# Patient Record
Sex: Female | Born: 1950
Health system: Southern US, Community
[De-identification: ages and names within clinical notes are randomized; demographics above are authoritative.]

## PROBLEM LIST (undated history)

## (undated) DIAGNOSIS — I10 Essential (primary) hypertension: Secondary | ICD-10-CM

## (undated) DIAGNOSIS — J45909 Unspecified asthma, uncomplicated: Secondary | ICD-10-CM

## (undated) DIAGNOSIS — K219 Gastro-esophageal reflux disease without esophagitis: Secondary | ICD-10-CM

## (undated) DIAGNOSIS — K649 Unspecified hemorrhoids: Secondary | ICD-10-CM

## (undated) DIAGNOSIS — E119 Type 2 diabetes mellitus without complications: Secondary | ICD-10-CM

## (undated) DIAGNOSIS — K5730 Diverticulosis of large intestine without perforation or abscess without bleeding: Secondary | ICD-10-CM

## (undated) DIAGNOSIS — N189 Chronic kidney disease, unspecified: Secondary | ICD-10-CM

## (undated) DIAGNOSIS — M1990 Unspecified osteoarthritis, unspecified site: Secondary | ICD-10-CM

## (undated) DIAGNOSIS — B009 Herpesviral infection, unspecified: Secondary | ICD-10-CM

## (undated) DIAGNOSIS — R7303 Prediabetes: Secondary | ICD-10-CM

## (undated) DIAGNOSIS — N816 Rectocele: Secondary | ICD-10-CM

## (undated) DIAGNOSIS — F419 Anxiety disorder, unspecified: Secondary | ICD-10-CM

## (undated) DIAGNOSIS — Z9889 Other specified postprocedural states: Secondary | ICD-10-CM

## (undated) DIAGNOSIS — K5909 Other constipation: Secondary | ICD-10-CM

## (undated) DIAGNOSIS — F329 Major depressive disorder, single episode, unspecified: Secondary | ICD-10-CM

## (undated) DIAGNOSIS — G629 Polyneuropathy, unspecified: Secondary | ICD-10-CM

## (undated) DIAGNOSIS — R0600 Dyspnea, unspecified: Secondary | ICD-10-CM

## (undated) DIAGNOSIS — D649 Anemia, unspecified: Secondary | ICD-10-CM

## (undated) HISTORY — PX: BREAST SURGERY: SHX581

## (undated) HISTORY — DX: Gastro-esophageal reflux disease without esophagitis: K21.9

## (undated) HISTORY — DX: Herpesviral infection, unspecified: B00.9

## (undated) HISTORY — DX: Essential (primary) hypertension: I10

## (undated) HISTORY — PX: OTHER SURGICAL HISTORY: SHX169

## (undated) HISTORY — PX: CATARACT EXTRACTION: SUR2

## (undated) HISTORY — PX: VAGINAL HYSTERECTOMY: SUR661

---

## 1968-11-13 HISTORY — PX: HEMORRHOID SURGERY: SHX153

## 1968-11-13 HISTORY — PX: FRACTURE SURGERY: SHX138

## 2001-11-18 ENCOUNTER — Ambulatory Visit (HOSPITAL_COMMUNITY): Admission: RE | Admit: 2001-11-18 | Discharge: 2001-11-18 | Payer: Self-pay | Admitting: Internal Medicine

## 2007-01-17 ENCOUNTER — Ambulatory Visit (HOSPITAL_COMMUNITY): Admission: RE | Admit: 2007-01-17 | Discharge: 2007-01-17 | Payer: Self-pay | Admitting: General Surgery

## 2011-06-22 DIAGNOSIS — D369 Benign neoplasm, unspecified site: Secondary | ICD-10-CM | POA: Insufficient documentation

## 2011-07-06 ENCOUNTER — Encounter (INDEPENDENT_AMBULATORY_CARE_PROVIDER_SITE_OTHER): Payer: Self-pay | Admitting: Surgery

## 2011-07-06 ENCOUNTER — Ambulatory Visit (INDEPENDENT_AMBULATORY_CARE_PROVIDER_SITE_OTHER): Payer: BC Managed Care – PPO | Admitting: Surgery

## 2011-07-06 DIAGNOSIS — D369 Benign neoplasm, unspecified site: Secondary | ICD-10-CM

## 2011-07-06 NOTE — Patient Instructions (Signed)
We will arrange for surgery to remove the abnormality in your right breast

## 2011-07-06 NOTE — Progress Notes (Signed)
Chief Complaint  Patient presents with  . Breast Problem    HPI Candice [name] is a 60 y.o. female.  She presents with a diagnosis of a papilloma in the right breast. She had a recent mammogram done which showed a subtle abnormality, and a core biopsy was done. This showed a papilloma.  In 2008 she had a mass removed from the right breast circumareolar area. This was associated with nipple discharge and was thought to be a papilloma although pathology suggested features of a fibroadenoma.  There is no nipple discharge or any other breast symptoms currently noted. HPI  Past Medical History  Diagnosis Date  . Herpes   . Hypertension     Past Surgical History  Procedure Date  . Abdominal hysterectomy   . Breast surgery   . Hemorrhoid surgery 1970    History reviewed. No pertinent family history.  Social History History  Substance Use Topics  . Smoking status: Former Games developer  . Smokeless tobacco: Never Used  . Alcohol Use: No    Allergies  Allergen Reactions  . Codeine Nausea Only  . Sulfur Rash    Current Outpatient Prescriptions  Medication Sig Dispense Refill  . acyclovir (ZOVIRAX) 400 MG tablet Take 400 mg by mouth every 4 (four) hours while awake.        . celecoxib (CELEBREX) 200 MG capsule Take 200 mg by mouth 2 (two) times daily.        . clonazePAM (KLONOPIN) 0.5 MG tablet Take 0.5 mg by mouth as needed.        Marland Kitchen olmesartan-hydrochlorothiazide (BENICAR HCT) 20-12.5 MG per tablet Take 1 tablet by mouth daily.        . pantoprazole (PROTONIX) 40 MG tablet Take 40 mg by mouth as needed.          Review of Systems ROS.Her 12 point review of systems was completely negative except for some issues with her weight and mild hypertension  Blood pressure 124/80, pulse 60, temperature 97.8 F (36.6 C), temperature source Temporal, height 5\' 7"  (1.702 m), weight 202 lb 3.2 oz (91.717 kg).  Physical Exam Physical Exam GENERAL: The patient is alert, oriented, and  generally healthy-appearing, NAD. Mood and affect are normal.  HEENT: The head is normocephalic, the eyes nonicteric, the pupils were round regular and equal. EOMs are normal. Pharynx normal. Dentition good.  NECK: The neck is supple and there are no masses or thyromegaly.  LUNGS: Normal respirations and clear to auscultation.  HEART: Regular rhythm, with no murmurs rubs or gallops. Pulses are intact carotid dorsalis pedis and posterior tibial. No significant varicosities are noted.  BREASTS: The breasts are symmetric in appearance. There is a mild bruise in the upper outer quadrant of the right breast. There is no palpable mass or other abnormality. There is no nipple discharge.  ABDOMEN: Soft, flat, and nontender. No masses or organomegaly is noted. No hernias are noted. Bowel sounds are normal.  EXTREMITIES: Good range of motion, no edema.  Data Reviewed I have reviewed her mammogram reports and films and the pathologist report showing intraductal papilloma  Assessment    papilloma right breast upper outer quadrant    Plan    I think she'll need a needle guided excisional biopsy. I discussed that with the patient and reviewed the risks and complications. I think all questions have been answered. I have told her that this most likely is completely benign. It is similar to what we thought she had previously but I  think a different locations so not a recurrence of the previous papilloma.       Mattison Stuckey J 07/06/2011, 3:35 PM

## 2011-07-10 ENCOUNTER — Encounter (HOSPITAL_BASED_OUTPATIENT_CLINIC_OR_DEPARTMENT_OTHER)
Admission: RE | Admit: 2011-07-10 | Discharge: 2011-07-10 | Disposition: A | Discharge status: Home / Self Care | Payer: BC Managed Care – PPO | Source: Ambulatory Visit | Attending: Surgery | Admitting: Surgery

## 2011-07-10 LAB — BASIC METABOLIC PANEL
BUN: 19 mg/dL (ref 6–23)
CO2: 29 mEq/L (ref 19–32)
Calcium: 10 mg/dL (ref 8.4–10.5)
Chloride: 104 mEq/L (ref 96–112)
Creatinine, Ser: 0.66 mg/dL (ref 0.50–1.10)
GFR calc Af Amer: >60 mL/min (ref >60)
GFR calc non Af Amer: >60 mL/min (ref >60)
Glucose, Bld: 83 mg/dL (ref 70–99)
Potassium: 4.1 mEq/L (ref 3.5–5.1)
Sodium: 138 mEq/L (ref 135–145)

## 2011-07-13 ENCOUNTER — Ambulatory Visit (HOSPITAL_BASED_OUTPATIENT_CLINIC_OR_DEPARTMENT_OTHER)
Admission: RE | Admit: 2011-07-13 | Discharge: 2011-07-13 | Disposition: A | Discharge status: Home / Self Care | Payer: BC Managed Care – PPO | Source: Ambulatory Visit | Attending: Surgery | Admitting: Surgery

## 2011-07-13 ENCOUNTER — Other Ambulatory Visit (INDEPENDENT_AMBULATORY_CARE_PROVIDER_SITE_OTHER): Payer: Self-pay | Admitting: Surgery

## 2011-07-13 DIAGNOSIS — Z01812 Encounter for preprocedural laboratory examination: Secondary | ICD-10-CM | POA: Insufficient documentation

## 2011-07-13 DIAGNOSIS — I10 Essential (primary) hypertension: Secondary | ICD-10-CM | POA: Insufficient documentation

## 2011-07-13 DIAGNOSIS — E669 Obesity, unspecified: Secondary | ICD-10-CM | POA: Insufficient documentation

## 2011-07-13 DIAGNOSIS — D249 Benign neoplasm of unspecified breast: Secondary | ICD-10-CM | POA: Insufficient documentation

## 2011-07-13 DIAGNOSIS — Z01810 Encounter for preprocedural cardiovascular examination: Secondary | ICD-10-CM | POA: Insufficient documentation

## 2011-07-13 LAB — POCT HEMOGLOBIN-HEMACUE: Hemoglobin: 12.3 g/dL (ref 12.0–15.0)

## 2011-07-16 NOTE — Op Note (Signed)
  NAME:  Candice [name], Candice [name] NO.:  [id number]  MEDICAL RECORD NO.:  [id number]  LOCATION:                                 FACILITY:  PHYSICIAN:  Currie Paris, M.D.   DATE OF BIRTH:  DATE OF PROCEDURE:  07/13/2011 DATE OF DISCHARGE:                              OPERATIVE REPORT   PREOPERATIVE DIAGNOSIS:  Right breast mass, probable papilloma.  POSTOPERATIVE DIAGNOSIS:  Right breast mass, probable papilloma.  PROCEDURE:  Needle-guided excision, right breast mass.  SURGEON:  Currie Paris, MD  ANESTHESIA:  General.  CLINICAL HISTORY:  This is a 60 year old lady who had an abnormality in the right breast biopsied and this appeared to be papilloma, but excisional biopsy was recommended.  DESCRIPTION OF PROCEDURE:  The patient was seen in the holding area and she had no further questions.  We identified and marked the right breast as the operative site.  I reviewed the wire localization films.  The patient was taken to the operating room and after satisfactory general anesthesia had been obtained, the breast was prepped and draped and the time-out was performed.  The guidewire entered just lateral and inferior to the areolar margin and went superiorly.  It was close enough to the areolar margin that I thought a circumareolar incision would give adequate exposure and best cosmetic result.  I then made the curvilinear incision.  I elevated skin flap inferior laterally, so I could manipulate the guidewire into the wound.  I then took a somewhat cylindrical piece of tissue around the guidewire going from inferior to superior along the guidewire tract.  When it had gone a few centimeters, I was able to grasp the tissue for better traction and then completed the excision going beyond the tip of the guidewire. There was a little bit of ecchymosis along the lateral edge, so I made sure to take all of that because I thought this was the prior  biopsy tract.  I made sure everything was dry.  I irrigated.  I put in 0.25% plain Marcaine for postop pain relief.  I closed in layers with 3-0 Vicryl, 4- 0 Monocryl subcuticular, Steri-Strips, and Dermabond.  The patient tolerated the procedure well.  There were no operative complications. All counts were correct.     Currie Paris, M.D.     CJS/MEDQ  D:  07/13/2011  T:  07/13/2011  Job:  [id number]  cc:   Fara Chute, MD  Electronically Signed by Cyndia Bent M.D. on 07/16/2011 04:54:09 AM

## 2011-07-18 ENCOUNTER — Telehealth (INDEPENDENT_AMBULATORY_CARE_PROVIDER_SITE_OTHER): Payer: Self-pay | Admitting: General Surgery

## 2011-07-18 NOTE — Telephone Encounter (Signed)
Patient made aware that her path is benign. She will keep her follow up appt on 07/25/11 and call with any problems.

## 2011-07-18 NOTE — Telephone Encounter (Signed)
Left message for patient to call back and ask for me. Calling to make patient aware her path is benign.

## 2011-07-18 NOTE — Telephone Encounter (Signed)
Message copied by Liliana Cline on Tue Jul 18, 2011 10:19 AM ------      Message from: Currie Paris      Created: Sat Jul 15, 2011  9:13 AM       Tell her path is benign and as expected

## 2011-07-25 ENCOUNTER — Ambulatory Visit (INDEPENDENT_AMBULATORY_CARE_PROVIDER_SITE_OTHER): Payer: BC Managed Care – PPO | Admitting: Surgery

## 2011-07-25 ENCOUNTER — Encounter (INDEPENDENT_AMBULATORY_CARE_PROVIDER_SITE_OTHER): Payer: Self-pay | Admitting: Surgery

## 2011-07-25 VITALS — BP 140/90 | HR 70

## 2011-07-25 DIAGNOSIS — Z9889 Other specified postprocedural states: Secondary | ICD-10-CM

## 2011-07-25 NOTE — Patient Instructions (Signed)
We will see you back as needed. You should continue to have annual mammograms

## 2011-07-25 NOTE — Progress Notes (Signed)
NAME: Candice [name]                                            DOB: Feb 11, 1951 DATE: 07/25/2011                                                  MRN: [id number]  CC: Post op excision breast mass  HPI: This patient comes in for post op follow-up.Sheunderwent lumpectomy on 07/13/11. She feels that she is doing well.  PE: General: The patient appears to be healthy, NAD Breast: The incision is healing and there is no evidence of hematoma or infections  DATA REVIEWED: Pathology report shows papilloma with negative margins of excision  IMPRESSION: The patient is doing well S/P removal of right breast papilloma.    PLAN: RTC PRN

## 2011-10-31 ENCOUNTER — Encounter (INDEPENDENT_AMBULATORY_CARE_PROVIDER_SITE_OTHER): Payer: Self-pay | Admitting: Surgery

## 2011-11-28 ENCOUNTER — Encounter: Payer: Self-pay | Admitting: Internal Medicine

## 2012-10-09 ENCOUNTER — Encounter (INDEPENDENT_AMBULATORY_CARE_PROVIDER_SITE_OTHER): Payer: Self-pay | Admitting: *Deleted

## 2012-10-14 ENCOUNTER — Other Ambulatory Visit (INDEPENDENT_AMBULATORY_CARE_PROVIDER_SITE_OTHER): Payer: Self-pay | Admitting: *Deleted

## 2012-10-14 ENCOUNTER — Encounter (INDEPENDENT_AMBULATORY_CARE_PROVIDER_SITE_OTHER): Payer: Self-pay | Admitting: Internal Medicine

## 2012-10-14 ENCOUNTER — Ambulatory Visit (INDEPENDENT_AMBULATORY_CARE_PROVIDER_SITE_OTHER): Payer: BC Managed Care – PPO | Admitting: Internal Medicine

## 2012-10-14 ENCOUNTER — Telehealth (INDEPENDENT_AMBULATORY_CARE_PROVIDER_SITE_OTHER): Payer: Self-pay | Admitting: *Deleted

## 2012-10-14 VITALS — BP 142/60 | HR 84 | Temp 98.4°F | Ht 66.0 in | Wt 209.7 lb

## 2012-10-14 DIAGNOSIS — Z1211 Encounter for screening for malignant neoplasm of colon: Secondary | ICD-10-CM

## 2012-10-14 DIAGNOSIS — D509 Iron deficiency anemia, unspecified: Secondary | ICD-10-CM

## 2012-10-14 MED ORDER — PEG-KCL-NACL-NASULF-NA ASC-C 100 G PO SOLR: 1.0000 | Freq: Once | ORAL | Status: DC

## 2012-10-14 NOTE — Progress Notes (Signed)
Subjective:     Patient ID: Candice [name], female   DOB: [birth year], 61 y.o.   MRN: [id number]  HPI Referred to our office by Dr. Neita Carp for anemia/requesting EGD/Colonoscopy.  Appetite is good. No unintentional weight. No dysphagia.No epigastric pain. No acid reflux. No lower abdominal pain.  She has a BM daily. Usually has about 3. Stools are normal size. No melena or bright red rectal bleeding.  She tells me that sometimes she has to put her finger in her vagina to have a BM. She started the B12 shots on her own.  11/31/2013 H and H 9.6 and 29.8, MCV 82,  11/4/20213Iron binding 404, UIBC 372, Iron 32, Iron sat 8, Vitamin B12 greater than 1999, Folate greater than 19.9, Ferritin 9.   12/26/2007 H and H 13.1 and 39.3, MCV 94. Colonoscopy: average risk screening colonoscopy 07/27/2012:  Mild changes of melanosis coli, no evidence of colonic polyps. 5mm submucosal lipoma at hepatic flexure which was left along.  Review of Systems see hpi Current Outpatient Prescriptions  Medication Sig Dispense Refill  . cetirizine (ZYRTEC) 10 MG tablet Take 10 mg by mouth daily.      . cholecalciferol (VITAMIN D) 1000 UNITS tablet Take 1,000 Units by mouth daily.      . clonazePAM (KLONOPIN) 0.5 MG tablet Take 0.5 mg by mouth as needed.        Marland Kitchen co-enzyme Q-10 30 MG capsule Take 30 mg by mouth 3 (three) times daily.      . ferrous sulfate 325 (65 FE) MG tablet Take 325 mg by mouth daily with breakfast.      . fish oil-omega-3 fatty acids 1000 MG capsule Take 2 g by mouth daily.      . Multiple Vitamin (MULTIVITAMIN) tablet Take 1 tablet by mouth daily.      Marland Kitchen olmesartan-hydrochlorothiazide (BENICAR HCT) 20-12.5 MG per tablet Take 1 tablet by mouth daily.        Marland Kitchen orlistat (XENICAL) 120 MG capsule Take 120 mg by mouth 3 (three) times daily with meals.      . pantoprazole (PROTONIX) 40 MG tablet Take 40 mg by mouth as needed.         Past Medical History  Diagnosis Date  . Herpes   . Hypertension   .  GERD (gastroesophageal reflux disease)    Past Surgical History  Procedure Date  . Abdominal hysterectomy   . Hemorrhoid surgery 1970  . Breast surgery 01/17/2007    Right Dr Kendrick Ranch  . Breast biopsy 07/13/11    Right - Dr Jamey Ripa   Allergies  Allergen Reactions  . Codeine Nausea Only  . Sulfur Rash        Objective:   Physical Exam  Filed Vitals:   10/14/12 1511  BP: 142/60  Pulse: 84  Temp: 98.4 F (36.9 C)  Height: 5\' 6"  (1.676 m)  Weight: 209 lb 11.2 oz (95.119 kg)   Alert and oriented. Skin warm and dry. Oral mucosa is moist.   . Sclera anicteric, conjunctivae is pink. Thyroid not enlarged. No cervical lymphadenopathy. Lungs clear. Heart regular rate and rhythm.  Abdomen is soft. Bowel sounds are positive. No hepatomegaly. No abdominal masses felt. No tenderness.  No edema to lower extremities. Stool small amount: guaiac negative.     Assessment:   Iron deficiency Anemia: Colonic neoplasm needs to be ruled out. Colonoscopy. If normal EGD looking for PUD, carcinoma   Plan:    Colonoscopy/EGD .The risks and benefits such  as perforation, bleeding, and infection were reviewed with the patient and is agreeable.

## 2012-10-14 NOTE — Telephone Encounter (Signed)
Patient needs movi prep 

## 2012-10-14 NOTE — Patient Instructions (Addendum)
Colonoscopy/EGD with Dr. Rehman. The risks and benefits such as perforation, bleeding, and infection were reviewed with the patient and is agreeable.  

## 2012-10-21 ENCOUNTER — Encounter (HOSPITAL_COMMUNITY): Payer: Self-pay | Admitting: Pharmacy Technician

## 2012-10-21 ENCOUNTER — Telehealth (INDEPENDENT_AMBULATORY_CARE_PROVIDER_SITE_OTHER): Payer: Self-pay | Admitting: *Deleted

## 2012-10-21 NOTE — Telephone Encounter (Signed)
You saw patient on 10/14/12 and order TCS poss EGD, she is now questioning why she needs this if she just had it done in 2011, please call her at [phone]

## 2012-10-21 NOTE — Telephone Encounter (Signed)
I discussed this with patient. I advised her she needs a colonoscopy and EGD

## 2012-10-24 ENCOUNTER — Encounter (INDEPENDENT_AMBULATORY_CARE_PROVIDER_SITE_OTHER): Payer: Self-pay

## 2012-10-29 MED ORDER — SODIUM CHLORIDE 0.45 % IV SOLN
INTRAVENOUS | Status: DC
  Administered 2012-10-30: 13:00:00 via INTRAVENOUS

## 2012-10-30 ENCOUNTER — Encounter (HOSPITAL_COMMUNITY): Payer: Self-pay

## 2012-10-30 ENCOUNTER — Encounter (HOSPITAL_COMMUNITY)
Admission: RE | Disposition: A | Discharge status: Home / Self Care | Payer: Self-pay | Source: Ambulatory Visit | Attending: Internal Medicine

## 2012-10-30 ENCOUNTER — Ambulatory Visit (HOSPITAL_COMMUNITY)
Admission: RE | Admit: 2012-10-30 | Discharge: 2012-10-30 | Disposition: A | Discharge status: Home / Self Care | Payer: BC Managed Care – PPO | Source: Ambulatory Visit | Attending: Internal Medicine | Admitting: Internal Medicine

## 2012-10-30 DIAGNOSIS — K219 Gastro-esophageal reflux disease without esophagitis: Secondary | ICD-10-CM | POA: Insufficient documentation

## 2012-10-30 DIAGNOSIS — K644 Residual hemorrhoidal skin tags: Secondary | ICD-10-CM

## 2012-10-30 DIAGNOSIS — D509 Iron deficiency anemia, unspecified: Secondary | ICD-10-CM

## 2012-10-30 DIAGNOSIS — I10 Essential (primary) hypertension: Secondary | ICD-10-CM | POA: Insufficient documentation

## 2012-10-30 DIAGNOSIS — K449 Diaphragmatic hernia without obstruction or gangrene: Secondary | ICD-10-CM | POA: Insufficient documentation

## 2012-10-30 DIAGNOSIS — D1779 Benign lipomatous neoplasm of other sites: Secondary | ICD-10-CM | POA: Insufficient documentation

## 2012-10-30 DIAGNOSIS — K6389 Other specified diseases of intestine: Secondary | ICD-10-CM

## 2012-10-30 HISTORY — PX: COLONOSCOPY: SHX5424

## 2012-10-30 HISTORY — PX: ESOPHAGOGASTRODUODENOSCOPY: SHX5428

## 2012-10-30 SURGERY — COLONOSCOPY
Anesthesia: Moderate Sedation

## 2012-10-30 MED ORDER — STERILE WATER FOR IRRIGATION IR SOLN
Status: DC | PRN
  Administered 2012-10-30: 14:00:00

## 2012-10-30 MED ORDER — MEPERIDINE HCL 50 MG/ML IJ SOLN
INTRAMUSCULAR | Status: DC | PRN
  Administered 2012-10-30 (×4): 25 mg via INTRAVENOUS

## 2012-10-30 MED ORDER — CELECOXIB 100 MG PO CAPS: 100.0000 mg | ORAL_CAPSULE | Freq: Every day | ORAL | Status: DC | PRN

## 2012-10-30 MED ORDER — BUTAMBEN-TETRACAINE-BENZOCAINE 2-2-14 % EX AERO
INHALATION_SPRAY | CUTANEOUS | Status: DC | PRN
  Administered 2012-10-30: 2 via TOPICAL

## 2012-10-30 MED ORDER — MEPERIDINE HCL 50 MG/ML IJ SOLN
INTRAMUSCULAR | Status: AC
  Filled 2012-10-30: qty 1

## 2012-10-30 MED ORDER — MIDAZOLAM HCL 5 MG/5ML IJ SOLN
INTRAMUSCULAR | Status: DC | PRN
  Administered 2012-10-30 (×5): 2 mg via INTRAVENOUS

## 2012-10-30 MED ORDER — MIDAZOLAM HCL 5 MG/5ML IJ SOLN
INTRAMUSCULAR | Status: AC
  Filled 2012-10-30: qty 10

## 2012-10-30 NOTE — H&P (Signed)
Candice [name] is an 61 y.o. female.   Chief Complaint: Patient is here for colonoscopy to be followed by EGD if colonoscopy is negative. HPI: Patient 51-year-old African female who presents with iron deficiency anemia. She did have colonoscopy by me over 2 years ago and was normal. She is undergoing repeat examination to make sure nothing was missed on her last exam. She is on Xenical and generally has 3 soft stools a day. She denies abdominal pain melena or rectal bleeding. Patient had been on Celebrex until it was discontinued by Dr. Neita Carp. However she has not experienced any side effects with that medication.   Past Medical History  Diagnosis Date  . Herpes   . Hypertension   . GERD (gastroesophageal reflux disease)     Past Surgical History  Procedure Date  . Abdominal hysterectomy   . Hemorrhoid surgery 1970  . Breast surgery 01/17/2007    Right Dr Kendrick Ranch  . Breast biopsy 07/13/11    Right - Dr Jamey Ripa    History reviewed. No pertinent family history. Social History:  reports that she has quit smoking. She has never used smokeless tobacco. She reports that she does not drink alcohol or use illicit drugs.  Allergies:  Allergies  Allergen Reactions  . Codeine Nausea Only  . Sulfur Rash    Medications Prior to Admission  Medication Sig Dispense Refill  . cetirizine (ZYRTEC) 10 MG tablet Take 10 mg by mouth daily.      . cholecalciferol (VITAMIN D) 1000 UNITS tablet Take 1,000 Units by mouth daily.      . clonazePAM (KLONOPIN) 0.5 MG tablet Take 0.5 mg by mouth at bedtime as needed. Anxiety.      Marland Kitchen co-enzyme Q-10 30 MG capsule Take 30 mg by mouth 3 (three) times daily.      . ferrous sulfate 325 (65 FE) MG tablet Take 325 mg by mouth daily with breakfast.      . fish oil-omega-3 fatty acids 1000 MG capsule Take 2 g by mouth daily.      . Multiple Vitamin (MULTIVITAMIN) tablet Take 1 tablet by mouth daily.      Marland Kitchen olmesartan-hydrochlorothiazide (BENICAR HCT) 20-12.5 MG  per tablet Take 1 tablet by mouth daily.        Marland Kitchen orlistat (XENICAL) 120 MG capsule Take 120 mg by mouth 3 (three) times daily with meals.      . pantoprazole (PROTONIX) 40 MG tablet Take 40 mg by mouth daily. Heartburn      . peg 3350 powder (MOVIPREP) 100 G SOLR Take 1 kit (100 g total) by mouth once.  1 kit  0  . PROAIR HFA 108 (90 BASE) MCG/ACT inhaler Take 2 puffs by mouth 4 (four) times daily.         No results found for this or any previous visit (from the past 48 hour(s)). No results found.  ROS  Blood pressure 139/83, pulse 97, temperature 98.3 F (36.8 C), temperature source Oral, resp. rate 15, SpO2 100.00%. Physical Exam  Constitutional: She appears well-developed and well-nourished.  HENT:  Mouth/Throat: Oropharynx is clear and moist.  Eyes: Conjunctivae normal are normal. No scleral icterus.  Neck: No thyromegaly present.  Cardiovascular: Normal rate, regular rhythm and normal heart sounds.   No murmur heard. Respiratory: Effort normal and breath sounds normal.  GI: Soft. She exhibits no distension and no mass. There is no tenderness.  Musculoskeletal: She exhibits no edema.  Lymphadenopathy:    She has  no cervical adenopathy.  Neurological: She is alert.  Skin: Skin is warm and dry.     Assessment/Plan Iron deficiency anemia. Colonoscopy followed by EGD if colonoscopy is negative.  Jennika Ringgold U 10/30/2012, 1:49 PM

## 2012-10-30 NOTE — Progress Notes (Signed)
Discharge instructions given by Lonn Georgia RN.

## 2012-10-30 NOTE — Op Note (Signed)
EGD PROCEDURE REPORT  PATIENT:  Candice [name]  MR#:  [id number] Birthdate:  01-28-1951, 61 y.o., female Endoscopist:  Dr. Malissa Hippo, MD Referred By:  Dr. Fara Chute, MD Procedure Date: 10/30/2012  Procedure:    Colonoscopy followed by esophagogastroduodenoscopy.  Indications:  Patient is 61 year old African female with iron deficiency anemia. She has chronic GERD and her symptoms are well controlled with PPI. She had been on NSAID until was discontinued by Dr. Neita Carp. Her stool when she was recently seen in the office was guaiac negative. She did have screening colonoscopy in September 2011 was normal. She is undergoing colonoscopy followed by EGD if no lesion is found on colonoscopy.            Informed Consent:  The risks, benefits, alternatives & imponderables which include, but are not limited to, bleeding, infection, perforation, drug reaction and potential missed lesion have been reviewed.  The potential for biopsy, lesion removal, esophageal dilation, etc. have also been discussed.  Questions have been answered.  All parties agreeable.  Please see history & physical in medical record for more information.  Medications:  Demerol 100 mg IV Versed 10 mg IV Cetacaine spray topically for oropharyngeal anesthesia     COLONOSCOPY Description of procedure:  After a digital rectal exam was performed, that colonoscope was advanced from the anus through the rectum and colon to the area of the cecum, ileocecal valve and appendiceal orifice. The cecum was deeply intubated. These structures were well-seen and photographed for the record. From the level of the cecum and ileocecal valve, the scope was slowly and cautiously withdrawn. The mucosal surfaces were carefully surveyed utilizing scope tip to flexion to facilitate fold flattening as needed. The scope was pulled down into the rectum where a thorough exam including retroflexion was performed.  Findings:   Prep satisfactory. Small  submucosal lipoma at hepatic flexure. Normal mucosa throughout. No ulcers or other mucosal abnormalities noted. Normal rectal mucosa. Small hemorrhoids below the dentate line.  Therapeutic/Diagnostic Maneuvers Performed:  None  Complications:  None  Cecal Withdrawal Time:  8 minutes EGD  Description of procedure:  The endoscope was introduced through the mouth and advanced to the second portion of the duodenum without difficulty or limitations. The mucosal surfaces were surveyed very carefully during advancement of the scope and upon withdrawal.  Findings:  Esophagus:  Normal mucosa throughout. GEJ:  30 cm Hiatus:  36 cm Stomach:  , Was empty and distended very well with insufflation. Folds in the proximal stomach were normal. Examination mucosa revealed few hyperplastic polyps the gastric body which are left alone. No erosions or ulcers noted. Pyloric channel was patent. Angularis fundus and cardia were examined by retroflexing the scope over normal. Duodenum:  Normal bulbar and post bulbar mucosa.  Therapeutic/Diagnostic Maneuvers Performed:  None  Impression:  Normal colonoscopy except small submucosal lipoma at hepatic flexure and external hemorrhoids. Moderate size sliding hiatal hernia without evidence of erosive esophagitis. Few hyperplastic-appearing polyps in gastric body. These were left alone. No evidence of peptic ulcer disease. It is possible that she has impaired iron absorption while on PPI on may have been losing blood from small bowel(NSAID enteropathy).  Recommendations:  Patient will resume her usual medications including fat is sulfate. She will go back on Celebrex 100 mg by mouth daily when necessary. She will have H&H and serum ferritin in 8 weeks. If her H&H remains low or she has evidence of active bleeding will consider small bowel given capsule study.  Candice [name],Candice [name]  10/30/2012 2:51 PM  CC: Dr. Estanislado Pandy, MD & Dr. Bonnetta Barry ref. provider  found

## 2012-11-01 ENCOUNTER — Encounter (HOSPITAL_COMMUNITY): Payer: Self-pay | Admitting: Internal Medicine

## 2012-12-23 ENCOUNTER — Telehealth (INDEPENDENT_AMBULATORY_CARE_PROVIDER_SITE_OTHER): Payer: Self-pay | Admitting: *Deleted

## 2012-12-23 NOTE — Telephone Encounter (Addendum)
Candice [name] had a procedure done in December by Dr. Karilyn Cota and on the discharge paper it had for her to have a H&H serum ferritin. She would like to know what this is? Candice [name] also said this was written on rx paper. Told Candice [name] it was for her to have lab work done. Would like to speak with Tammy and the return phone number is [phone].

## 2012-12-24 ENCOUNTER — Telehealth (INDEPENDENT_AMBULATORY_CARE_PROVIDER_SITE_OTHER): Payer: Self-pay | Admitting: *Deleted

## 2012-12-24 DIAGNOSIS — D509 Iron deficiency anemia, unspecified: Secondary | ICD-10-CM

## 2012-12-24 NOTE — Telephone Encounter (Signed)
Labs that were ordered at time of procedure

## 2012-12-24 NOTE — Telephone Encounter (Signed)
Lab order faxed to [facility]. Patient called and a message was left on the patient's voicemail at home.

## 2012-12-27 LAB — HEMOGLOBIN AND HEMATOCRIT, BLOOD
HCT: 39.2 % (ref 36.0–46.0)
Hemoglobin: 12.9 g/dL (ref 12.0–15.0)

## 2012-12-28 LAB — FERRITIN: Ferritin: 23 ng/mL (ref 10–291)

## 2014-03-17 DIAGNOSIS — F411 Generalized anxiety disorder: Secondary | ICD-10-CM | POA: Diagnosis not present

## 2014-03-17 DIAGNOSIS — R7309 Other abnormal glucose: Secondary | ICD-10-CM | POA: Diagnosis not present

## 2014-03-17 DIAGNOSIS — E7800 Pure hypercholesterolemia, unspecified: Secondary | ICD-10-CM | POA: Diagnosis not present

## 2014-03-17 DIAGNOSIS — I10 Essential (primary) hypertension: Secondary | ICD-10-CM | POA: Diagnosis not present

## 2014-03-17 DIAGNOSIS — K2100 Gastro-esophageal reflux disease with esophagitis, without bleeding: Secondary | ICD-10-CM | POA: Diagnosis not present

## 2014-03-17 DIAGNOSIS — E049 Nontoxic goiter, unspecified: Secondary | ICD-10-CM | POA: Diagnosis not present

## 2014-03-17 DIAGNOSIS — E669 Obesity, unspecified: Secondary | ICD-10-CM | POA: Diagnosis not present

## 2014-03-19 DIAGNOSIS — E042 Nontoxic multinodular goiter: Secondary | ICD-10-CM | POA: Diagnosis not present

## 2014-03-23 DIAGNOSIS — E041 Nontoxic single thyroid nodule: Secondary | ICD-10-CM | POA: Diagnosis not present

## 2014-04-03 DIAGNOSIS — R300 Dysuria: Secondary | ICD-10-CM | POA: Diagnosis not present

## 2014-04-03 DIAGNOSIS — J040 Acute laryngitis: Secondary | ICD-10-CM | POA: Diagnosis not present

## 2014-07-30 DIAGNOSIS — Z23 Encounter for immunization: Secondary | ICD-10-CM | POA: Diagnosis not present

## 2015-02-12 DIAGNOSIS — E780 Pure hypercholesterolemia: Secondary | ICD-10-CM | POA: Diagnosis not present

## 2015-02-12 DIAGNOSIS — D539 Nutritional anemia, unspecified: Secondary | ICD-10-CM | POA: Diagnosis not present

## 2015-02-12 DIAGNOSIS — F331 Major depressive disorder, recurrent, moderate: Secondary | ICD-10-CM | POA: Diagnosis not present

## 2015-02-12 DIAGNOSIS — R5383 Other fatigue: Secondary | ICD-10-CM | POA: Diagnosis not present

## 2015-03-12 DIAGNOSIS — Z1231 Encounter for screening mammogram for malignant neoplasm of breast: Secondary | ICD-10-CM | POA: Diagnosis not present

## 2015-04-02 DIAGNOSIS — K210 Gastro-esophageal reflux disease with esophagitis: Secondary | ICD-10-CM | POA: Diagnosis not present

## 2015-04-02 DIAGNOSIS — I10 Essential (primary) hypertension: Secondary | ICD-10-CM | POA: Diagnosis not present

## 2015-04-02 DIAGNOSIS — R739 Hyperglycemia, unspecified: Secondary | ICD-10-CM | POA: Diagnosis not present

## 2015-04-02 DIAGNOSIS — D508 Other iron deficiency anemias: Secondary | ICD-10-CM | POA: Diagnosis not present

## 2015-04-02 DIAGNOSIS — E049 Nontoxic goiter, unspecified: Secondary | ICD-10-CM | POA: Diagnosis not present

## 2015-04-02 DIAGNOSIS — E780 Pure hypercholesterolemia: Secondary | ICD-10-CM | POA: Diagnosis not present

## 2015-04-06 DIAGNOSIS — I10 Essential (primary) hypertension: Secondary | ICD-10-CM | POA: Diagnosis not present

## 2015-04-06 DIAGNOSIS — E6609 Other obesity due to excess calories: Secondary | ICD-10-CM | POA: Diagnosis not present

## 2015-04-06 DIAGNOSIS — F411 Generalized anxiety disorder: Secondary | ICD-10-CM | POA: Diagnosis not present

## 2015-04-06 DIAGNOSIS — Z0001 Encounter for general adult medical examination with abnormal findings: Secondary | ICD-10-CM | POA: Diagnosis not present

## 2015-04-06 DIAGNOSIS — Z1389 Encounter for screening for other disorder: Secondary | ICD-10-CM | POA: Diagnosis not present

## 2015-04-06 DIAGNOSIS — D508 Other iron deficiency anemias: Secondary | ICD-10-CM | POA: Diagnosis not present

## 2015-04-06 DIAGNOSIS — E782 Mixed hyperlipidemia: Secondary | ICD-10-CM | POA: Diagnosis not present

## 2015-04-07 DIAGNOSIS — I10 Essential (primary) hypertension: Secondary | ICD-10-CM | POA: Diagnosis not present

## 2015-05-08 DIAGNOSIS — I10 Essential (primary) hypertension: Secondary | ICD-10-CM | POA: Diagnosis not present

## 2015-06-07 DIAGNOSIS — I10 Essential (primary) hypertension: Secondary | ICD-10-CM | POA: Diagnosis not present

## 2015-07-08 DIAGNOSIS — I10 Essential (primary) hypertension: Secondary | ICD-10-CM | POA: Diagnosis not present

## 2015-08-08 DIAGNOSIS — I10 Essential (primary) hypertension: Secondary | ICD-10-CM | POA: Diagnosis not present

## 2015-08-09 DIAGNOSIS — Z23 Encounter for immunization: Secondary | ICD-10-CM | POA: Diagnosis not present

## 2015-09-17 DIAGNOSIS — I10 Essential (primary) hypertension: Secondary | ICD-10-CM | POA: Diagnosis not present

## 2015-09-17 DIAGNOSIS — K210 Gastro-esophageal reflux disease with esophagitis: Secondary | ICD-10-CM | POA: Diagnosis not present

## 2015-09-17 DIAGNOSIS — E782 Mixed hyperlipidemia: Secondary | ICD-10-CM | POA: Diagnosis not present

## 2015-09-30 DIAGNOSIS — J45909 Unspecified asthma, uncomplicated: Secondary | ICD-10-CM | POA: Diagnosis not present

## 2015-09-30 DIAGNOSIS — R739 Hyperglycemia, unspecified: Secondary | ICD-10-CM | POA: Diagnosis not present

## 2015-09-30 DIAGNOSIS — E782 Mixed hyperlipidemia: Secondary | ICD-10-CM | POA: Diagnosis not present

## 2015-09-30 DIAGNOSIS — I10 Essential (primary) hypertension: Secondary | ICD-10-CM | POA: Diagnosis not present

## 2015-10-05 DIAGNOSIS — I10 Essential (primary) hypertension: Secondary | ICD-10-CM | POA: Diagnosis not present

## 2015-10-14 DIAGNOSIS — H2513 Age-related nuclear cataract, bilateral: Secondary | ICD-10-CM | POA: Diagnosis not present

## 2015-10-14 DIAGNOSIS — H524 Presbyopia: Secondary | ICD-10-CM | POA: Diagnosis not present

## 2015-10-14 DIAGNOSIS — H5203 Hypermetropia, bilateral: Secondary | ICD-10-CM | POA: Diagnosis not present

## 2015-10-14 DIAGNOSIS — H52223 Regular astigmatism, bilateral: Secondary | ICD-10-CM | POA: Diagnosis not present

## 2016-04-07 DIAGNOSIS — K210 Gastro-esophageal reflux disease with esophagitis: Secondary | ICD-10-CM | POA: Diagnosis not present

## 2016-04-07 DIAGNOSIS — E049 Nontoxic goiter, unspecified: Secondary | ICD-10-CM | POA: Diagnosis not present

## 2016-04-07 DIAGNOSIS — R739 Hyperglycemia, unspecified: Secondary | ICD-10-CM | POA: Diagnosis not present

## 2016-04-07 DIAGNOSIS — D539 Nutritional anemia, unspecified: Secondary | ICD-10-CM | POA: Diagnosis not present

## 2016-04-07 DIAGNOSIS — I10 Essential (primary) hypertension: Secondary | ICD-10-CM | POA: Diagnosis not present

## 2016-04-07 DIAGNOSIS — E7800 Pure hypercholesterolemia, unspecified: Secondary | ICD-10-CM | POA: Diagnosis not present

## 2016-04-07 DIAGNOSIS — F411 Generalized anxiety disorder: Secondary | ICD-10-CM | POA: Diagnosis not present

## 2016-04-07 DIAGNOSIS — D508 Other iron deficiency anemias: Secondary | ICD-10-CM | POA: Diagnosis not present

## 2016-04-07 DIAGNOSIS — E782 Mixed hyperlipidemia: Secondary | ICD-10-CM | POA: Diagnosis not present

## 2016-04-13 DIAGNOSIS — E782 Mixed hyperlipidemia: Secondary | ICD-10-CM | POA: Diagnosis not present

## 2016-04-13 DIAGNOSIS — R739 Hyperglycemia, unspecified: Secondary | ICD-10-CM | POA: Diagnosis not present

## 2016-04-13 DIAGNOSIS — J45909 Unspecified asthma, uncomplicated: Secondary | ICD-10-CM | POA: Diagnosis not present

## 2016-04-13 DIAGNOSIS — N8110 Cystocele, unspecified: Secondary | ICD-10-CM | POA: Diagnosis not present

## 2016-04-13 DIAGNOSIS — Z23 Encounter for immunization: Secondary | ICD-10-CM | POA: Diagnosis not present

## 2016-04-13 DIAGNOSIS — I10 Essential (primary) hypertension: Secondary | ICD-10-CM | POA: Diagnosis not present

## 2016-04-13 DIAGNOSIS — Z0000 Encounter for general adult medical examination without abnormal findings: Secondary | ICD-10-CM | POA: Diagnosis not present

## 2016-07-11 ENCOUNTER — Other Ambulatory Visit: Payer: Self-pay

## 2016-07-20 DIAGNOSIS — Z23 Encounter for immunization: Secondary | ICD-10-CM | POA: Diagnosis not present

## 2016-09-14 DIAGNOSIS — Z01411 Encounter for gynecological examination (general) (routine) with abnormal findings: Secondary | ICD-10-CM | POA: Diagnosis not present

## 2016-09-14 DIAGNOSIS — N819 Female genital prolapse, unspecified: Secondary | ICD-10-CM | POA: Diagnosis not present

## 2016-09-14 DIAGNOSIS — N816 Rectocele: Secondary | ICD-10-CM | POA: Diagnosis not present

## 2016-09-14 DIAGNOSIS — N8110 Cystocele, unspecified: Secondary | ICD-10-CM | POA: Diagnosis not present

## 2016-09-19 DIAGNOSIS — M25562 Pain in left knee: Secondary | ICD-10-CM | POA: Diagnosis not present

## 2016-09-19 DIAGNOSIS — M1711 Unilateral primary osteoarthritis, right knee: Secondary | ICD-10-CM | POA: Diagnosis not present

## 2016-09-19 DIAGNOSIS — M170 Bilateral primary osteoarthritis of knee: Secondary | ICD-10-CM | POA: Diagnosis not present

## 2016-09-19 DIAGNOSIS — M25561 Pain in right knee: Secondary | ICD-10-CM | POA: Diagnosis not present

## 2016-09-27 DIAGNOSIS — M25561 Pain in right knee: Secondary | ICD-10-CM | POA: Diagnosis not present

## 2016-09-27 DIAGNOSIS — M1711 Unilateral primary osteoarthritis, right knee: Secondary | ICD-10-CM | POA: Diagnosis not present

## 2016-09-28 DIAGNOSIS — K5909 Other constipation: Secondary | ICD-10-CM | POA: Diagnosis not present

## 2016-10-04 DIAGNOSIS — M25561 Pain in right knee: Secondary | ICD-10-CM | POA: Diagnosis not present

## 2016-10-04 DIAGNOSIS — M1711 Unilateral primary osteoarthritis, right knee: Secondary | ICD-10-CM | POA: Diagnosis not present

## 2016-10-12 DIAGNOSIS — M1711 Unilateral primary osteoarthritis, right knee: Secondary | ICD-10-CM | POA: Diagnosis not present

## 2016-10-12 DIAGNOSIS — M25561 Pain in right knee: Secondary | ICD-10-CM | POA: Diagnosis not present

## 2016-10-12 DIAGNOSIS — N8110 Cystocele, unspecified: Secondary | ICD-10-CM | POA: Diagnosis not present

## 2016-10-13 DIAGNOSIS — Z6833 Body mass index (BMI) 33.0-33.9, adult: Secondary | ICD-10-CM | POA: Diagnosis not present

## 2016-10-13 DIAGNOSIS — M25531 Pain in right wrist: Secondary | ICD-10-CM | POA: Diagnosis not present

## 2016-10-13 DIAGNOSIS — J069 Acute upper respiratory infection, unspecified: Secondary | ICD-10-CM | POA: Diagnosis not present

## 2016-10-13 DIAGNOSIS — M67441 Ganglion, right hand: Secondary | ICD-10-CM | POA: Diagnosis not present

## 2016-10-17 DIAGNOSIS — R739 Hyperglycemia, unspecified: Secondary | ICD-10-CM | POA: Diagnosis not present

## 2016-10-17 DIAGNOSIS — E782 Mixed hyperlipidemia: Secondary | ICD-10-CM | POA: Diagnosis not present

## 2016-10-17 DIAGNOSIS — I10 Essential (primary) hypertension: Secondary | ICD-10-CM | POA: Diagnosis not present

## 2016-10-17 DIAGNOSIS — K210 Gastro-esophageal reflux disease with esophagitis: Secondary | ICD-10-CM | POA: Diagnosis not present

## 2016-10-17 DIAGNOSIS — R5383 Other fatigue: Secondary | ICD-10-CM | POA: Diagnosis not present

## 2016-10-19 DIAGNOSIS — M1711 Unilateral primary osteoarthritis, right knee: Secondary | ICD-10-CM | POA: Diagnosis not present

## 2016-10-19 DIAGNOSIS — M25561 Pain in right knee: Secondary | ICD-10-CM | POA: Diagnosis not present

## 2016-10-20 DIAGNOSIS — I10 Essential (primary) hypertension: Secondary | ICD-10-CM | POA: Diagnosis not present

## 2016-10-20 DIAGNOSIS — J209 Acute bronchitis, unspecified: Secondary | ICD-10-CM | POA: Diagnosis not present

## 2016-10-20 DIAGNOSIS — E782 Mixed hyperlipidemia: Secondary | ICD-10-CM | POA: Diagnosis not present

## 2016-10-20 DIAGNOSIS — N8110 Cystocele, unspecified: Secondary | ICD-10-CM | POA: Diagnosis not present

## 2016-10-20 DIAGNOSIS — K210 Gastro-esophageal reflux disease with esophagitis: Secondary | ICD-10-CM | POA: Diagnosis not present

## 2016-10-20 DIAGNOSIS — R739 Hyperglycemia, unspecified: Secondary | ICD-10-CM | POA: Diagnosis not present

## 2016-10-20 DIAGNOSIS — J45909 Unspecified asthma, uncomplicated: Secondary | ICD-10-CM | POA: Diagnosis not present

## 2016-10-20 DIAGNOSIS — F411 Generalized anxiety disorder: Secondary | ICD-10-CM | POA: Diagnosis not present

## 2016-10-27 DIAGNOSIS — K760 Fatty (change of) liver, not elsewhere classified: Secondary | ICD-10-CM | POA: Diagnosis not present

## 2016-10-27 DIAGNOSIS — R748 Abnormal levels of other serum enzymes: Secondary | ICD-10-CM | POA: Diagnosis not present

## 2016-11-10 DIAGNOSIS — Z1231 Encounter for screening mammogram for malignant neoplasm of breast: Secondary | ICD-10-CM | POA: Diagnosis not present

## 2017-03-21 DIAGNOSIS — M25561 Pain in right knee: Secondary | ICD-10-CM | POA: Diagnosis not present

## 2017-03-21 DIAGNOSIS — M1711 Unilateral primary osteoarthritis, right knee: Secondary | ICD-10-CM | POA: Diagnosis not present

## 2017-03-21 DIAGNOSIS — R262 Difficulty in walking, not elsewhere classified: Secondary | ICD-10-CM | POA: Diagnosis not present

## 2017-03-28 DIAGNOSIS — M25561 Pain in right knee: Secondary | ICD-10-CM | POA: Diagnosis not present

## 2017-03-28 DIAGNOSIS — M1711 Unilateral primary osteoarthritis, right knee: Secondary | ICD-10-CM | POA: Diagnosis not present

## 2017-04-04 DIAGNOSIS — M25561 Pain in right knee: Secondary | ICD-10-CM | POA: Diagnosis not present

## 2017-04-04 DIAGNOSIS — M1711 Unilateral primary osteoarthritis, right knee: Secondary | ICD-10-CM | POA: Diagnosis not present

## 2017-05-19 DIAGNOSIS — Z6832 Body mass index (BMI) 32.0-32.9, adult: Secondary | ICD-10-CM | POA: Diagnosis not present

## 2017-05-19 DIAGNOSIS — J0101 Acute recurrent maxillary sinusitis: Secondary | ICD-10-CM | POA: Diagnosis not present

## 2017-06-04 DIAGNOSIS — I10 Essential (primary) hypertension: Secondary | ICD-10-CM | POA: Diagnosis not present

## 2017-06-04 DIAGNOSIS — E782 Mixed hyperlipidemia: Secondary | ICD-10-CM | POA: Diagnosis not present

## 2017-06-04 DIAGNOSIS — R739 Hyperglycemia, unspecified: Secondary | ICD-10-CM | POA: Diagnosis not present

## 2017-06-04 DIAGNOSIS — K210 Gastro-esophageal reflux disease with esophagitis: Secondary | ICD-10-CM | POA: Diagnosis not present

## 2017-06-06 DIAGNOSIS — N8110 Cystocele, unspecified: Secondary | ICD-10-CM | POA: Diagnosis not present

## 2017-06-06 DIAGNOSIS — Z0000 Encounter for general adult medical examination without abnormal findings: Secondary | ICD-10-CM | POA: Diagnosis not present

## 2017-06-06 DIAGNOSIS — I10 Essential (primary) hypertension: Secondary | ICD-10-CM | POA: Diagnosis not present

## 2017-06-06 DIAGNOSIS — Z6833 Body mass index (BMI) 33.0-33.9, adult: Secondary | ICD-10-CM | POA: Diagnosis not present

## 2017-06-06 DIAGNOSIS — J209 Acute bronchitis, unspecified: Secondary | ICD-10-CM | POA: Diagnosis not present

## 2017-06-06 DIAGNOSIS — K210 Gastro-esophageal reflux disease with esophagitis: Secondary | ICD-10-CM | POA: Diagnosis not present

## 2017-06-06 DIAGNOSIS — J45909 Unspecified asthma, uncomplicated: Secondary | ICD-10-CM | POA: Diagnosis not present

## 2017-06-06 DIAGNOSIS — R739 Hyperglycemia, unspecified: Secondary | ICD-10-CM | POA: Diagnosis not present

## 2017-06-13 LAB — IFOBT (OCCULT BLOOD): IFOBT: POSITIVE

## 2017-06-21 ENCOUNTER — Encounter (INDEPENDENT_AMBULATORY_CARE_PROVIDER_SITE_OTHER): Payer: Self-pay | Admitting: Internal Medicine

## 2017-06-21 ENCOUNTER — Encounter (INDEPENDENT_AMBULATORY_CARE_PROVIDER_SITE_OTHER): Payer: Self-pay

## 2017-06-25 DIAGNOSIS — M722 Plantar fascial fibromatosis: Secondary | ICD-10-CM | POA: Diagnosis not present

## 2017-06-25 DIAGNOSIS — M79672 Pain in left foot: Secondary | ICD-10-CM | POA: Diagnosis not present

## 2017-06-25 DIAGNOSIS — M79671 Pain in right foot: Secondary | ICD-10-CM | POA: Diagnosis not present

## 2017-06-25 DIAGNOSIS — M25579 Pain in unspecified ankle and joints of unspecified foot: Secondary | ICD-10-CM | POA: Diagnosis not present

## 2017-06-28 DIAGNOSIS — N816 Rectocele: Secondary | ICD-10-CM | POA: Diagnosis not present

## 2017-07-05 ENCOUNTER — Encounter (INDEPENDENT_AMBULATORY_CARE_PROVIDER_SITE_OTHER): Payer: Self-pay | Admitting: *Deleted

## 2017-07-05 ENCOUNTER — Other Ambulatory Visit (INDEPENDENT_AMBULATORY_CARE_PROVIDER_SITE_OTHER): Payer: Self-pay | Admitting: Internal Medicine

## 2017-07-05 ENCOUNTER — Encounter (INDEPENDENT_AMBULATORY_CARE_PROVIDER_SITE_OTHER): Payer: Self-pay | Admitting: Internal Medicine

## 2017-07-05 ENCOUNTER — Telehealth (INDEPENDENT_AMBULATORY_CARE_PROVIDER_SITE_OTHER): Payer: Self-pay | Admitting: *Deleted

## 2017-07-05 ENCOUNTER — Ambulatory Visit (INDEPENDENT_AMBULATORY_CARE_PROVIDER_SITE_OTHER): Payer: Medicare Other | Admitting: Internal Medicine

## 2017-07-05 VITALS — BP 130/80 | HR 60 | Temp 97.8°F | Ht 66.5 in | Wt 202.8 lb

## 2017-07-05 DIAGNOSIS — R195 Other fecal abnormalities: Secondary | ICD-10-CM

## 2017-07-05 MED ORDER — PEG 3350-KCL-NA BICARB-NACL 420 G PO SOLR: 4000.0000 mL | Freq: Once | ORAL | 0 refills | Status: AC

## 2017-07-05 NOTE — Telephone Encounter (Signed)
Patient needs trilyte 

## 2017-07-05 NOTE — Patient Instructions (Addendum)
The risks of bleeding, perforation and infection were reviewed with patient.  

## 2017-07-05 NOTE — Progress Notes (Addendum)
Subjective:    Patient ID: Candice [name], female    DOB: December 25, 1950, 66 y.o.   MRN: [id number]   HPI Referred by Dr Quintin Alto for positive stool card. She denies seeing any blood.  Stools are brown in color. No melena. She tells me she has a rectal prolapse and is seeing a Psychologist, sport and exercise  At [facility].  Her appetite is good. No weight loss. Usually has a BM daily. She occasionally sees blood if she becomes constipated.  06/13/2017 Positive stool card.   10/30/2012 Colonoscopy: IDA, Chronic GERD.     Impression:  Normal colonoscopy except small submucosal lipoma at hepatic flexure and external hemorrhoids. Moderate size sliding hiatal hernia without evidence of erosive esophagitis. Few hyperplastic-appearing polyps in gastric body. These were left alone. No evidence of peptic ulcer disease. It is possible that she has impaired iron absorption while on PPI on may have been losing blood from small bowel(NSAID enteropathy).   06/04/2017 H and H 11.5 and 35.6.  Review of Systems Past Medical History:  Diagnosis Date  . GERD (gastroesophageal reflux disease)   . Herpes   . Hypertension     Past Surgical History:  Procedure Laterality Date  . ABDOMINAL HYSTERECTOMY    . BREAST BIOPSY  07/13/11   Right - Dr Margot Chimes  . BREAST SURGERY  01/17/2007   Right Dr Lennie Hummer  . COLONOSCOPY  10/30/2012   Procedure: COLONOSCOPY;  Surgeon: Rogene Houston, MD;  Location: [facility];  Service: Endoscopy;  Laterality: N/A;  200  . ESOPHAGOGASTRODUODENOSCOPY  10/30/2012   Procedure: ESOPHAGOGASTRODUODENOSCOPY (EGD);  Surgeon: Rogene Houston, MD;  Location: [facility];  Service: Endoscopy;  Laterality: N/A;  . HEMORRHOID SURGERY  1970    Allergies  Allergen Reactions  . Codeine Nausea Only  . Sulfur Rash    Current Outpatient Prescriptions on File Prior to Visit  Medication Sig Dispense Refill  . celecoxib (CELEBREX) 100 MG capsule Take 1 capsule (100 mg total) by mouth  daily as needed for pain. 30 capsule 3  . cholecalciferol (VITAMIN D) 1000 UNITS tablet Take 1,000 Units by mouth daily.    . clonazePAM (KLONOPIN) 0.5 MG tablet Take 0.5 mg by mouth at bedtime as needed. Anxiety.    Marland Kitchen co-enzyme Q-10 30 MG capsule Take 30 mg by mouth daily.     . ferrous sulfate 325 (65 FE) MG tablet Take 325 mg by mouth daily with breakfast.    . fish oil-omega-3 fatty acids 1000 MG capsule Take 2 g by mouth daily.    . Multiple Vitamin (MULTIVITAMIN) tablet Take 1 tablet by mouth daily.    Marland Kitchen olmesartan-hydrochlorothiazide (BENICAR HCT) 20-12.5 MG per tablet Take 1 tablet by mouth daily.      . pantoprazole (PROTONIX) 40 MG tablet Take 40 mg by mouth daily. Heartburn    . PROAIR HFA 108 (90 BASE) MCG/ACT inhaler Take 2 puffs by mouth 4 (four) times daily.      No current facility-administered medications on file prior to visit.         Objective:   Physical Exam Blood pressure 130/80, pulse 60, temperature 97.8 F (36.6 C), height 5' 6.5" (1.689 m), weight 202 lb 12.8 oz (92 kg). Alert and oriented. Skin warm and dry. Oral mucosa is moist.   . Sclera anicteric, conjunctivae is pink. Thyroid not enlarged. No cervical lymphadenopathy. Lungs clear. Heart regular rate and rhythm.  Abdomen is soft. Bowel sounds are positive. No hepatomegaly.  No abdominal masses felt. No tenderness.  No edema to lower extremities.   Stool brown and guaiac positive. No masses felt.         Assessment & Plan:  Guaiac positive stool.   Colonic neoplasm,polyp, AVM needs to be ruled.

## 2017-07-18 DIAGNOSIS — M1711 Unilateral primary osteoarthritis, right knee: Secondary | ICD-10-CM | POA: Diagnosis not present

## 2017-07-18 DIAGNOSIS — M25561 Pain in right knee: Secondary | ICD-10-CM | POA: Diagnosis not present

## 2017-07-23 DIAGNOSIS — M25579 Pain in unspecified ankle and joints of unspecified foot: Secondary | ICD-10-CM | POA: Diagnosis not present

## 2017-07-23 DIAGNOSIS — M79672 Pain in left foot: Secondary | ICD-10-CM | POA: Diagnosis not present

## 2017-07-23 DIAGNOSIS — M79671 Pain in right foot: Secondary | ICD-10-CM | POA: Diagnosis not present

## 2017-07-24 ENCOUNTER — Ambulatory Visit (HOSPITAL_COMMUNITY)
Admission: RE | Admit: 2017-07-24 | Discharge: 2017-07-24 | Disposition: A | Discharge status: Home / Self Care | Payer: Medicare Other | Source: Ambulatory Visit | Attending: Internal Medicine | Admitting: Internal Medicine

## 2017-07-24 ENCOUNTER — Encounter (HOSPITAL_COMMUNITY)
Admission: RE | Disposition: A | Discharge status: Home / Self Care | Payer: Self-pay | Source: Ambulatory Visit | Attending: Internal Medicine

## 2017-07-24 ENCOUNTER — Encounter (HOSPITAL_COMMUNITY): Payer: Self-pay | Admitting: *Deleted

## 2017-07-24 DIAGNOSIS — M722 Plantar fascial fibromatosis: Secondary | ICD-10-CM | POA: Insufficient documentation

## 2017-07-24 DIAGNOSIS — I10 Essential (primary) hypertension: Secondary | ICD-10-CM | POA: Diagnosis not present

## 2017-07-24 DIAGNOSIS — K644 Residual hemorrhoidal skin tags: Secondary | ICD-10-CM | POA: Insufficient documentation

## 2017-07-24 DIAGNOSIS — Z885 Allergy status to narcotic agent status: Secondary | ICD-10-CM | POA: Insufficient documentation

## 2017-07-24 DIAGNOSIS — Z882 Allergy status to sulfonamides status: Secondary | ICD-10-CM | POA: Insufficient documentation

## 2017-07-24 DIAGNOSIS — Z791 Long term (current) use of non-steroidal anti-inflammatories (NSAID): Secondary | ICD-10-CM | POA: Diagnosis not present

## 2017-07-24 DIAGNOSIS — Z87891 Personal history of nicotine dependence: Secondary | ICD-10-CM | POA: Insufficient documentation

## 2017-07-24 DIAGNOSIS — K5730 Diverticulosis of large intestine without perforation or abscess without bleeding: Secondary | ICD-10-CM | POA: Insufficient documentation

## 2017-07-24 DIAGNOSIS — R195 Other fecal abnormalities: Secondary | ICD-10-CM | POA: Diagnosis not present

## 2017-07-24 DIAGNOSIS — Z90710 Acquired absence of both cervix and uterus: Secondary | ICD-10-CM | POA: Insufficient documentation

## 2017-07-24 DIAGNOSIS — K219 Gastro-esophageal reflux disease without esophagitis: Secondary | ICD-10-CM | POA: Diagnosis not present

## 2017-07-24 HISTORY — PX: COLONOSCOPY: SHX5424

## 2017-07-24 HISTORY — DX: Other specified postprocedural states: Z98.890

## 2017-07-24 SURGERY — COLONOSCOPY
Anesthesia: Moderate Sedation

## 2017-07-24 MED ORDER — MEPERIDINE HCL 50 MG/ML IJ SOLN
INTRAMUSCULAR | Status: AC
  Filled 2017-07-24: qty 1

## 2017-07-24 MED ORDER — MEPERIDINE HCL 50 MG/ML IJ SOLN
INTRAMUSCULAR | Status: DC | PRN
  Administered 2017-07-24 (×2): 25 mg via INTRAVENOUS

## 2017-07-24 MED ORDER — MIDAZOLAM HCL 5 MG/5ML IJ SOLN
INTRAMUSCULAR | Status: AC
  Filled 2017-07-24: qty 10

## 2017-07-24 MED ORDER — STERILE WATER FOR IRRIGATION IR SOLN
Status: DC | PRN
  Administered 2017-07-24: 13:00:00

## 2017-07-24 MED ORDER — MIDAZOLAM HCL 5 MG/5ML IJ SOLN
INTRAMUSCULAR | Status: DC | PRN
  Administered 2017-07-24: 2 mg via INTRAVENOUS
  Administered 2017-07-24: 1 mg via INTRAVENOUS
  Administered 2017-07-24 (×2): 2 mg via INTRAVENOUS

## 2017-07-24 MED ORDER — SODIUM CHLORIDE 0.9 % IV SOLN
INTRAVENOUS | Status: DC
  Administered 2017-07-24: 12:00:00 via INTRAVENOUS

## 2017-07-24 NOTE — H&P (Signed)
Candice [name] is an 66 y.o. female.   Chief Complaint: Patient is  here for colonoscopy. HPI: patient is 66 year old African-American who was found to have heme-positive stool and is here for diagnostic colonoscopy. She also reports intermittent hematochezia when she is constipated and has to strain. She denies  Pilar Plate rectal bleeding or melena. She has history of iron deficiency a with EGD and colonoscopy. It was felt she had impaired iron absorption due to chronic PPI therapy. She says heartburn controlled with therapy.she has good appetite and denies abdominal pain. Patient has history of plantar fascitis and has been on NSAIDs. She states when the stool testing was not taking any. Family history is negative for CRC.  Past Medical History:  Diagnosis Date  . GERD (gastroesophageal reflux disease)   . Herpes   . History of lumpectomy of right breast   . Hypertension     Past Surgical History:  Procedure Laterality Date  . ABDOMINAL HYSTERECTOMY    . BREAST BIOPSY  07/13/11   Right - Dr Margot Chimes  . BREAST SURGERY  01/17/2007   Right Dr Lennie Hummer  . COLONOSCOPY  10/30/2012   Procedure: COLONOSCOPY;  Surgeon: Rogene Houston, MD;  [facility]: [facility];  Service: Endoscopy;  Laterality: N/A;  200  . ESOPHAGOGASTRODUODENOSCOPY  10/30/2012   Procedure: ESOPHAGOGASTRODUODENOSCOPY (EGD);  Surgeon: Rogene Houston, MD;  Location: [facility];  Service: Endoscopy;  Laterality: N/A;  . [place]    History reviewed. No pertinent family history. Social History:  reports that she has quit smoking. She has never used smokeless tobacco. She reports that she does not drink alcohol or use drugs.  Allergies:  Allergies  Allergen Reactions  . Codeine Nausea Only  . Sulfur Rash    Medications Prior to Admission  Medication Sig Dispense Refill  . acyclovir (ZOVIRAX) 400 MG tablet Take 400 mg by mouth daily.     . Cholecalciferol (VITAMIN D) 2000 units CAPS Take 2,000 Units by  mouth daily.     . citalopram (CELEXA) 20 MG tablet Take 20 mg by mouth daily.    . diclofenac (VOLTAREN) 75 MG EC tablet Take 75 mg by mouth 2 (two) times daily.    . fish oil-omega-3 fatty acids 1000 MG capsule Take 1 g by mouth daily.     . Fluticasone-Salmeterol (ADVAIR) 250-50 MCG/DOSE AEPB Inhale 1 puff into the lungs daily as needed (for shortness of breath).     . linaclotide (LINZESS) 145 MCG CAPS capsule Take 145 mcg by mouth daily as needed (for constipation).     Marland Kitchen loratadine (CLARITIN) 10 MG tablet Take 10 mg by mouth daily.    Marland Kitchen olmesartan-hydrochlorothiazide (BENICAR HCT) 20-12.5 MG per tablet Take 1 tablet by mouth daily.      Marland Kitchen orlistat (ALLI) 60 MG capsule Take 60 mg by mouth 2 (two) times daily.    Marland Kitchen OVER THE COUNTER MEDICATION Take 1 capsule by mouth daily. OTC Abs Cut Belly Fat    . OVER THE COUNTER MEDICATION Take 1 capsule by mouth 2 (two) times daily. Lipozene    . pantoprazole (PROTONIX) 40 MG tablet Take 40 mg by mouth daily. Heartburn    . PROAIR HFA 108 (90 BASE) MCG/ACT inhaler Take 2 puffs by mouth 4 (four) times daily as needed for wheezing or shortness of breath.     . celecoxib (CELEBREX) 100 MG capsule Take 1 capsule (100 mg total) by mouth daily as needed for pain. (  Patient not taking: Reported on 07/20/2017) 30 capsule 3    No results found for this or any previous visit (from the past 48 hour(s)). No results found.  ROS  Blood pressure 123/75, pulse 70, temperature 98.2 F (36.8 C), temperature source Oral, resp. rate 16, SpO2 99 %. Physical Exam  Constitutional: She appears well-developed and well-nourished.  HENT:  Mouth/Throat: Oropharynx is clear and moist.  Eyes: Conjunctivae are normal. No scleral icterus.  Neck: No thyromegaly present.  Cardiovascular: Normal rate, regular rhythm and normal heart sounds.   No murmur heard. Respiratory: Effort normal and breath sounds normal.  GI:  Abdomen is full but soft and non-tender without organomegaly  or masses.  Lymphadenopathy:    She has no cervical adenopathy.     Assessment/Plan Heme positive stool. Diagnostic colonoscopy.  Hildred Laser, MD 07/24/2017, 12:32 PM

## 2017-07-24 NOTE — Discharge Instructions (Signed)
Resume usual medications as before. High fiber diet. No driving for 24 hours. CBC in 3 months. Office will call. Next colonoscopy in 10 years..   Colonoscopy, Adult, Care After This sheet gives you information about how to care for yourself after your procedure. Your doctor may also give you more specific instructions. If you have problems or questions, call your doctor. Follow these instructions at home: General instructions   For the first 24 hours after the procedure: ? Do not drive or use machinery. ? Do not sign important documents. ? Do not drink alcohol. ? Do your daily activities more slowly than normal. ? Eat foods that are soft and easy to digest. ? Rest often.  Take over-the-counter or prescription medicines only as told by your doctor.  It is up to you to get the results of your procedure. Ask your doctor, or the department performing the procedure, when your results will be ready. To help cramping and bloating:  Try walking around.  Put heat on your belly (abdomen) as told by your doctor. Use a heat source that your doctor recommends, such as a moist heat pack or a heating pad. ? Put a towel between your skin and the heat source. ? Leave the heat on for 20-30 minutes. ? Remove the heat if your skin turns bright red. This is especially important if you cannot feel pain, heat, or cold. You can get burned. Eating and drinking  Drink enough fluid to keep your pee (urine) clear or pale yellow.  Return to your normal diet as told by your doctor. Avoid heavy or fried foods that are hard to digest.  Avoid drinking alcohol for as long as told by your doctor. Contact a doctor if:  You have blood in your poop (stool) 2-3 days after the procedure. Get help right away if:  You have more than a small amount of blood in your poop.  You see large clumps of tissue (blood clots) in your poop.  Your belly is swollen.  You feel sick to your stomach (nauseous).  You throw  up (vomit).  You have a fever.  You have belly pain that gets worse, and medicine does not help your pain. This information is not intended to replace advice given to you by your health care provider. Make sure you discuss any questions you have with your health care provider. Document Released: 12/02/2010 Document Revised: 07/24/2016 Document Reviewed: 07/24/2016 Elsevier Interactive Patient Education  2017 [employer]  Diverticulosis Diverticulosis is a condition that develops when small pouches (diverticula) form in the wall of the large intestine (colon). The colon is where water is absorbed and stool is formed. The pouches form when the inside layer of the colon pushes through weak spots in the outer layers of the colon. You may have a few pouches or many of them. What are the causes? The cause of this condition is not known. What increases the risk? The following factors may make you more likely to develop this condition:  Being older than age 2. Your risk for this condition increases with age. Diverticulosis is rare among people younger than age 23. By age 43, many people have it.  Eating a low-fiber diet.  Having frequent constipation.  Being overweight.  Not getting enough exercise.  Smoking.  Taking over-the-counter pain medicines, like aspirin and ibuprofen.  Having a family history of diverticulosis.  What are the signs or symptoms? In most people, there are no symptoms of this condition. If you  do have symptoms, they may include:  Bloating.  Cramps in the abdomen.  Constipation or diarrhea.  Pain in the lower left side of the abdomen.  How is this diagnosed? This condition is most often diagnosed during an exam for other colon problems. Because diverticulosis usually has no symptoms, it often cannot be diagnosed independently. This condition may be diagnosed by:  Using a flexible scope to examine the colon (colonoscopy).  Taking an X-ray of the colon  after dye has been put into the colon (barium enema).  Doing a CT scan.  How is this treated? You may not need treatment for this condition if you have never developed an infection related to diverticulosis. If you have had an infection before, treatment may include:  Eating a high-fiber diet. This may include eating more fruits, vegetables, and grains.  Taking a fiber supplement.  Taking a live bacteria supplement (probiotic).  Taking medicine to relax your colon.  Taking antibiotic medicines.  Follow these instructions at home:  Drink 6-8 glasses of water or more each day to prevent constipation.  Try not to strain when you have a bowel movement.  If you have had an infection before: ? Eat more fiber as directed by your health care provider or your diet and nutrition specialist (dietitian). ? Take a fiber supplement or probiotic, if your health care provider approves.  Take over-the-counter and prescription medicines only as told by your health care provider.  If you were prescribed an antibiotic, take it as told by your health care provider. Do not stop taking the antibiotic even if you start to feel better.  Keep all follow-up visits as told by your health care provider. This is important. Contact a health care provider if:  You have pain in your abdomen.  You have bloating.  You have cramps.  You have not had a bowel movement in 3 days. Get help right away if:  Your pain gets worse.  Your bloating becomes very bad.  You have a fever or chills, and your symptoms suddenly get worse.  You vomit.  You have bowel movements that are bloody or black.  You have bleeding from your rectum. Summary  Diverticulosis is a condition that develops when small pouches (diverticula) form in the wall of the large intestine (colon).  You may have a few pouches or many of them.  This condition is most often diagnosed during an exam for other colon problems.  If you have  had an infection related to diverticulosis, treatment may include increasing the fiber in your diet, taking supplements, or taking medicines. This information is not intended to replace advice given to you by your health care provider. Make sure you discuss any questions you have with your health care provider. Document Released: 07/27/2004 Document Revised: 09/18/2016 Document Reviewed: 09/18/2016 Elsevier Interactive Patient Education  2017 [employer].  Hemorrhoids Hemorrhoids are swollen veins in and around the rectum or anus. There are two types of hemorrhoids:  Internal hemorrhoids. These occur in the veins that are just inside the rectum. They may poke through to the outside and become irritated and painful.  External hemorrhoids. These occur in the veins that are outside of the anus and can be felt as a painful swelling or hard lump near the anus.  Most hemorrhoids do not cause serious problems, and they can be managed with home treatments such as diet and lifestyle changes. If home treatments do not help your symptoms, procedures can be done to shrink  or remove the hemorrhoids. What are the causes? This condition is caused by increased pressure in the anal area. This pressure may result from various things, including:  Constipation.  Straining to have a bowel movement.  Diarrhea.  Pregnancy.  Obesity.  Sitting for long periods of time.  Heavy lifting or other activity that causes you to strain.  Anal sex.  What are the signs or symptoms? Symptoms of this condition include:  Pain.  Anal itching or irritation.  Rectal bleeding.  Leakage of stool (feces).  Anal swelling.  One or more lumps around the anus.  How is this diagnosed? This condition can often be diagnosed through a visual exam. Other exams or tests may also be done, such as:  Examination of the rectal area with a gloved hand (digital rectal exam).  Examination of the anal canal using a small  tube (anoscope).  A blood test, if you have lost a significant amount of blood.  A test to look inside the colon (sigmoidoscopy or colonoscopy).  How is this treated? This condition can usually be treated at home. However, various procedures may be done if dietary changes, lifestyle changes, and other home treatments do not help your symptoms. These procedures can help make the hemorrhoids smaller or remove them completely. Some of these procedures involve surgery, and others do not. Common procedures include:  Rubber band ligation. Rubber bands are placed at the base of the hemorrhoids to cut off the blood supply to them.  Sclerotherapy. Medicine is injected into the hemorrhoids to shrink them.  Infrared coagulation. A type of light energy is used to get rid of the hemorrhoids.  Hemorrhoidectomy surgery. The hemorrhoids are surgically removed, and the veins that supply them are tied off.  Stapled hemorrhoidopexy surgery. A circular stapling device is used to remove the hemorrhoids and use staples to cut off the blood supply to them.  Follow these instructions at home: Eating and drinking  Eat foods that have a lot of fiber in them, such as whole grains, beans, nuts, fruits, and vegetables. Ask your health care provider about taking products that have added fiber (fiber supplements).  Drink enough fluid to keep your urine clear or pale yellow. Managing pain and swelling  Take warm sitz baths for 20 minutes, 3-4 times a day to ease pain and discomfort.  If directed, apply ice to the affected area. Using ice packs between sitz baths may be helpful. ? Put ice in a plastic bag. ? Place a towel between your skin and the bag. ? Leave the ice on for 20 minutes, 2-3 times a day. General instructions  Take over-the-counter and prescription medicines only as told by your health care provider.  Use medicated creams or suppositories as told.  Exercise regularly.  Go to the bathroom when  you have the urge to have a bowel movement. Do not wait.  Avoid straining to have bowel movements.  Keep the anal area dry and clean. Use wet toilet paper or moist towelettes after a bowel movement.  Do not sit on the toilet for long periods of time. This increases blood pooling and pain. Contact a health care provider if:  You have increasing pain and swelling that are not controlled by treatment or medicine.  You have uncontrolled bleeding.  You have difficulty having a bowel movement, or you are unable to have a bowel movement.  You have pain or inflammation outside the area of the hemorrhoids. This information is not intended to replace advice  given to you by your health care provider. Make sure you discuss any questions you have with your health care provider. Document Released: 10/27/2000 Document Revised: 03/29/2016 Document Reviewed: 07/14/2015 Elsevier Interactive Patient Education  2017 [employer].

## 2017-07-24 NOTE — Op Note (Signed)
[facility] Patient Name: Candice [name] Procedure Date: 07/24/2017 8:34 AM MRN: [id number] Date of Birth: 1951/08/18 Attending MD: Hildred Laser , MD CSN: [id number] Age: 66 Admit Type: Outpatient Procedure:                Colonoscopy Indications:              Heme positive stool Providers:                Hildred Laser, MD, Otis Peak B. Sharon Seller, RN, Aram Candela Referring MD:             Manon Hilding, MD Medicines:                Meperidine 50 mg IV, Midazolam 7 mg IV Complications:            No immediate complications. Estimated Blood Loss:     Estimated blood loss: none. Procedure:                Pre-Anesthesia Assessment:                           - Prior to the procedure, a History and Physical                            was performed, and patient medications and                            allergies were reviewed. The patient's tolerance of                            previous anesthesia was also reviewed. The risks                            and benefits of the procedure and the sedation                            options and risks were discussed with the patient.                            All questions were answered, and informed consent                            was obtained. Prior Anticoagulants: The patient                            last took previous NSAID medication on the day of                            the procedure. ASA Grade Assessment: II - A patient                            with mild systemic disease. After reviewing the  risks and benefits, the patient was deemed in                            satisfactory condition to undergo the procedure.                           After obtaining informed consent, the colonoscope                            was passed under direct vision. Throughout the                            procedure, the patient's blood pressure, pulse, and                            oxygen  saturations were monitored continuously. The                            EC-[id number] ([id number]) scope was introduced through                            the anus and advanced to the the cecum, identified                            by appendiceal orifice and ileocecal valve. The                            colonoscopy was performed without difficulty. The                            patient tolerated the procedure well. The quality                            of the bowel preparation was good. The ileocecal                            valve, appendiceal orifice, and rectum were                            photographed. Scope In: 12:47:59 PM Scope Out: 1:04:41 PM Scope Withdrawal Time: 0 hours 8 minutes 57 seconds  Total Procedure Duration: 0 hours 16 minutes 42 seconds  Findings:      The perianal and digital rectal examinations were normal.      Scattered medium-mouthed diverticula were found in the sigmoid colon.      The exam was otherwise normal throughout the examined colon.      External hemorrhoids were found during retroflexion. The hemorrhoids       were small. Impression:               - Diverticulosis in the sigmoid colon.                           - External hemorrhoids.                           -  No specimens collected. Moderate Sedation:      Moderate (conscious) sedation was administered by the endoscopy nurse       and supervised by the endoscopist. The following parameters were       monitored: oxygen saturation, heart rate, blood pressure, CO2       capnography and response to care. Total physician intraservice time was       26 minutes. Recommendation:           - Patient has a contact number available for                            emergencies. The signs and symptoms of potential                            delayed complications were discussed with the                            patient. Return to normal activities tomorrow.                            Written discharge  instructions were provided to the                            patient.                           - High fiber diet today.                           - Continue present medications.                           - Repeat colonoscopy in 10 years for screening                            purposes. Procedure Code(s):        --- Professional ---                           [id number], Colonoscopy, flexible; diagnostic, including                            collection of specimen(s) by brushing or washing,                            when performed (separate procedure)                           99152, Moderate sedation services provided by the                            same physician or other qualified health care                            professional performing the diagnostic or  therapeutic service that the sedation supports,                            requiring the presence of an independent trained                            observer to assist in the monitoring of the                            patient's level of consciousness and physiological                            status; initial 15 minutes of intraservice time,                            patient age 48 years or older                           [phone], Moderate sedation services; each additional                            15 minutes intraservice time Diagnosis Code(s):        --- Professional ---                           K64.4, Residual hemorrhoidal skin tags                           R19.5, Other fecal abnormalities                           K57.30, Diverticulosis of large intestine without                            perforation or abscess without bleeding CPT copyright 2016 American Medical Association. All rights reserved. The codes documented in this report are preliminary and upon coder review may  be revised to meet current compliance requirements. Hildred Laser, MD Hildred Laser, MD 07/24/2017 1:15:11 PM This  report has been signed electronically. Number of Addenda: 0

## 2017-07-26 ENCOUNTER — Encounter (HOSPITAL_COMMUNITY): Payer: Self-pay | Admitting: Internal Medicine

## 2017-07-26 DIAGNOSIS — Z23 Encounter for immunization: Secondary | ICD-10-CM | POA: Diagnosis not present

## 2017-08-07 DIAGNOSIS — N816 Rectocele: Secondary | ICD-10-CM | POA: Diagnosis not present

## 2017-08-07 DIAGNOSIS — Z0189 Encounter for other specified special examinations: Secondary | ICD-10-CM | POA: Diagnosis not present

## 2017-08-09 ENCOUNTER — Encounter (HOSPITAL_BASED_OUTPATIENT_CLINIC_OR_DEPARTMENT_OTHER): Payer: Self-pay | Admitting: *Deleted

## 2017-08-10 ENCOUNTER — Encounter (HOSPITAL_BASED_OUTPATIENT_CLINIC_OR_DEPARTMENT_OTHER): Payer: Self-pay | Admitting: *Deleted

## 2017-08-10 NOTE — Progress Notes (Signed)
Needs cbc , bmet and ekg

## 2017-08-13 NOTE — H&P (Signed)
Candice [name] is an 66 y.o. female, seen in August as a new patient.  She is s/p TAH,  has pelvic prolapse, has tried pessaries but couldn't get one to stay in. She has a significant bulge and occasional pressure. Likes to exercise, difficult sometimes with bulge. She does have to push stool out with fingers in vagina sometimes. Rare urine leak.   Pertinent Gynecological History:  Last mammogram: normal Date: 11/2014 OB History: G1, P1001   Menstrual History: No LMP recorded. Patient has had a hysterectomy.    Past Medical History:  Diagnosis Date  . Anxiety   . Asthma   . Chronic constipation   . Diverticulosis of colon   . Dyspnea    sob with exertion  . GERD (gastroesophageal reflux disease)   . Hemorrhoids   . Herpes   . History of lumpectomy of right breast   . Hypertension   . Major depression   . Rectocele    W/ POSSIBLE CYSTOCELE    Past Surgical History:  Procedure Laterality Date  . BREAST SURGERY Right 01/17/2007   dr tim davis   excisional breast mass (papilloma)  . COLONOSCOPY  10/30/2012   Procedure: COLONOSCOPY;  Surgeon: Rogene Houston, MD;  Location: [facility];  Service: Endoscopy;  Laterality: N/A;  200  . COLONOSCOPY N/A 07/24/2017   Procedure: COLONOSCOPY;  Surgeon: Rogene Houston, MD;  Location: [facility];  Service: Endoscopy;  Laterality: N/A;  1230  . ESOPHAGOGASTRODUODENOSCOPY  10/30/2012   Procedure: ESOPHAGOGASTRODUODENOSCOPY (EGD);  Surgeon: Rogene Houston, MD;  [facility]: [facility];  Service: Endoscopy;  Laterality: N/A;  . [place]  . NEEDLE-GUIDED EXCISION RIGHT BREAST MASS  07-13-2011  dr Margot Chimes  . VAGINAL HYSTERECTOMY     complete    No family history on file.  Social History:  reports that she has quit smoking. She has never used smokeless tobacco. She reports that she drinks alcohol. She reports that she does not use drugs.  Allergies:  Allergies  Allergen Reactions  . Codeine Nausea Only  . Sulfur  Rash    No prescriptions prior to admission.    Review of Systems  Respiratory: Negative.   Cardiovascular: Negative.     Height 5' 6.5" (1.689 m), weight 202 lb (91.6 kg). Physical Exam  Constitutional: She appears well-developed and well-nourished.  Cardiovascular: Normal rate, regular rhythm and normal heart sounds.   No murmur heard. Respiratory: Effort normal and breath sounds normal. No respiratory distress. She has no wheezes.  GI: Soft. She exhibits no distension and no mass. There is no tenderness.  Genitourinary:  Genitourinary Comments: Minimal cystocele Gr III rectocele    No results found for this or any previous visit (from the past 24 hour(s)).  No results found.  Assessment/Plan: Symptomatic rectocele and small cystocele. She has tried pessaries and couldn't find one to stay in, so she wants to proceed with surgical therapy.  Surgical procedure and risks discussed, as well as chances of relieving her symptoms.  Will admit for posterior repair, possible anterior repair.  Blane Ohara Haylynn Pha 08/13/2017, 9:18 PM

## 2017-08-14 ENCOUNTER — Ambulatory Visit (HOSPITAL_BASED_OUTPATIENT_CLINIC_OR_DEPARTMENT_OTHER)
Admission: RE | Admit: 2017-08-14 | Discharge: 2017-08-15 | Disposition: A | Discharge status: Home / Self Care | Payer: Medicare Other | Source: Ambulatory Visit | Attending: Obstetrics and Gynecology | Admitting: Obstetrics and Gynecology

## 2017-08-14 ENCOUNTER — Ambulatory Visit (HOSPITAL_BASED_OUTPATIENT_CLINIC_OR_DEPARTMENT_OTHER): Payer: Medicare Other | Admitting: Anesthesiology

## 2017-08-14 ENCOUNTER — Encounter (HOSPITAL_BASED_OUTPATIENT_CLINIC_OR_DEPARTMENT_OTHER): Payer: Self-pay | Admitting: Certified Registered"

## 2017-08-14 ENCOUNTER — Encounter (HOSPITAL_BASED_OUTPATIENT_CLINIC_OR_DEPARTMENT_OTHER)
Admission: RE | Disposition: A | Discharge status: Home / Self Care | Payer: Self-pay | Source: Ambulatory Visit | Attending: Obstetrics and Gynecology

## 2017-08-14 DIAGNOSIS — I10 Essential (primary) hypertension: Secondary | ICD-10-CM | POA: Insufficient documentation

## 2017-08-14 DIAGNOSIS — Z87891 Personal history of nicotine dependence: Secondary | ICD-10-CM | POA: Diagnosis not present

## 2017-08-14 DIAGNOSIS — F419 Anxiety disorder, unspecified: Secondary | ICD-10-CM | POA: Insufficient documentation

## 2017-08-14 DIAGNOSIS — J45909 Unspecified asthma, uncomplicated: Secondary | ICD-10-CM | POA: Insufficient documentation

## 2017-08-14 DIAGNOSIS — F329 Major depressive disorder, single episode, unspecified: Secondary | ICD-10-CM | POA: Diagnosis not present

## 2017-08-14 DIAGNOSIS — N815 Vaginal enterocele: Secondary | ICD-10-CM | POA: Diagnosis present

## 2017-08-14 DIAGNOSIS — K5909 Other constipation: Secondary | ICD-10-CM | POA: Insufficient documentation

## 2017-08-14 DIAGNOSIS — N816 Rectocele: Secondary | ICD-10-CM | POA: Diagnosis not present

## 2017-08-14 DIAGNOSIS — N8110 Cystocele, unspecified: Secondary | ICD-10-CM | POA: Diagnosis not present

## 2017-08-14 DIAGNOSIS — K219 Gastro-esophageal reflux disease without esophagitis: Secondary | ICD-10-CM | POA: Diagnosis not present

## 2017-08-14 DIAGNOSIS — D509 Iron deficiency anemia, unspecified: Secondary | ICD-10-CM | POA: Diagnosis not present

## 2017-08-14 HISTORY — DX: Dyspnea, unspecified: R06.00

## 2017-08-14 HISTORY — DX: Unspecified hemorrhoids: K64.9

## 2017-08-14 HISTORY — DX: Unspecified asthma, uncomplicated: J45.909

## 2017-08-14 HISTORY — DX: Other constipation: K59.09

## 2017-08-14 HISTORY — DX: Diverticulosis of large intestine without perforation or abscess without bleeding: K57.30

## 2017-08-14 HISTORY — DX: Anxiety disorder, unspecified: F41.9

## 2017-08-14 HISTORY — DX: Rectocele: N81.6

## 2017-08-14 HISTORY — DX: Major depressive disorder, single episode, unspecified: F32.9

## 2017-08-14 HISTORY — PX: RECTOCELE REPAIR: SHX761

## 2017-08-14 LAB — BASIC METABOLIC PANEL
Anion gap: 8 (ref 5–15)
BUN: 19 mg/dL (ref 6–20)
CO2: 27 mmol/L (ref 22–32)
Calcium: 9.5 mg/dL (ref 8.9–10.3)
Chloride: 104 mmol/L (ref 101–111)
Creatinine, Ser: 1 mg/dL (ref 0.44–1.00)
GFR calc Af Amer: >60 mL/min (ref >60)
GFR calc non Af Amer: 57 mL/min — ABNORMAL LOW (ref >60)
Glucose, Bld: 97 mg/dL (ref 65–99)
Potassium: 3.9 mmol/L (ref 3.5–5.1)
Sodium: 139 mmol/L (ref 135–145)

## 2017-08-14 LAB — CBC
HCT: 31.7 % — ABNORMAL LOW (ref 36.0–46.0)
Hemoglobin: 10.3 g/dL — ABNORMAL LOW (ref 12.0–15.0)
MCH: 29.7 pg (ref 26.0–34.0)
MCHC: 32.5 g/dL (ref 30.0–36.0)
MCV: 91.4 fL (ref 78.0–100.0)
Platelets: 280 10*3/uL (ref 150–400)
RBC: 3.47 MIL/uL — ABNORMAL LOW (ref 3.87–5.11)
RDW: 14.7 % (ref 11.5–15.5)
WBC: 5.3 10*3/uL (ref 4.0–10.5)

## 2017-08-14 SURGERY — COLPORRHAPHY, POSTERIOR, FOR RECTOCELE REPAIR
Anesthesia: General | Site: Vagina

## 2017-08-14 MED ORDER — ROCURONIUM BROMIDE 50 MG/5ML IV SOSY
PREFILLED_SYRINGE | INTRAVENOUS | Status: AC
  Filled 2017-08-14: qty 5

## 2017-08-14 MED ORDER — SIMETHICONE 80 MG PO CHEW
80.0000 mg | CHEWABLE_TABLET | Freq: Four times a day (QID) | ORAL | Status: DC | PRN
  Filled 2017-08-14: qty 1

## 2017-08-14 MED ORDER — KETOROLAC TROMETHAMINE 30 MG/ML IJ SOLN
INTRAMUSCULAR | Status: AC
  Filled 2017-08-14: qty 1

## 2017-08-14 MED ORDER — OXYCODONE-ACETAMINOPHEN 5-325 MG PO TABS
ORAL_TABLET | ORAL | Status: AC
  Filled 2017-08-14: qty 1

## 2017-08-14 MED ORDER — CLONAZEPAM 0.5 MG PO TABS
0.2500 mg | ORAL_TABLET | Freq: Every day | ORAL | Status: DC
  Administered 2017-08-15: 0.25 mg via ORAL
  Filled 2017-08-14 (×2): qty 1

## 2017-08-14 MED ORDER — HYDROCHLOROTHIAZIDE 12.5 MG PO CAPS
12.5000 mg | ORAL_CAPSULE | Freq: Every day | ORAL | Status: DC
  Filled 2017-08-14: qty 1

## 2017-08-14 MED ORDER — SUGAMMADEX SODIUM 200 MG/2ML IV SOLN
INTRAVENOUS | Status: DC | PRN
  Administered 2017-08-14: 200 mg via INTRAVENOUS

## 2017-08-14 MED ORDER — LACTATED RINGERS IV SOLN
INTRAVENOUS | Status: DC
  Administered 2017-08-14: 08:00:00 via INTRAVENOUS
  Filled 2017-08-14: qty 1000

## 2017-08-14 MED ORDER — OXYCODONE-ACETAMINOPHEN 5-325 MG PO TABS
1.0000 | ORAL_TABLET | ORAL | Status: DC | PRN
  Administered 2017-08-14 – 2017-08-15 (×3): 1 via ORAL
  Filled 2017-08-14: qty 2

## 2017-08-14 MED ORDER — KETOROLAC TROMETHAMINE 30 MG/ML IJ SOLN
30.0000 mg | Freq: Once | INTRAMUSCULAR | Status: DC
  Filled 2017-08-14: qty 1

## 2017-08-14 MED ORDER — KETOROLAC TROMETHAMINE 30 MG/ML IJ SOLN
30.0000 mg | Freq: Four times a day (QID) | INTRAMUSCULAR | Status: DC
  Administered 2017-08-14 – 2017-08-15 (×3): 30 mg via INTRAVENOUS
  Filled 2017-08-14: qty 1

## 2017-08-14 MED ORDER — DEXTROSE-NACL 5-0.45 % IV SOLN
INTRAVENOUS | Status: DC
  Filled 2017-08-14: qty 1000

## 2017-08-14 MED ORDER — EPHEDRINE SULFATE-NACL 50-0.9 MG/10ML-% IV SOSY
PREFILLED_SYRINGE | INTRAVENOUS | Status: DC | PRN
  Administered 2017-08-14 (×3): 10 mg via INTRAVENOUS

## 2017-08-14 MED ORDER — ALBUTEROL SULFATE HFA 108 (90 BASE) MCG/ACT IN AERS
2.0000 | INHALATION_SPRAY | Freq: Four times a day (QID) | RESPIRATORY_TRACT | Status: DC | PRN
  Filled 2017-08-14: qty 6.7

## 2017-08-14 MED ORDER — ONDANSETRON HCL 4 MG/2ML IJ SOLN
INTRAMUSCULAR | Status: DC | PRN
Start: 2017-08-14 — End: 2017-08-14
  Administered 2017-08-14: 4 mg via INTRAVENOUS

## 2017-08-14 MED ORDER — LIDOCAINE 2% (20 MG/ML) 5 ML SYRINGE
INTRAMUSCULAR | Status: AC
  Filled 2017-08-14: qty 5

## 2017-08-14 MED ORDER — ONDANSETRON HCL 4 MG/2ML IJ SOLN
INTRAMUSCULAR | Status: AC
  Filled 2017-08-14: qty 2

## 2017-08-14 MED ORDER — ACETAMINOPHEN 500 MG PO TABS
1000.0000 mg | ORAL_TABLET | Freq: Once | ORAL | Status: AC
  Administered 2017-08-14: 1000 mg via ORAL
  Filled 2017-08-14: qty 2

## 2017-08-14 MED ORDER — ONDANSETRON HCL 4 MG PO TABS
4.0000 mg | ORAL_TABLET | Freq: Four times a day (QID) | ORAL | Status: DC | PRN
  Filled 2017-08-14: qty 1

## 2017-08-14 MED ORDER — PANTOPRAZOLE SODIUM 40 MG PO TBEC
40.0000 mg | DELAYED_RELEASE_TABLET | Freq: Every day | ORAL | Status: DC
  Filled 2017-08-14: qty 1

## 2017-08-14 MED ORDER — SUGAMMADEX SODIUM 200 MG/2ML IV SOLN
INTRAVENOUS | Status: AC
  Filled 2017-08-14: qty 2

## 2017-08-14 MED ORDER — ACETAMINOPHEN 500 MG PO TABS
ORAL_TABLET | ORAL | Status: AC
  Filled 2017-08-14: qty 2

## 2017-08-14 MED ORDER — ROCURONIUM BROMIDE 10 MG/ML (PF) SYRINGE
PREFILLED_SYRINGE | INTRAVENOUS | Status: DC | PRN
  Administered 2017-08-14: 40 mg via INTRAVENOUS
  Administered 2017-08-14: 10 mg via INTRAVENOUS

## 2017-08-14 MED ORDER — MIDAZOLAM HCL 2 MG/2ML IJ SOLN
INTRAMUSCULAR | Status: AC
  Filled 2017-08-14: qty 2

## 2017-08-14 MED ORDER — MOMETASONE FURO-FORMOTEROL FUM 200-5 MCG/ACT IN AERO
2.0000 | INHALATION_SPRAY | Freq: Two times a day (BID) | RESPIRATORY_TRACT | Status: DC
  Filled 2017-08-14: qty 8.8

## 2017-08-14 MED ORDER — DEXAMETHASONE SODIUM PHOSPHATE 10 MG/ML IJ SOLN
INTRAMUSCULAR | Status: DC | PRN
  Administered 2017-08-14: 10 mg via INTRAVENOUS

## 2017-08-14 MED ORDER — MENTHOL 3 MG MT LOZG
LOZENGE | OROMUCOSAL | Status: AC
  Filled 2017-08-14: qty 9

## 2017-08-14 MED ORDER — ONDANSETRON HCL 4 MG/2ML IJ SOLN
4.0000 mg | Freq: Once | INTRAMUSCULAR | Status: DC | PRN
  Filled 2017-08-14: qty 2

## 2017-08-14 MED ORDER — SODIUM CHLORIDE 0.9 % IR SOLN
Status: DC | PRN
  Administered 2017-08-14: 500 mL

## 2017-08-14 MED ORDER — CITALOPRAM HYDROBROMIDE 20 MG PO TABS
20.0000 mg | ORAL_TABLET | ORAL | Status: DC
  Filled 2017-08-14: qty 1

## 2017-08-14 MED ORDER — LIDOCAINE 2% (20 MG/ML) 5 ML SYRINGE
INTRAMUSCULAR | Status: DC | PRN
  Administered 2017-08-14: 100 mg via INTRAVENOUS

## 2017-08-14 MED ORDER — MIDAZOLAM HCL 2 MG/2ML IJ SOLN
INTRAMUSCULAR | Status: DC | PRN
  Administered 2017-08-14: 2 mg via INTRAVENOUS

## 2017-08-14 MED ORDER — IBUPROFEN 600 MG PO TABS
600.0000 mg | ORAL_TABLET | Freq: Four times a day (QID) | ORAL | Status: DC | PRN
  Filled 2017-08-14: qty 1

## 2017-08-14 MED ORDER — MENTHOL 3 MG MT LOZG
1.0000 | LOZENGE | OROMUCOSAL | Status: DC | PRN
  Administered 2017-08-14 – 2017-08-15 (×2): 3 mg via ORAL
  Filled 2017-08-14: qty 9

## 2017-08-14 MED ORDER — DOCUSATE SODIUM 100 MG PO CAPS
100.0000 mg | ORAL_CAPSULE | Freq: Every day | ORAL | Status: DC
  Filled 2017-08-14: qty 1

## 2017-08-14 MED ORDER — LACTATED RINGERS IV SOLN
INTRAVENOUS | Status: DC
  Filled 2017-08-14: qty 1000

## 2017-08-14 MED ORDER — KETOROLAC TROMETHAMINE 30 MG/ML IJ SOLN
INTRAMUSCULAR | Status: DC | PRN
  Administered 2017-08-14: 30 mg via INTRAVENOUS

## 2017-08-14 MED ORDER — DEXAMETHASONE SODIUM PHOSPHATE 10 MG/ML IJ SOLN
INTRAMUSCULAR | Status: AC
  Filled 2017-08-14: qty 1

## 2017-08-14 MED ORDER — FENTANYL CITRATE (PF) 100 MCG/2ML IJ SOLN
25.0000 ug | INTRAMUSCULAR | Status: DC | PRN
  Filled 2017-08-14: qty 1

## 2017-08-14 MED ORDER — ONDANSETRON HCL 4 MG/2ML IJ SOLN
4.0000 mg | Freq: Four times a day (QID) | INTRAMUSCULAR | Status: DC | PRN
  Filled 2017-08-14: qty 2

## 2017-08-14 MED ORDER — CEFAZOLIN SODIUM-DEXTROSE 2-4 GM/100ML-% IV SOLN
INTRAVENOUS | Status: AC
  Filled 2017-08-14: qty 100

## 2017-08-14 MED ORDER — CEFAZOLIN SODIUM-DEXTROSE 2-4 GM/100ML-% IV SOLN
2.0000 g | INTRAVENOUS | Status: AC
  Administered 2017-08-14: 2 g via INTRAVENOUS
  Filled 2017-08-14: qty 100

## 2017-08-14 MED ORDER — FENTANYL CITRATE (PF) 100 MCG/2ML IJ SOLN
INTRAMUSCULAR | Status: AC
Start: 2017-08-14 — End: 2017-08-14
  Filled 2017-08-14: qty 2

## 2017-08-14 MED ORDER — IRBESARTAN 150 MG PO TABS
150.0000 mg | ORAL_TABLET | Freq: Every day | ORAL | Status: DC
  Filled 2017-08-14: qty 1

## 2017-08-14 MED ORDER — HYDROMORPHONE HCL 1 MG/ML IJ SOLN
1.0000 mg | INTRAMUSCULAR | Status: DC | PRN
  Filled 2017-08-14: qty 1

## 2017-08-14 MED ORDER — PROPOFOL 10 MG/ML IV BOLUS
INTRAVENOUS | Status: DC | PRN
  Administered 2017-08-14: 150 mg via INTRAVENOUS

## 2017-08-14 MED ORDER — LORATADINE 10 MG PO TABS
10.0000 mg | ORAL_TABLET | Freq: Every day | ORAL | Status: DC
  Filled 2017-08-14: qty 1

## 2017-08-14 MED ORDER — OLMESARTAN MEDOXOMIL-HCTZ 20-12.5 MG PO TABS: 1.0000 | ORAL_TABLET | Freq: Every day | ORAL | Status: DC

## 2017-08-14 MED ORDER — FENTANYL CITRATE (PF) 100 MCG/2ML IJ SOLN
INTRAMUSCULAR | Status: DC | PRN
  Administered 2017-08-14: 25 ug via INTRAVENOUS
  Administered 2017-08-14: 50 ug via INTRAVENOUS

## 2017-08-14 MED ORDER — KETOROLAC TROMETHAMINE 30 MG/ML IJ SOLN
30.0000 mg | Freq: Four times a day (QID) | INTRAMUSCULAR | Status: DC
  Filled 2017-08-14: qty 1

## 2017-08-14 MED ORDER — ALUM & MAG HYDROXIDE-SIMETH 200-200-20 MG/5ML PO SUSP
30.0000 mL | ORAL | Status: DC | PRN
  Filled 2017-08-14: qty 30

## 2017-08-14 SURGICAL SUPPLY — 32 items
BLADE SURG 15 STRL LF DISP TIS (BLADE) ×2 IMPLANT
BLADE SURG 15 STRL SS (BLADE) ×2
CATH ROBINSON RED A/P 16FR (CATHETERS) ×4 IMPLANT
CLOTH BEACON ORANGE TIMEOUT ST (SAFETY) ×4 IMPLANT
GAUZE PACKING 2X5 YD STRL (GAUZE/BANDAGES/DRESSINGS) ×4 IMPLANT
GLOVE BIO SURGEON STRL SZ8 (GLOVE) ×8 IMPLANT
GLOVE BIOGEL PI IND STRL 6.5 (GLOVE) ×6 IMPLANT
GLOVE BIOGEL PI IND STRL 7.0 (GLOVE) ×2 IMPLANT
GLOVE BIOGEL PI IND STRL 7.5 (GLOVE) ×2 IMPLANT
GLOVE BIOGEL PI INDICATOR 6.5 (GLOVE) ×6
GLOVE BIOGEL PI INDICATOR 7.0 (GLOVE) ×2
GLOVE BIOGEL PI INDICATOR 7.5 (GLOVE) ×2
GLOVE ORTHO TXT STRL SZ7.5 (GLOVE) ×4 IMPLANT
GOWN STRL REUS W/TWL LRG LVL3 (GOWN DISPOSABLE) ×12 IMPLANT
GOWN STRL REUS W/TWL XL LVL3 (GOWN DISPOSABLE) ×4 IMPLANT
NEEDLE HYPO 22GX1.5 SAFETY (NEEDLE) IMPLANT
NEEDLE MAYO CATGUT SZ4 (NEEDLE) IMPLANT
NS IRRIG 1000ML POUR BTL (IV SOLUTION) IMPLANT
NS IRRIG 500ML POUR BTL (IV SOLUTION) ×4 IMPLANT
PACK VAGINAL WOMENS (CUSTOM PROCEDURE TRAY) ×4 IMPLANT
PACKING VAGINAL (PACKING) ×4 IMPLANT
SUT PROLENE 1 CTX 30  8455H (SUTURE)
SUT PROLENE 1 CTX 30 8455H (SUTURE) IMPLANT
SUT SILK 0 SH 30 (SUTURE) ×4 IMPLANT
SUT VIC AB 2-0 CT2 27 (SUTURE) ×12 IMPLANT
SUT VIC AB 3-0 CT1 27 (SUTURE) ×2
SUT VIC AB 3-0 CT1 TAPERPNT 27 (SUTURE) ×2 IMPLANT
SUT VIC AB 3-0 SH 27 (SUTURE) ×2
SUT VIC AB 3-0 SH 27X BRD (SUTURE) ×2 IMPLANT
SUT VICRYL 0 UR6 27IN ABS (SUTURE) ×4 IMPLANT
TOWEL OR 17X24 6PK STRL BLUE (TOWEL DISPOSABLE) ×8 IMPLANT
TRAY FOLEY CATH SILVER 14FR (SET/KITS/TRAYS/PACK) ×4 IMPLANT

## 2017-08-14 NOTE — Interval H&P Note (Signed)
History and Physical Interval Note:  08/14/2017 9:14 AM  Candice [name] Rockne Coons  has presented today for surgery, with the diagnosis of proctocele without uterine prolapse  The various methods of treatment have been discussed with the patient and family. After consideration of risks, benefits and other options for treatment, the patient has consented to  Procedure(s) with comments: POSTERIOR REPAIR (RECTOCELE) (N/A) - Outpatient extended recovery ANTERIOR REPAIR (CYSTOCELE) (N/A) as a surgical intervention .  The patient's history has been reviewed, patient examined, no change in status, stable for surgery.  I have reviewed the patient's chart and labs.  Questions were answered to the patient's satisfaction.     Blane Ohara [name]

## 2017-08-14 NOTE — Anesthesia Postprocedure Evaluation (Signed)
Anesthesia Post Note  Patient: CLOEY SFERRAZZA  Procedure(s) Performed: POSTERIOR REPAIR (RECTOCELE) WITH ENTEROCELE REPAIR (N/A Vagina )     Patient location during evaluation: PACU Anesthesia Type: General Level of consciousness: awake and alert Pain management: pain level controlled Vital Signs Assessment: post-procedure vital signs reviewed and stable Respiratory status: spontaneous breathing, nonlabored ventilation, respiratory function stable and patient connected to nasal cannula oxygen Cardiovascular status: blood pressure returned to baseline and stable Postop Assessment: no apparent nausea or vomiting Anesthetic complications: no    Last Vitals:  Vitals:   08/14/17 1245 08/14/17 1300  BP: 120/68 122/61  Pulse: 89 82  Resp: 14 16  Temp:  36.6 C  SpO2: 95% 95%    Last Pain:  Vitals:   08/14/17 1245  TempSrc:   PainSc: 3                  Tiajuana Amass

## 2017-08-14 NOTE — Anesthesia Preprocedure Evaluation (Signed)
Anesthesia Evaluation  Patient identified by MRN, date of birth, ID band Patient awake    Reviewed: Allergy & Precautions, NPO status , Patient's Chart, lab work & pertinent test results  Airway Mallampati: II  TM Distance: >3 FB Neck ROM: Full    Dental  (+) Teeth Intact, Dental Advisory Given   Pulmonary asthma , former smoker,    Pulmonary exam normal breath sounds clear to auscultation       Cardiovascular hypertension, Pt. on medications (-) angina(-) CAD and (-) Past MI Normal cardiovascular exam Rhythm:Regular Rate:Normal     Neuro/Psych PSYCHIATRIC DISORDERS Anxiety Depression negative neurological ROS     GI/Hepatic Neg liver ROS, GERD  Medicated,  Endo/Other  Obesity   Renal/GU negative Renal ROS     Musculoskeletal negative musculoskeletal ROS (+)   Abdominal   Peds  Hematology negative hematology ROS (+)   Anesthesia Other Findings Day of surgery medications reviewed with the patient.  Reproductive/Obstetrics                             Anesthesia Physical Anesthesia Plan  ASA: II  Anesthesia Plan: General   Post-op Pain Management:    Induction: Intravenous  PONV Risk Score and Plan: 4 or greater and Ondansetron, Dexamethasone, Midazolam and Treatment may vary due to age or medical condition  Airway Management Planned: Oral ETT  Additional Equipment:   Intra-op Plan:   Post-operative Plan: Extubation in OR  Informed Consent: I have reviewed the patients History and Physical, chart, labs and discussed the procedure including the risks, benefits and alternatives for the proposed anesthesia with the patient or authorized representative who has indicated his/her understanding and acceptance.   Dental advisory given  Plan Discussed with: CRNA  Anesthesia Plan Comments: (Risks/benefits of general anesthesia discussed with patient including risk of damage to teeth,  lips, gum, and tongue, nausea/vomiting, allergic reactions to medications, and the possibility of heart attack, stroke and death.  All patient questions answered.  Patient wishes to proceed.)        Anesthesia Quick Evaluation

## 2017-08-14 NOTE — Anesthesia Procedure Notes (Signed)
Procedure Name: Intubation Date/Time: 08/14/2017 8:34 AM Performed by: Denna Haggard D Pre-anesthesia Checklist: Patient identified, Emergency Drugs available, Suction available and Patient being monitored Patient Re-evaluated:Patient Re-evaluated prior to induction Oxygen Delivery Method: Circle system utilized Preoxygenation: Pre-oxygenation with 100% oxygen Induction Type: IV induction Ventilation: Mask ventilation without difficulty Laryngoscope Size: Mac and 3 Grade View: Grade I Tube type: Oral Tube size: 7.0 mm Number of attempts: 1 Airway Equipment and Method: Stylet and Oral airway Placement Confirmation: ETT inserted through vocal cords under direct vision,  positive ETCO2 and breath sounds checked- equal and bilateral Secured at: 23 cm Tube secured with: Tape Dental Injury: Teeth and Oropharynx as per pre-operative assessment

## 2017-08-14 NOTE — Transfer of Care (Signed)
Immediate Anesthesia Transfer of Care Note  Patient: Candice [name]  Procedure(s) Performed: Procedure(s) (LRB): POSTERIOR REPAIR (RECTOCELE) WITH ENTEROCELE REPAIR (N/A)  Patient Location: PACU  Anesthesia Type: General  Level of Consciousness: awake, oriented, sedated and patient cooperative  Airway & Oxygen Therapy: Patient Spontanous Breathing and Patient connected to face mask oxygen  Post-op Assessment: Report given to PACU RN and Post -op Vital signs reviewed and stable  Post vital signs: Reviewed and stable  Complications: No apparent anesthesia complications Last Vitals:  Vitals:   08/14/17 0726 08/14/17 1120  BP: 139/68 (!) (P) 145/60  Pulse: 73 (P) 84  Resp: 18 (P) 15  Temp: (!) 36.4 C (P) 36.5 C  SpO2: 99% (P) 95%    Last Pain:  Vitals:   08/14/17 0837  TempSrc:   PainSc: 6

## 2017-08-14 NOTE — Op Note (Signed)
Preoperative diagnosis: Symptomatic rectocele  Postoperative diagnosis: Rectocele and enterocele Procedure: Posterior repair and ligation of enterocele sac Surgeon: Cheri Fowler M.D. Asst.: Paula Compton, MD Anesthesia: GETA Findings: She had a minimal cystocele and a grade 3 rectocele with a small enterocele estimated blood loss: 50 cc Complications: None Specimens: None  Procedure in detail:  The patient was taken to the operating room and placed in the dorsosupine position. General anesthesia was induced and her legs were placed in mobile stirrups. Perineum and vagina were then prepped and draped in usual sterile fashion and bladder drained with a Robinson catheter. A weighted speculum was placed in the vagina, only a small cystocele was present and it did not require repair. The hymenal ring was grasped with 2 Allis clamps at a distance that when brought together would still allow two fingers to  easily pass into the vagina. A horizontal piece of tissue was removed. The posterior vaginal mucosa was then dissected in the midline from the hymenal ring to the vaginal apex. Rectocele was then mobilized bilaterally sharply and bluntly.  With a rectal exam there appeared to be an enterocele sac superior to the rectocele.  With some difficulty and careful dissection, I was eventually able to identify and enter the peritoneal cavity through the enterocele sac. A pursestring suture of 0 Vicryl was placed around this enterocele to reduce it. A small amount of redundant tissue and enterocele sac was removed with the Bovie. The rectocele was then reduced with interrupted sutures of 2-0 Vicryl after a rectal exam confirmed this was all rectocele. This achieved good reduction. A repeat rectal exam confirmed good reduction of the rectocele. Excess vaginal mucosa was then removed sharply. Vagina was closed from the vaginal apex to the hymenal ring with running locking 2-0 Vicryl. This achieved adequate closure  and adequate hemostasis. The vagina was then packed with 2 inch gauze coated with Estrace cream. A Foley catheter was also placed. The patient tolerated the procedure well. She was awakened and taken to the recovery in stable condition. Counts were correct, she had PAS hose on throughout the procedure. She received Ancef 2 g for surgical prophylaxis.

## 2017-08-14 NOTE — Progress Notes (Signed)
Day of Surgery Procedure(s) (LRB): POSTERIOR REPAIR (RECTOCELE) WITH ENTEROCELE REPAIR (N/A)  Subjective: Patient reports tolerating PO  Pressure in vagina.  Worried about pain, but states is controlled  Objective: I have reviewed patient's vital signs, intake and output and medications.  General: alert and no distress Resp: clear to auscultation bilaterally Cardio: regular rate and rhythm GI: soft, non-tender; bowel sounds normal; no masses,  no organomegaly Extremities: extremities normal, atraumatic, no cyanosis or edema  Assessment: s/p Procedure(s) with comments: POSTERIOR REPAIR (RECTOCELE) WITH ENTEROCELE REPAIR (N/A) - Outpatient extended recovery: stable, progressing well and tolerating diet  Plan: Encourage ambulation, routine Postop care, Many questions about procedure - deferred to Dr Willis Modena.    LOS: 0 days    Candice [name] 08/14/2017, 6:21 PM

## 2017-08-15 DIAGNOSIS — N815 Vaginal enterocele: Secondary | ICD-10-CM | POA: Diagnosis not present

## 2017-08-15 DIAGNOSIS — N8110 Cystocele, unspecified: Secondary | ICD-10-CM | POA: Diagnosis not present

## 2017-08-15 DIAGNOSIS — F329 Major depressive disorder, single episode, unspecified: Secondary | ICD-10-CM | POA: Diagnosis not present

## 2017-08-15 DIAGNOSIS — N816 Rectocele: Secondary | ICD-10-CM | POA: Diagnosis not present

## 2017-08-15 DIAGNOSIS — K219 Gastro-esophageal reflux disease without esophagitis: Secondary | ICD-10-CM | POA: Diagnosis not present

## 2017-08-15 DIAGNOSIS — I10 Essential (primary) hypertension: Secondary | ICD-10-CM | POA: Diagnosis not present

## 2017-08-15 LAB — CBC
HCT: 29.1 % — ABNORMAL LOW (ref 36.0–46.0)
Hemoglobin: 9.4 g/dL — ABNORMAL LOW (ref 12.0–15.0)
MCH: 30.1 pg (ref 26.0–34.0)
MCHC: 32.3 g/dL (ref 30.0–36.0)
MCV: 93.3 fL (ref 78.0–100.0)
Platelets: 285 10*3/uL (ref 150–400)
RBC: 3.12 MIL/uL — ABNORMAL LOW (ref 3.87–5.11)
RDW: 14.7 % (ref 11.5–15.5)
WBC: 12.7 10*3/uL — ABNORMAL HIGH (ref 4.0–10.5)

## 2017-08-15 MED ORDER — KETOROLAC TROMETHAMINE 30 MG/ML IJ SOLN
INTRAMUSCULAR | Status: AC
  Filled 2017-08-15: qty 1

## 2017-08-15 MED ORDER — IBUPROFEN 600 MG PO TABS: 600.0000 mg | ORAL_TABLET | Freq: Four times a day (QID) | ORAL | 0 refills | Status: DC | PRN

## 2017-08-15 MED ORDER — OXYCODONE-ACETAMINOPHEN 5-325 MG PO TABS
ORAL_TABLET | ORAL | Status: AC
  Filled 2017-08-15: qty 1

## 2017-08-15 MED ORDER — OXYCODONE-ACETAMINOPHEN 5-325 MG PO TABS: 1.0000 | ORAL_TABLET | ORAL | 0 refills | Status: DC | PRN

## 2017-08-15 MED ORDER — SODIUM CHLORIDE 0.9 % IJ SOLN
INTRAMUSCULAR | Status: AC
  Filled 2017-08-15: qty 10

## 2017-08-15 NOTE — Discharge Summary (Signed)
Physician Discharge Summary  Patient ID: Candice [name] MRN: [id number] DOB/AGE: 1951/02/07 66 y.o.  Admit date: 08/14/2017 Discharge date: 08/15/2017  Admission Diagnoses:  Symptomatic rectocele  Discharge Diagnoses: Symptomatic rectocele, enterocele Active Problems:   Rectocele   Acquired vaginal enterocele   Discharged Condition: good  Hospital Course: Pt underwent posterior repair and enterocele repair without complications.  No problems overnight, Hgb stable, voiding, stable for discharge  Discharge Exam: Blood pressure 125/62, pulse 66, temperature (!) 97.5 F (36.4 C), temperature source Oral, resp. rate 18, height 5\' 6"  (1.676 m), weight 201 lb 8 oz (91.4 kg), SpO2 98 %. General appearance: alert  Disposition: 01-Home or Self Care  Discharge Instructions    Call MD for:  difficulty breathing, headache or visual disturbances    Complete by:  As directed    Call MD for:  extreme fatigue    Complete by:  As directed    Call MD for:  persistant dizziness or light-headedness    Complete by:  As directed    Call MD for:  persistant nausea and vomiting    Complete by:  As directed    Call MD for:  severe uncontrolled pain    Complete by:  As directed    Call MD for:  temperature >100.4    Complete by:  As directed    Diet - low sodium heart healthy    Complete by:  As directed    Increase activity slowly    Complete by:  As directed    Lifting restrictions    Complete by:  As directed    10 lbs   Sexual Activity Restrictions    Complete by:  As directed    Pelvic rest     Allergies as of 08/15/2017      Reactions   Codeine Nausea Only   Sulfur Rash      Medication List    TAKE these medications   acyclovir 400 MG tablet Commonly known as:  ZOVIRAX Take 400 mg by mouth daily as needed.   ALLI 60 MG capsule Generic drug:  orlistat Take 60 mg by mouth 2 (two) times daily.   citalopram 20 MG tablet Commonly known as:  CELEXA Take 20 mg by mouth every  morning.   clonazePAM 0.5 MG tablet Commonly known as:  KLONOPIN Take 0.25 mg by mouth at bedtime.   diclofenac 75 MG EC tablet Commonly known as:  VOLTAREN Take 75 mg by mouth 2 (two) times daily.   fish oil-omega-3 fatty acids 1000 MG capsule Take 1 g by mouth daily.   Fluticasone-Salmeterol 250-50 MCG/DOSE Aepb Commonly known as:  ADVAIR Inhale 1 puff into the lungs daily as needed (for shortness of breath).   HAIR SKIN AND NAILS FORMULA PO Take by mouth. 5000 mcg biotin 1 tab per day   ibuprofen 600 MG tablet Commonly known as:  ADVIL,MOTRIN Take 1 tablet (600 mg total) by mouth every 6 (six) hours as needed (mild pain).   LINZESS 145 MCG Caps capsule Generic drug:  linaclotide Take 145 mcg by mouth daily as needed (for constipation).   loratadine 10 MG tablet Commonly known as:  CLARITIN Take 10 mg by mouth daily.   olmesartan-hydrochlorothiazide 20-12.5 MG tablet Commonly known as:  BENICAR HCT Take 1 tablet by mouth daily.   OVER THE COUNTER MEDICATION Take 1 capsule by mouth 2 (two) times daily. Lipozene   OVER THE COUNTER MEDICATION Take 1 capsule by mouth daily. OTC Abs Cut Belly  Fat   oxyCODONE-acetaminophen 5-325 MG tablet Commonly known as:  PERCOCET/ROXICET Take 1-2 tablets by mouth every 4 (four) hours as needed for severe pain.   pantoprazole 40 MG tablet Commonly known as:  PROTONIX Take 40 mg by mouth daily. Heartburn   PROAIR HFA 108 (90 Base) MCG/ACT inhaler Generic drug:  albuterol Take 2 puffs by mouth 4 (four) times daily as needed for wheezing or shortness of breath.   UNABLE TO FIND Co q 10 100 mg liquid  q day   UNABLE TO FIND Focus factor for brain one per day   Vitamin D 2000 units Caps Take 2,000 Units by mouth daily. Vit d 3      Follow-up Information    Mckensey Berghuis, MD. Schedule an appointment as soon as possible for a visit in 6 week(s).   Specialty:  Obstetrics and Gynecology Contact information: [address], [place] [address] [place] [address] [phone]           Signed: Blane Ohara Less Woolsey 08/15/2017, 9:00 AM

## 2017-08-15 NOTE — Discharge Instructions (Signed)
Routine instructions for colporrhaphy

## 2017-08-15 NOTE — Progress Notes (Signed)
Klonopin .25mg  tablet wasted in narcotic box.  Witnessed by Romie Jumper, RN

## 2017-08-15 NOTE — Progress Notes (Signed)
1 Day Post-Op Procedure(s) (LRB): POSTERIOR REPAIR (RECTOCELE) WITH ENTEROCELE REPAIR (N/A)  Subjective: Patient reports incisional pain, tolerating PO and no problems voiding.    Objective: I have reviewed patient's vital signs, intake and output and labs.  General: alert GI: soft, NT Vaginal Bleeding: minimal  Assessment: s/p Procedure(s) with comments: POSTERIOR REPAIR (RECTOCELE) WITH ENTEROCELE REPAIR (N/A) - Outpatient extended recovery: stable and progressing well  Plan: Advance diet Encourage ambulation Advance to PO medication Discontinue IV fluids Discharge home  LOS: 0 days    Candice [name] 08/15/2017, 8:56 AM

## 2017-08-15 NOTE — Progress Notes (Addendum)
Pt's vaginal pkg and foley catheter removed per order.  Small amt of bldy drng noted on peripad.  Peripad changed. Pt tolerated procedure well.  Instructed pt to call when wanting to go to [place] or walk

## 2017-08-16 ENCOUNTER — Encounter (HOSPITAL_BASED_OUTPATIENT_CLINIC_OR_DEPARTMENT_OTHER): Payer: Self-pay | Admitting: Obstetrics and Gynecology

## 2017-09-06 DIAGNOSIS — M25561 Pain in right knee: Secondary | ICD-10-CM | POA: Diagnosis not present

## 2017-09-06 DIAGNOSIS — M1711 Unilateral primary osteoarthritis, right knee: Secondary | ICD-10-CM | POA: Diagnosis not present

## 2017-09-07 DIAGNOSIS — H52223 Regular astigmatism, bilateral: Secondary | ICD-10-CM | POA: Diagnosis not present

## 2017-09-07 DIAGNOSIS — H2513 Age-related nuclear cataract, bilateral: Secondary | ICD-10-CM | POA: Diagnosis not present

## 2017-09-07 DIAGNOSIS — H5203 Hypermetropia, bilateral: Secondary | ICD-10-CM | POA: Diagnosis not present

## 2017-09-07 DIAGNOSIS — H16223 Keratoconjunctivitis sicca, not specified as Sjogren's, bilateral: Secondary | ICD-10-CM | POA: Diagnosis not present

## 2017-09-13 DIAGNOSIS — M25561 Pain in right knee: Secondary | ICD-10-CM | POA: Diagnosis not present

## 2017-09-13 DIAGNOSIS — M1711 Unilateral primary osteoarthritis, right knee: Secondary | ICD-10-CM | POA: Diagnosis not present

## 2017-09-17 DIAGNOSIS — M79672 Pain in left foot: Secondary | ICD-10-CM | POA: Diagnosis not present

## 2017-09-17 DIAGNOSIS — M79671 Pain in right foot: Secondary | ICD-10-CM | POA: Diagnosis not present

## 2017-09-17 DIAGNOSIS — M25579 Pain in unspecified ankle and joints of unspecified foot: Secondary | ICD-10-CM | POA: Diagnosis not present

## 2017-09-20 DIAGNOSIS — M25561 Pain in right knee: Secondary | ICD-10-CM | POA: Diagnosis not present

## 2017-09-20 DIAGNOSIS — M1711 Unilateral primary osteoarthritis, right knee: Secondary | ICD-10-CM | POA: Diagnosis not present

## 2017-09-27 DIAGNOSIS — M1711 Unilateral primary osteoarthritis, right knee: Secondary | ICD-10-CM | POA: Diagnosis not present

## 2017-09-27 DIAGNOSIS — M25561 Pain in right knee: Secondary | ICD-10-CM | POA: Diagnosis not present

## 2017-10-23 DIAGNOSIS — D509 Iron deficiency anemia, unspecified: Secondary | ICD-10-CM | POA: Diagnosis not present

## 2017-10-23 DIAGNOSIS — F329 Major depressive disorder, single episode, unspecified: Secondary | ICD-10-CM | POA: Diagnosis not present

## 2017-10-23 DIAGNOSIS — K219 Gastro-esophageal reflux disease without esophagitis: Secondary | ICD-10-CM | POA: Diagnosis not present

## 2017-10-23 DIAGNOSIS — F419 Anxiety disorder, unspecified: Secondary | ICD-10-CM | POA: Diagnosis not present

## 2017-10-23 DIAGNOSIS — I10 Essential (primary) hypertension: Secondary | ICD-10-CM | POA: Diagnosis not present

## 2017-10-23 DIAGNOSIS — R0602 Shortness of breath: Secondary | ICD-10-CM | POA: Diagnosis not present

## 2017-10-23 DIAGNOSIS — D649 Anemia, unspecified: Secondary | ICD-10-CM | POA: Diagnosis not present

## 2017-10-23 DIAGNOSIS — G629 Polyneuropathy, unspecified: Secondary | ICD-10-CM | POA: Diagnosis not present

## 2017-10-24 DIAGNOSIS — Z6834 Body mass index (BMI) 34.0-34.9, adult: Secondary | ICD-10-CM | POA: Diagnosis not present

## 2017-10-24 DIAGNOSIS — Z882 Allergy status to sulfonamides status: Secondary | ICD-10-CM | POA: Diagnosis not present

## 2017-10-24 DIAGNOSIS — Z7951 Long term (current) use of inhaled steroids: Secondary | ICD-10-CM | POA: Diagnosis not present

## 2017-10-24 DIAGNOSIS — D509 Iron deficiency anemia, unspecified: Secondary | ICD-10-CM | POA: Diagnosis not present

## 2017-10-24 DIAGNOSIS — Z886 Allergy status to analgesic agent status: Secondary | ICD-10-CM | POA: Diagnosis not present

## 2017-10-24 DIAGNOSIS — I10 Essential (primary) hypertension: Secondary | ICD-10-CM | POA: Diagnosis present

## 2017-10-24 DIAGNOSIS — F329 Major depressive disorder, single episode, unspecified: Secondary | ICD-10-CM | POA: Diagnosis present

## 2017-10-24 DIAGNOSIS — R0602 Shortness of breath: Secondary | ICD-10-CM | POA: Diagnosis not present

## 2017-10-24 DIAGNOSIS — D649 Anemia, unspecified: Secondary | ICD-10-CM | POA: Diagnosis not present

## 2017-10-24 DIAGNOSIS — R002 Palpitations: Secondary | ICD-10-CM | POA: Diagnosis not present

## 2017-10-24 DIAGNOSIS — Z79899 Other long term (current) drug therapy: Secondary | ICD-10-CM | POA: Diagnosis not present

## 2017-10-24 DIAGNOSIS — E669 Obesity, unspecified: Secondary | ICD-10-CM | POA: Diagnosis present

## 2017-10-24 DIAGNOSIS — K219 Gastro-esophageal reflux disease without esophagitis: Secondary | ICD-10-CM | POA: Diagnosis present

## 2017-10-24 DIAGNOSIS — G629 Polyneuropathy, unspecified: Secondary | ICD-10-CM | POA: Diagnosis present

## 2017-10-24 DIAGNOSIS — F419 Anxiety disorder, unspecified: Secondary | ICD-10-CM | POA: Diagnosis present

## 2017-10-24 DIAGNOSIS — Z7982 Long term (current) use of aspirin: Secondary | ICD-10-CM | POA: Diagnosis not present

## 2017-11-07 DIAGNOSIS — D529 Folate deficiency anemia, unspecified: Secondary | ICD-10-CM | POA: Diagnosis not present

## 2017-11-07 DIAGNOSIS — D508 Other iron deficiency anemias: Secondary | ICD-10-CM | POA: Diagnosis not present

## 2017-11-07 DIAGNOSIS — D519 Vitamin B12 deficiency anemia, unspecified: Secondary | ICD-10-CM | POA: Diagnosis not present

## 2017-11-08 DIAGNOSIS — M810 Age-related osteoporosis without current pathological fracture: Secondary | ICD-10-CM | POA: Diagnosis not present

## 2017-11-08 DIAGNOSIS — Z13820 Encounter for screening for osteoporosis: Secondary | ICD-10-CM | POA: Diagnosis not present

## 2017-11-12 DIAGNOSIS — Z1231 Encounter for screening mammogram for malignant neoplasm of breast: Secondary | ICD-10-CM | POA: Diagnosis not present

## 2017-11-19 DIAGNOSIS — M79672 Pain in left foot: Secondary | ICD-10-CM | POA: Diagnosis not present

## 2017-11-19 DIAGNOSIS — M25579 Pain in unspecified ankle and joints of unspecified foot: Secondary | ICD-10-CM | POA: Diagnosis not present

## 2017-11-19 DIAGNOSIS — M722 Plantar fascial fibromatosis: Secondary | ICD-10-CM | POA: Diagnosis not present

## 2017-11-19 DIAGNOSIS — M79671 Pain in right foot: Secondary | ICD-10-CM | POA: Diagnosis not present

## 2017-11-29 ENCOUNTER — Encounter: Payer: Self-pay | Admitting: Family Medicine

## 2017-11-29 DIAGNOSIS — I10 Essential (primary) hypertension: Secondary | ICD-10-CM | POA: Diagnosis not present

## 2017-11-29 DIAGNOSIS — F411 Generalized anxiety disorder: Secondary | ICD-10-CM | POA: Diagnosis not present

## 2017-11-29 DIAGNOSIS — Z6835 Body mass index (BMI) 35.0-35.9, adult: Secondary | ICD-10-CM | POA: Diagnosis not present

## 2017-11-29 DIAGNOSIS — E559 Vitamin D deficiency, unspecified: Secondary | ICD-10-CM | POA: Diagnosis not present

## 2017-11-29 DIAGNOSIS — R739 Hyperglycemia, unspecified: Secondary | ICD-10-CM | POA: Diagnosis not present

## 2017-11-29 DIAGNOSIS — E782 Mixed hyperlipidemia: Secondary | ICD-10-CM | POA: Diagnosis not present

## 2017-11-29 DIAGNOSIS — F331 Major depressive disorder, recurrent, moderate: Secondary | ICD-10-CM | POA: Diagnosis not present

## 2017-11-29 DIAGNOSIS — J45909 Unspecified asthma, uncomplicated: Secondary | ICD-10-CM | POA: Diagnosis not present

## 2017-11-29 DIAGNOSIS — K210 Gastro-esophageal reflux disease with esophagitis: Secondary | ICD-10-CM | POA: Diagnosis not present

## 2017-11-29 DIAGNOSIS — D508 Other iron deficiency anemias: Secondary | ICD-10-CM | POA: Diagnosis not present

## 2018-01-03 DIAGNOSIS — M1711 Unilateral primary osteoarthritis, right knee: Secondary | ICD-10-CM | POA: Diagnosis not present

## 2018-01-03 DIAGNOSIS — M25561 Pain in right knee: Secondary | ICD-10-CM | POA: Diagnosis not present

## 2018-01-10 DIAGNOSIS — M1711 Unilateral primary osteoarthritis, right knee: Secondary | ICD-10-CM | POA: Diagnosis not present

## 2018-01-10 DIAGNOSIS — M25561 Pain in right knee: Secondary | ICD-10-CM | POA: Diagnosis not present

## 2018-01-15 DIAGNOSIS — M25561 Pain in right knee: Secondary | ICD-10-CM | POA: Diagnosis not present

## 2018-01-15 DIAGNOSIS — M1711 Unilateral primary osteoarthritis, right knee: Secondary | ICD-10-CM | POA: Diagnosis not present

## 2018-01-21 DIAGNOSIS — M722 Plantar fascial fibromatosis: Secondary | ICD-10-CM | POA: Diagnosis not present

## 2018-01-21 DIAGNOSIS — M79672 Pain in left foot: Secondary | ICD-10-CM | POA: Diagnosis not present

## 2018-01-21 DIAGNOSIS — M25579 Pain in unspecified ankle and joints of unspecified foot: Secondary | ICD-10-CM | POA: Diagnosis not present

## 2018-01-21 DIAGNOSIS — M79671 Pain in right foot: Secondary | ICD-10-CM | POA: Diagnosis not present

## 2018-02-01 ENCOUNTER — Encounter: Payer: Self-pay | Admitting: Adult Health

## 2018-02-01 DIAGNOSIS — D508 Other iron deficiency anemias: Secondary | ICD-10-CM | POA: Diagnosis not present

## 2018-02-01 DIAGNOSIS — D519 Vitamin B12 deficiency anemia, unspecified: Secondary | ICD-10-CM | POA: Diagnosis not present

## 2018-02-21 ENCOUNTER — Encounter (HOSPITAL_COMMUNITY): Payer: Self-pay | Admitting: Hematology

## 2018-02-21 ENCOUNTER — Inpatient Hospital Stay (HOSPITAL_COMMUNITY): Payer: Medicare Other

## 2018-02-21 ENCOUNTER — Inpatient Hospital Stay (HOSPITAL_COMMUNITY): Payer: Medicare Other | Attending: Hematology | Admitting: Hematology

## 2018-02-21 ENCOUNTER — Other Ambulatory Visit: Payer: Self-pay

## 2018-02-21 DIAGNOSIS — R5383 Other fatigue: Secondary | ICD-10-CM | POA: Diagnosis not present

## 2018-02-21 DIAGNOSIS — K644 Residual hemorrhoidal skin tags: Secondary | ICD-10-CM | POA: Diagnosis not present

## 2018-02-21 DIAGNOSIS — K5790 Diverticulosis of intestine, part unspecified, without perforation or abscess without bleeding: Secondary | ICD-10-CM | POA: Diagnosis not present

## 2018-02-21 DIAGNOSIS — R42 Dizziness and giddiness: Secondary | ICD-10-CM | POA: Diagnosis not present

## 2018-02-21 DIAGNOSIS — K219 Gastro-esophageal reflux disease without esophagitis: Secondary | ICD-10-CM | POA: Diagnosis not present

## 2018-02-21 DIAGNOSIS — Z79899 Other long term (current) drug therapy: Secondary | ICD-10-CM | POA: Diagnosis not present

## 2018-02-21 DIAGNOSIS — D649 Anemia, unspecified: Secondary | ICD-10-CM

## 2018-02-21 DIAGNOSIS — Z87891 Personal history of nicotine dependence: Secondary | ICD-10-CM | POA: Insufficient documentation

## 2018-02-21 LAB — CBC
HCT: 28.9 % — ABNORMAL LOW (ref 36.0–46.0)
Hemoglobin: 8.9 g/dL — ABNORMAL LOW (ref 12.0–15.0)
MCH: 29.7 pg (ref 26.0–34.0)
MCHC: 30.8 g/dL (ref 30.0–36.0)
MCV: 96.3 fL (ref 78.0–100.0)
Platelets: 387 10*3/uL (ref 150–400)
RBC: 3 MIL/uL — ABNORMAL LOW (ref 3.87–5.11)
RDW: 15.4 % (ref 11.5–15.5)
WBC: 7.4 10*3/uL (ref 4.0–10.5)

## 2018-02-21 LAB — DIFFERENTIAL
Basophils Absolute: 0 10*3/uL (ref 0.0–0.1)
Basophils Relative: 0 %
Eosinophils Absolute: 0.1 10*3/uL (ref 0.0–0.7)
Eosinophils Relative: 2 %
Lymphocytes Relative: 16 %
Lymphs Abs: 1.2 10*3/uL (ref 0.7–4.0)
Monocytes Absolute: 0.7 10*3/uL (ref 0.1–1.0)
Monocytes Relative: 10 %
Neutro Abs: 5.4 10*3/uL (ref 1.7–7.7)
Neutrophils Relative %: 72 %

## 2018-02-21 LAB — RETICULOCYTES
RBC.: 3 MIL/uL — ABNORMAL LOW (ref 3.87–5.11)
Retic Count, Absolute: 117 10*3/uL (ref 19.0–186.0)
Retic Ct Pct: 3.9 % — ABNORMAL HIGH (ref 0.4–3.1)

## 2018-02-21 LAB — BASIC METABOLIC PANEL
Anion gap: 11 (ref 5–15)
BUN: 24 mg/dL — ABNORMAL HIGH (ref 6–20)
CO2: 26 mmol/L (ref 22–32)
Calcium: 9.8 mg/dL (ref 8.9–10.3)
Chloride: 102 mmol/L (ref 101–111)
Creatinine, Ser: 1.26 mg/dL — ABNORMAL HIGH (ref 0.44–1.00)
GFR calc Af Amer: 49 mL/min — ABNORMAL LOW (ref >60)
GFR calc non Af Amer: 42 mL/min — ABNORMAL LOW (ref >60)
Glucose, Bld: 109 mg/dL — ABNORMAL HIGH (ref 65–99)
Potassium: 3.9 mmol/L (ref 3.5–5.1)
Sodium: 139 mmol/L (ref 135–145)

## 2018-02-21 LAB — TECHNOLOGIST SMEAR REVIEW

## 2018-02-21 LAB — LACTATE DEHYDROGENASE: LDH: 158 U/L (ref 98–192)

## 2018-02-21 NOTE — Assessment & Plan Note (Signed)
1.  Normocytic anemia: Her most recent hemoglobin on 02/01/2018 at Dr. Edythe Lynn office was down to 9.3.  Prior to that it was 9.6 in January and 10.3 in September of last year.  Her MCV was within normal limits.  Her ferritin was low at 21.  She was also found to have a mildly elevated creatinine of 1.07 in January 2019.  Her creatinine was 1 in October 2018.  She had a colonoscopy in September 2019 which showed diverticulosis and external hemorrhoids.  She has been on 1 iron tablet daily for the past 1-2 years.  She does get constipated from it.  She does have occasional hemorrhoidal bleeding when she gets constipation.  She easily gets tired when she exercises.  She received about 2 units of blood transfusion 3 months ago.  The differential diagnosis for her normocytic anemia is iron deficiency state, chronic kidney disease and poor absorption of iron.  Hence I have recommended parenteral iron with Feraheme weekly x2.  We talked about the side effects including but not limited to rare chance of serious allergic reactions.  She understands and gives his permission to proceed with the treatment.  We plan to start her on Feraheme next week.  I have sent a CBC today and will review a smear.  We will also check SPEP, haptoglobin, LDH, reticulocyte count and erythropoietin.  Her S81 and folic acid were within normal limits at Dr. Edythe Lynn office.  I will reevaluate her in 6 weeks and repeat her CBC.

## 2018-02-21 NOTE — Progress Notes (Signed)
CONSULT NOTE  Patient Care Team: Sasser, Silvestre Moment, MD as PCP - General (Cardiology)  CHIEF COMPLAINTS/PURPOSE OF CONSULTATION:  Normocytic anemia  HISTORY OF PRESENTING ILLNESS:  Candice [name] 67 y.o. female is seen in consultation today for further workup of normocytic anemia.  Her CBC on 02/01/2018 shows hemoglobin of 9.3 and hematocrit of 29.6.  White count and platelet count and differential were within normal limits.  Serum ferritin was 21 with a normal folic acid and vitamin B12.  Prior to that in January her hemoglobin was 9.7.  In September 2018, it was around 10.3.  She reports that she had to receive 2 units of blood transfusion about 3-4 months ago.  Her last colonoscopy was in September 2018 which showed diverticulosis and external hemorrhoids.  She reports occasional hemorrhoidal bleeding when she is constipated.  She denies any melena.  She denies any fevers, night sweats or weight loss.  She feels tired easily.  She exercises daily.  She occasionally feels lightheaded after exercise.  She has been taking iron tablet daily for the past 1-2 years.  She does not report any pica and eats a variety of diet.  She denies any NSAID use.    MEDICAL HISTORY:  Past Medical History:  Diagnosis Date  . Anxiety   . Asthma   . Chronic constipation   . Diverticulosis of colon   . Dyspnea    sob with exertion  . GERD (gastroesophageal reflux disease)   . Hemorrhoids   . Herpes   . History of lumpectomy of right breast   . Hypertension   . Major depression   . Rectocele    W/ POSSIBLE CYSTOCELE    SURGICAL HISTORY: Past Surgical History:  Procedure Laterality Date  . BREAST SURGERY Right 01/17/2007   dr tim davis   excisional breast mass (papilloma)  . COLONOSCOPY  10/30/2012   Procedure: COLONOSCOPY;  Surgeon: Rogene Houston, MD;  [facility];  Service: Endoscopy;  Laterality: N/A;  200  . COLONOSCOPY N/A 07/24/2017   Procedure: COLONOSCOPY;  Surgeon: Rogene Houston, MD;  [facility];  Service: Endoscopy;  Laterality: N/A;  1230  . ESOPHAGOGASTRODUODENOSCOPY  10/30/2012   Procedure: ESOPHAGOGASTRODUODENOSCOPY (EGD);  Surgeon: Rogene Houston, MD;  [facility];  Service: Endoscopy;  Laterality: N/A;  . [place]  . NEEDLE-GUIDED EXCISION RIGHT BREAST MASS  07-13-2011  dr Margot Chimes  . RECTOCELE REPAIR N/A 08/14/2017   Procedure: POSTERIOR REPAIR (RECTOCELE) WITH ENTEROCELE REPAIR;  Surgeon: Cheri Fowler, MD;  Location: [place];  Service: Gynecology;  Laterality: N/A;  Outpatient extended recovery  . VAGINAL HYSTERECTOMY     complete    SOCIAL HISTORY: Social History   Socioeconomic History  . Marital status: Divorced    Spouse name: Not on file  . Number of children: Not on file  . Years of education: Not on file  . Highest education level: Not on file  Occupational History  . Not on file  Social Needs  . Financial resource strain: Not on file  . Food insecurity:    Worry: Not on file    Inability: Not on file  . Transportation needs:    Medical: Not on file    Non-medical: Not on file  Tobacco Use  . Smoking status: Former Research scientist (life sciences)  . Smokeless tobacco: Never Used  . Tobacco comment: years ago  Substance and Sexual Activity  . Alcohol use: Yes  Comment: occ wine  . Drug use: No  . Sexual activity: Not on file  Lifestyle  . Physical activity:    Days per week: Not on file    Minutes per session: Not on file  . Stress: Not on file  Relationships  . Social connections:    Talks on phone: Not on file    Gets together: Not on file    Attends religious service: Not on file    Active member of club or organization: Not on file    Attends meetings of clubs or organizations: Not on file    Relationship status: Not on file  . Intimate partner violence:    Fear of current or ex partner: Not on file    Emotionally abused: Not on file    Physically abused: Not on file     Forced sexual activity: Not on file  Other Topics Concern  . Not on file  Social History Narrative  . Not on file    FAMILY HISTORY: History reviewed. No pertinent family history.  ALLERGIES:  is allergic to codeine and sulfur.  MEDICATIONS:  Current Outpatient Medications  Medication Sig Dispense Refill  . acyclovir (ZOVIRAX) 400 MG tablet Take 400 mg by mouth daily as needed.     . Cholecalciferol (VITAMIN D) 2000 units CAPS Take 2,000 Units by mouth daily. Vit d 3    . citalopram (CELEXA) 20 MG tablet Take 20 mg by mouth every morning.     . clonazePAM (KLONOPIN) 0.5 MG tablet Take 0.25 mg by mouth at bedtime.    . diclofenac (VOLTAREN) 75 MG EC tablet Take 75 mg by mouth 2 (two) times daily.    . fish oil-omega-3 fatty acids 1000 MG capsule Take 1 g by mouth daily.     . Fluticasone-Salmeterol (ADVAIR) 250-50 MCG/DOSE AEPB Inhale 1 puff into the lungs daily as needed (for shortness of breath).     Marland Kitchen ibuprofen (ADVIL,MOTRIN) 600 MG tablet Take 1 tablet (600 mg total) by mouth every 6 (six) hours as needed (mild pain). 30 tablet 0  . linaclotide (LINZESS) 145 MCG CAPS capsule Take 145 mcg by mouth daily as needed (for constipation).     Marland Kitchen loratadine (CLARITIN) 10 MG tablet Take 10 mg by mouth daily.    . Multiple Vitamins-Minerals (HAIR SKIN AND NAILS FORMULA PO) Take by mouth. 5000 mcg biotin 1 tab per day    . olmesartan-hydrochlorothiazide (BENICAR HCT) 20-12.5 MG per tablet Take 1 tablet by mouth daily.      Marland Kitchen orlistat (ALLI) 60 MG capsule Take 60 mg by mouth 2 (two) times daily.    Marland Kitchen OVER THE COUNTER MEDICATION Take 1 capsule by mouth daily. OTC Abs Cut Belly Fat    . OVER THE COUNTER MEDICATION Take 1 capsule by mouth 2 (two) times daily. Lipozene    . oxyCODONE-acetaminophen (PERCOCET/ROXICET) 5-325 MG tablet Take 1-2 tablets by mouth every 4 (four) hours as needed for severe pain. 15 tablet 0  . pantoprazole (PROTONIX) 40 MG tablet Take 40 mg by mouth daily. Heartburn    .  PROAIR HFA 108 (90 BASE) MCG/ACT inhaler Take 2 puffs by mouth 4 (four) times daily as needed for wheezing or shortness of breath.     Marland Kitchen UNABLE TO FIND Co q 10 100 mg liquid  q day    . UNABLE TO FIND Focus factor for brain one per day     No current facility-administered medications for this visit.  REVIEW OF SYSTEMS:   Constitutional: Denies fevers, chills or abnormal night sweats.  She has some fatigue on exertion. Eyes: Denies blurriness of vision, double vision or watery eyes Ears, nose, mouth, throat, and face: Denies mucositis or sore throat Respiratory: Denies cough, dyspnea or wheezes Cardiovascular: Denies palpitation, chest discomfort or lower extremity swelling Gastrointestinal:  Denies nausea, heartburn or change in bowel habits.  She has some hemorrhoidal bleeding after constipation. Skin: Denies abnormal skin rashes Lymphatics: Denies new lymphadenopathy or easy bruising Neurological:Denies numbness, tingling or new weaknesses.  She has occasional lightheadedness after exercise. Behavioral/Psych: Mood is stable, no new changes  All other systems were reviewed with the patient and are negative.  PHYSICAL EXAMINATION: ECOG PERFORMANCE STATUS: 1 - Symptomatic but completely ambulatory  Vitals:   02/21/18 1119  BP: (!) 132/54  Pulse: (!) 101  Resp: 16  Temp: 98.1 F (36.7 C)  SpO2: 100%   Filed Weights   02/21/18 1119  Weight: 198 lb 8 oz (90 kg)    GENERAL:alert, no distress and comfortable SKIN: skin color, texture, turgor are normal, no rashes or significant lesions EYES: normal, conjunctiva are pink and non-injected, sclera clear OROPHARYNX:no exudate, no erythema and lips, buccal mucosa, and tongue normal  NECK: supple, thyroid normal size, non-tender, without nodularity LYMPH:  no palpable lymphadenopathy in the cervical, axillary or inguinal LUNGS: clear to auscultation and percussion with normal breathing effort HEART: regular rate & rhythm and no  murmurs and no lower extremity edema ABDOMEN:abdomen soft, non-tender and normal bowel sounds Musculoskeletal:no cyanosis of digits and no clubbing  PSYCH: alert & oriented x 3 with fluent speech NEURO: no focal motor/sensory deficits  LABORATORY DATA:  I have reviewed her lab data from Dr. Edythe Lynn office as well as labs from 2018 on epic.  RADIOGRAPHIC STUDIES: I have personally reviewed her colonoscopy report.  ASSESSMENT & PLAN:  Normochromic normocytic anemia 1.  Normocytic anemia: Her most recent hemoglobin on 02/01/2018 at Dr. Edythe Lynn office was down to 9.3.  Prior to that it was 9.6 in January and 10.3 in September of last year.  Her MCV was within normal limits.  Her ferritin was low at 21.  She was also found to have a mildly elevated creatinine of 1.07 in January 2019.  Her creatinine was 1 in October 2018.  She had a colonoscopy in September 2019 which showed diverticulosis and external hemorrhoids.  She has been on 1 iron tablet daily for the past 1-2 years.  She does get constipated from it.  She does have occasional hemorrhoidal bleeding when she gets constipation.  She easily gets tired when she exercises.  She received about 2 units of blood transfusion 3 months ago.  The differential diagnosis for her normocytic anemia is iron deficiency state, chronic kidney disease and poor absorption of iron.  Hence I have recommended parenteral iron with Feraheme weekly x2.  We talked about the side effects including but not limited to rare chance of serious allergic reactions.  She understands and gives his permission to proceed with the treatment.  We plan to start her on Feraheme next week.  I have sent a CBC today and will review a smear.  We will also check SPEP, haptoglobin, LDH, reticulocyte count and erythropoietin.  Her M35 and folic acid were within normal limits at Dr. Edythe Lynn office.  I will reevaluate her in 6 weeks and repeat her CBC.     All questions were answered. The  patient knows to call the clinic  with any problems, questions or concerns. Total time spent is 45 minutes with more than 50% of the time spent face-to-face discussing her diagnosis, treatment options and coordination of care.     Derek Jack, MD 02/21/18 12:23 PM

## 2018-02-22 LAB — PROTEIN ELECTROPHORESIS, SERUM
A/G Ratio: 1.1 (ref 0.7–1.7)
Albumin ELP: 3.7 g/dL (ref 2.9–4.4)
Alpha-1-Globulin: 0.2 g/dL (ref 0.0–0.4)
Alpha-2-Globulin: 0.8 g/dL (ref 0.4–1.0)
Beta Globulin: 1.5 g/dL — ABNORMAL HIGH (ref 0.7–1.3)
Gamma Globulin: 0.9 g/dL (ref 0.4–1.8)
Globulin, Total: 3.4 g/dL (ref 2.2–3.9)
Total Protein ELP: 7.1 g/dL (ref 6.0–8.5)

## 2018-02-22 LAB — ERYTHROPOIETIN: Erythropoietin: 50.7 m[IU]/mL — ABNORMAL HIGH (ref 2.6–18.5)

## 2018-02-22 LAB — HAPTOGLOBIN: Haptoglobin: 161 mg/dL (ref 34–200)

## 2018-02-25 ENCOUNTER — Inpatient Hospital Stay (HOSPITAL_COMMUNITY): Payer: Medicare Other

## 2018-02-25 ENCOUNTER — Encounter (HOSPITAL_COMMUNITY): Payer: Self-pay

## 2018-02-25 VITALS — BP 137/60 | HR 73 | Temp 97.6°F | Resp 18

## 2018-02-25 DIAGNOSIS — R42 Dizziness and giddiness: Secondary | ICD-10-CM | POA: Diagnosis not present

## 2018-02-25 DIAGNOSIS — D649 Anemia, unspecified: Secondary | ICD-10-CM

## 2018-02-25 DIAGNOSIS — R5383 Other fatigue: Secondary | ICD-10-CM | POA: Diagnosis not present

## 2018-02-25 DIAGNOSIS — K5790 Diverticulosis of intestine, part unspecified, without perforation or abscess without bleeding: Secondary | ICD-10-CM | POA: Diagnosis not present

## 2018-02-25 DIAGNOSIS — K219 Gastro-esophageal reflux disease without esophagitis: Secondary | ICD-10-CM | POA: Diagnosis not present

## 2018-02-25 DIAGNOSIS — K644 Residual hemorrhoidal skin tags: Secondary | ICD-10-CM | POA: Diagnosis not present

## 2018-02-25 MED ORDER — SODIUM CHLORIDE 0.9% FLUSH
10.0000 mL | INTRAVENOUS | Status: DC | PRN
  Administered 2018-02-25: 10 mL
  Filled 2018-02-25: qty 10

## 2018-02-25 MED ORDER — FERUMOXYTOL INJECTION 510 MG/17 ML
510.0000 mg | Freq: Once | INTRAVENOUS | Status: AC
  Administered 2018-02-25: 510 mg via INTRAVENOUS
  Filled 2018-02-25: qty 17

## 2018-02-25 MED ORDER — SODIUM CHLORIDE 0.9 % IV SOLN
Freq: Once | INTRAVENOUS | Status: AC
  Administered 2018-02-25: 500 mL via INTRAVENOUS

## 2018-02-25 NOTE — Progress Notes (Signed)
Patient given written information for iron infusion with all questions asked and answered.  Good blood return noted before and after administration of infusion.  Patient tolerated iron infusion with no complaints voiced.  Peripheral IV site clean and dry with no bruising or swelling noted at site.  Denied pain at site.  Band aid applied.  VSs with discharge and left ambulatory with friend.  No s/s of distress noted.

## 2018-02-25 NOTE — Patient Instructions (Signed)
[facility] at [facility]  Discharge Instructions:  Ferumoxytol injection What is this medicine? FERUMOXYTOL is an iron complex. Iron is used to make healthy red blood cells, which carry oxygen and nutrients throughout the body. This medicine is used to treat iron deficiency anemia in people with chronic kidney disease. This medicine may be used for other purposes; ask your health care provider or pharmacist if you have questions. COMMON BRAND NAME(S): Feraheme What should I tell my health care provider before I take this medicine? They need to know if you have any of these conditions: -anemia not caused by low iron levels -high levels of iron in the blood -magnetic resonance imaging (MRI) test scheduled -an unusual or allergic reaction to iron, other medicines, foods, dyes, or preservatives -pregnant or trying to get pregnant -breast-feeding How should I use this medicine? This medicine is for injection into a vein. It is given by a health care professional in a hospital or clinic setting. Talk to your pediatrician regarding the use of this medicine in children. Special care may be needed. Overdosage: If you think you have taken too much of this medicine contact a poison control center or emergency room at once. NOTE: This medicine is only for you. Do not share this medicine with others. What if I miss a dose? It is important not to miss your dose. Call your doctor or health care professional if you are unable to keep an appointment. What may interact with this medicine? This medicine may interact with the following medications: -other iron products This list may not describe all possible interactions. Give your health care provider a list of all the medicines, herbs, non-prescription drugs, or dietary supplements you use. Also tell them if you smoke, drink alcohol, or use illegal drugs. Some items may interact with your medicine. What should I watch for while  using this medicine? Visit your doctor or healthcare professional regularly. Tell your doctor or healthcare professional if your symptoms do not start to get better or if they get worse. You may need blood work done while you are taking this medicine. You may need to follow a special diet. Talk to your doctor. Foods that contain iron include: whole grains/cereals, dried fruits, beans, or peas, leafy green vegetables, and organ meats (liver, kidney). What side effects may I notice from receiving this medicine? Side effects that you should report to your doctor or health care professional as soon as possible: -allergic reactions like skin rash, itching or hives, swelling of the face, lips, or tongue -breathing problems -changes in blood pressure -feeling faint or lightheaded, falls -fever or chills -flushing, sweating, or hot feelings -swelling of the ankles or feet Side effects that usually do not require medical attention (report to your doctor or health care professional if they continue or are bothersome): -diarrhea -headache -nausea, vomiting -stomach pain This list may not describe all possible side effects. Call your doctor for medical advice about side effects. You may report side effects to FDA at 1-800-FDA-1088. Where should I keep my medicine? This drug is given in a hospital or clinic and will not be stored at home. NOTE: This sheet is a summary. It may not cover all possible information. If you have questions about this medicine, talk to your doctor, pharmacist, or health care provider.  2018 Elsevier/Gold Standard (2015-12-02 12:41:49)  _______________________________________________________________  Thank you for choosing [facility] at [facility] to provide your oncology and hematology care.  To   afford each patient quality time with our providers, please arrive at least 15 minutes before your scheduled appointment.  You need to re-schedule your  appointment if you arrive 10 or more minutes late.  We strive to give you quality time with our providers, and arriving late affects you and other patients whose appointments are after yours.  Also, if you no show three or more times for appointments you may be dismissed from the clinic.  Again, thank you for choosing [facility] at [facility]. Our hope is that these requests will allow you access to exceptional care and in a timely manner. _______________________________________________________________  If you have questions after your visit, please contact our office at [phone] between the hours of 8:30 a.m. and 5:00 p.m. Voicemails left after 4:30 p.m. will not be returned until the following business day. _______________________________________________________________  For prescription refill requests, have your pharmacy contact our office. _______________________________________________________________  Recommendations made by the consultant and any test results will be sent to your referring physician. _______________________________________________________________ 

## 2018-03-04 ENCOUNTER — Inpatient Hospital Stay (HOSPITAL_COMMUNITY): Payer: Medicare Other

## 2018-03-04 ENCOUNTER — Encounter (HOSPITAL_COMMUNITY): Payer: Self-pay

## 2018-03-04 ENCOUNTER — Other Ambulatory Visit: Payer: Self-pay

## 2018-03-04 VITALS — BP 109/51 | HR 74 | Temp 98.4°F | Resp 18

## 2018-03-04 DIAGNOSIS — D649 Anemia, unspecified: Secondary | ICD-10-CM

## 2018-03-04 DIAGNOSIS — K219 Gastro-esophageal reflux disease without esophagitis: Secondary | ICD-10-CM | POA: Diagnosis not present

## 2018-03-04 DIAGNOSIS — R5383 Other fatigue: Secondary | ICD-10-CM | POA: Diagnosis not present

## 2018-03-04 DIAGNOSIS — R42 Dizziness and giddiness: Secondary | ICD-10-CM | POA: Diagnosis not present

## 2018-03-04 DIAGNOSIS — K5790 Diverticulosis of intestine, part unspecified, without perforation or abscess without bleeding: Secondary | ICD-10-CM | POA: Diagnosis not present

## 2018-03-04 DIAGNOSIS — K644 Residual hemorrhoidal skin tags: Secondary | ICD-10-CM | POA: Diagnosis not present

## 2018-03-04 MED ORDER — SODIUM CHLORIDE 0.9 % IV SOLN
510.0000 mg | Freq: Once | INTRAVENOUS | Status: AC
  Administered 2018-03-04: 510 mg via INTRAVENOUS
  Filled 2018-03-04: qty 17

## 2018-03-04 MED ORDER — SODIUM CHLORIDE 0.9 % IV SOLN
Freq: Once | INTRAVENOUS | Status: AC
  Administered 2018-03-04: 14:00:00 via INTRAVENOUS

## 2018-03-04 NOTE — Progress Notes (Signed)
Tolerated infusion w/o adverse reaction.  Alert, in no distress.  VSS.  Discharged ambulatory.  

## 2018-04-03 ENCOUNTER — Inpatient Hospital Stay (HOSPITAL_COMMUNITY): Payer: Medicare Other | Attending: Hematology

## 2018-04-03 DIAGNOSIS — D509 Iron deficiency anemia, unspecified: Secondary | ICD-10-CM | POA: Insufficient documentation

## 2018-04-03 DIAGNOSIS — D631 Anemia in chronic kidney disease: Secondary | ICD-10-CM | POA: Insufficient documentation

## 2018-04-03 DIAGNOSIS — I129 Hypertensive chronic kidney disease with stage 1 through stage 4 chronic kidney disease, or unspecified chronic kidney disease: Secondary | ICD-10-CM | POA: Insufficient documentation

## 2018-04-03 DIAGNOSIS — D649 Anemia, unspecified: Secondary | ICD-10-CM

## 2018-04-03 DIAGNOSIS — N189 Chronic kidney disease, unspecified: Secondary | ICD-10-CM | POA: Insufficient documentation

## 2018-04-03 LAB — CBC
HCT: 40 % (ref 36.0–46.0)
Hemoglobin: 12.7 g/dL (ref 12.0–15.0)
MCH: 30.7 pg (ref 26.0–34.0)
MCHC: 31.8 g/dL (ref 30.0–36.0)
MCV: 96.6 fL (ref 78.0–100.0)
Platelets: 269 10*3/uL (ref 150–400)
RBC: 4.14 MIL/uL (ref 3.87–5.11)
RDW: 14.8 % (ref 11.5–15.5)
WBC: 4.6 10*3/uL (ref 4.0–10.5)

## 2018-04-03 LAB — IRON AND TIBC
Iron: 73 ug/dL (ref 28–170)
Saturation Ratios: 23 % (ref 10.4–31.8)
TIBC: 323 ug/dL (ref 250–450)
UIBC: 250 ug/dL

## 2018-04-03 LAB — FERRITIN: Ferritin: 71 ng/mL (ref 11–307)

## 2018-04-09 ENCOUNTER — Inpatient Hospital Stay (HOSPITAL_COMMUNITY): Payer: Medicare Other | Attending: Hematology | Admitting: Hematology

## 2018-04-09 ENCOUNTER — Other Ambulatory Visit (HOSPITAL_COMMUNITY): Payer: Medicare Other

## 2018-04-09 ENCOUNTER — Encounter (HOSPITAL_COMMUNITY): Payer: Self-pay | Admitting: Hematology

## 2018-04-09 ENCOUNTER — Other Ambulatory Visit: Payer: Self-pay

## 2018-04-09 DIAGNOSIS — K219 Gastro-esophageal reflux disease without esophagitis: Secondary | ICD-10-CM | POA: Insufficient documentation

## 2018-04-09 DIAGNOSIS — K5909 Other constipation: Secondary | ICD-10-CM | POA: Insufficient documentation

## 2018-04-09 DIAGNOSIS — I129 Hypertensive chronic kidney disease with stage 1 through stage 4 chronic kidney disease, or unspecified chronic kidney disease: Secondary | ICD-10-CM

## 2018-04-09 DIAGNOSIS — D509 Iron deficiency anemia, unspecified: Secondary | ICD-10-CM | POA: Diagnosis not present

## 2018-04-09 DIAGNOSIS — D631 Anemia in chronic kidney disease: Secondary | ICD-10-CM | POA: Insufficient documentation

## 2018-04-09 DIAGNOSIS — Z79899 Other long term (current) drug therapy: Secondary | ICD-10-CM | POA: Diagnosis not present

## 2018-04-09 DIAGNOSIS — N189 Chronic kidney disease, unspecified: Secondary | ICD-10-CM | POA: Diagnosis not present

## 2018-04-09 DIAGNOSIS — Z87891 Personal history of nicotine dependence: Secondary | ICD-10-CM | POA: Insufficient documentation

## 2018-04-09 DIAGNOSIS — F329 Major depressive disorder, single episode, unspecified: Secondary | ICD-10-CM | POA: Insufficient documentation

## 2018-04-09 DIAGNOSIS — D649 Anemia, unspecified: Secondary | ICD-10-CM

## 2018-04-09 NOTE — Assessment & Plan Note (Signed)
1.  Normocytic anemia: - Combination anemia from relative iron deficiency state, chronic kidney disease and poor absorption. -Patient has been on iron pills for the past 2 years without much help. - Nutritional work-up was negative.  SPEP negative.  LDH was normal.  Serum erythropoietin was 50. - Last Feraheme infusion on 02/25/2018 and 03/04/2018, with improvement of hemoglobin from 8.9-12.27.  Ferritin was 71 and percent saturation was 23.  Patient felt improvement in energy levels.  She is exercising every day.  She also reports increase in appetite since she got iron.  She gained about 5 pounds. -I have recommended 2 more infusions of Feraheme 2 weeks apart.  She tolerated without any reactions.  We will see her back in 3 months with a repeat blood work 2 to 3 days prior.

## 2018-04-09 NOTE — Patient Instructions (Signed)
[facility] at [facility] Discharge Instructions  Today you saw Dr. K.   Thank you for choosing [facility] at [facility] to provide your oncology and hematology care.  To afford each patient quality time with our provider, please arrive at least 15 minutes before your scheduled appointment time.   If you have a lab appointment with the Cancer Center please come in thru the  Main Entrance and check in at the main information desk  You need to re-schedule your appointment should you arrive 10 or more minutes late.  We strive to give you quality time with our providers, and arriving late affects you and other patients whose appointments are after yours.  Also, if you no show three or more times for appointments you may be dismissed from the clinic at the providers discretion.     Again, thank you for choosing [facility].  Our hope is that these requests will decrease the amount of time that you wait before being seen by our physicians.       _____________________________________________________________  Should you have questions after your visit to  Cancer Center, please contact our office at [phone] between the hours of 8:30 a.m. and 4:30 p.m.  Voicemails left after 4:30 p.m. will not be returned until the following business day.  For prescription refill requests, have your pharmacy contact our office.       Resources For Cancer Patients and their Caregivers ? American Cancer Society: Can assist with transportation, wigs, general needs, runs Look Good Feel Better.        1-888-227-6333 ? Cancer Care: Provides financial assistance, online support groups, medication/co-pay assistance.  1-800-813-HOPE (4673) ? [facility] Assists [place] cancer patients and their families through emotional , educational and financial support.  336-427-4357 ? [place] DSS Where to apply for food  stamps, Medicaid and utility assistance. 336-342-1394 ? [facility]: Transportation to medical appointments. 336-347-2287 ? Social Security Administration: May apply for disability if have a Stage IV cancer. 336-342-7796 1-800-772-1213 ? [place] Aging, Disability and Transit Services: Assists with nutrition, care and transit needs. 336-349-2343  Cancer Center Support Programs:   > Cancer Support Group  2nd Tuesday of the month 1pm-2pm, Journey Room   > Creative Journey  3rd Tuesday of the month 1130am-1pm, Journey Room    

## 2018-04-09 NOTE — Progress Notes (Signed)
Candice [name], Candice [name] [id number]   CLINIC:  Medical Oncology/Hematology  PCP:  Manon Hilding, MD [place] [address] [phone]   REASON FOR VISIT:  Follow-up for iron deficiency anemia and kidney disease.  CURRENT THERAPY: Intermittent Feraheme infusions.   INTERVAL HISTORY:  Candice [name] 67 y.o. female returns for follow-up of iron deficiency state.  She has very rare bleeding when she gets constipated.  She is continuing one iron tablet daily.  Iron tablet gives her constipation.  She reports 5 pound weight gain since last visit 6 weeks ago.  She reports that since she has gotten intravenous iron, she developed a lot of appetite.  She received Feraheme infusion on 02/25/2018 and 03/04/2018 without any major problems.  No reactions were reported.  Her energy levels have improved tremendously.  She is able to exercise more.  She denies any lightheadedness after working out at [employer].  Denies any recent ER visits.  She had a history of blood transfusions few months ago.  REVIEW OF SYSTEMS:  Review of Systems  Constitutional: Positive for appetite change and unexpected weight change.  All other systems reviewed and are negative.    PAST MEDICAL/SURGICAL HISTORY:  Past Medical History:  Diagnosis Date  . Anxiety   . Asthma   . Chronic constipation   . Diverticulosis of colon   . Dyspnea    sob with exertion  . GERD (gastroesophageal reflux disease)   . Hemorrhoids   . Herpes   . History of lumpectomy of right breast   . Hypertension   . Major depression   . Rectocele    W/ POSSIBLE CYSTOCELE   Past Surgical History:  Procedure Laterality Date  . BREAST SURGERY Right 01/17/2007   dr tim davis   excisional breast mass (papilloma)  . COLONOSCOPY  10/30/2012   Procedure: COLONOSCOPY;  Surgeon: Rogene Houston, MD;  [facility];  Service: Endoscopy;  Laterality: N/A;  200  . COLONOSCOPY N/A 07/24/2017   Procedure:  COLONOSCOPY;  Surgeon: Rogene Houston, MD;  [facility];  Service: Endoscopy;  Laterality: N/A;  1230  . ESOPHAGOGASTRODUODENOSCOPY  10/30/2012   Procedure: ESOPHAGOGASTRODUODENOSCOPY (EGD);  Surgeon: Rogene Houston, MD;  [facility];  Service: Endoscopy;  Laterality: N/A;  . [place]  . NEEDLE-GUIDED EXCISION RIGHT BREAST MASS  07-13-2011  dr Margot Chimes  . RECTOCELE REPAIR N/A 08/14/2017   Procedure: POSTERIOR REPAIR (RECTOCELE) WITH ENTEROCELE REPAIR;  Surgeon: Cheri Fowler, MD;  Location: [facility];  Service: Gynecology;  Laterality: N/A;  Outpatient extended recovery  . VAGINAL HYSTERECTOMY     complete     SOCIAL HISTORY:  Social History   Socioeconomic History  . Marital status: Divorced    Spouse name: Not on file  . Number of children: Not on file  . Years of education: Not on file  . Highest education level: Not on file  Occupational History  . Not on file  Social Needs  . Financial resource strain: Not on file  . Food insecurity:    Worry: Not on file    Inability: Not on file  . Transportation needs:    Medical: Not on file    Non-medical: Not on file  Tobacco Use  . Smoking status: Former Research scientist (life sciences)  . Smokeless tobacco: Never Used  . Tobacco comment: years ago  Substance and Sexual Activity  . Alcohol use:  Yes    Comment: occ wine  . Drug use: No  . Sexual activity: Not on file  Lifestyle  . Physical activity:    Days per week: Not on file    Minutes per session: Not on file  . Stress: Not on file  Relationships  . Social connections:    Talks on phone: Not on file    Gets together: Not on file    Attends religious service: Not on file    Active member of club or organization: Not on file    Attends meetings of clubs or organizations: Not on file    Relationship status: Not on file  . Intimate partner violence:    Fear of current or ex partner: Not on file    Emotionally abused: Not on file     Physically abused: Not on file    Forced sexual activity: Not on file  Other Topics Concern  . Not on file  Social History Narrative  . Not on file    FAMILY HISTORY:  History reviewed. No pertinent family history.  CURRENT MEDICATIONS:  Outpatient Encounter Medications as of 04/09/2018  Medication Sig  . acyclovir (ZOVIRAX) 400 MG tablet Take 400 mg by mouth daily as needed.   . Cholecalciferol (VITAMIN D) 2000 units CAPS Take 2,000 Units by mouth daily. Vit d 3  . citalopram (CELEXA) 20 MG tablet Take 20 mg by mouth every morning.   . clonazePAM (KLONOPIN) 0.5 MG tablet Take 0.25 mg by mouth at bedtime.  . diclofenac (VOLTAREN) 75 MG EC tablet Take 75 mg by mouth 2 (two) times daily.  . fish oil-omega-3 fatty acids 1000 MG capsule Take 1 g by mouth daily.   . Fluticasone-Salmeterol (ADVAIR) 250-50 MCG/DOSE AEPB Inhale 1 puff into the lungs daily as needed (for shortness of breath).   Marland Kitchen ibuprofen (ADVIL,MOTRIN) 600 MG tablet Take 1 tablet (600 mg total) by mouth every 6 (six) hours as needed (mild pain).  Marland Kitchen linaclotide (LINZESS) 145 MCG CAPS capsule Take 145 mcg by mouth daily as needed (for constipation).   Marland Kitchen loratadine (CLARITIN) 10 MG tablet Take 10 mg by mouth daily.  . Multiple Vitamins-Minerals (HAIR SKIN AND NAILS FORMULA PO) Take by mouth. 5000 mcg biotin 1 tab per day  . olmesartan-hydrochlorothiazide (BENICAR HCT) 20-12.5 MG per tablet Take 1 tablet by mouth daily.    Marland Kitchen orlistat (ALLI) 60 MG capsule Take 60 mg by mouth 2 (two) times daily.  Marland Kitchen OVER THE COUNTER MEDICATION Take 1 capsule by mouth daily. OTC Abs Cut Belly Fat  . OVER THE COUNTER MEDICATION Take 1 capsule by mouth 2 (two) times daily. Lipozene  . oxyCODONE-acetaminophen (PERCOCET/ROXICET) 5-325 MG tablet Take 1-2 tablets by mouth every 4 (four) hours as needed for severe pain.  . pantoprazole (PROTONIX) 40 MG tablet Take 40 mg by mouth daily. Heartburn  . PROAIR HFA 108 (90 BASE) MCG/ACT inhaler Take 2 puffs by  mouth 4 (four) times daily as needed for wheezing or shortness of breath.   Marland Kitchen UNABLE TO FIND Co q 10 100 mg liquid  q day  . UNABLE TO FIND Focus factor for brain one per day   No facility-administered encounter medications on file as of 04/09/2018.     ALLERGIES:  Allergies  Allergen Reactions  . Codeine Nausea Only  . Sulfur Rash     PHYSICAL EXAM:  ECOG Performance status: 0  Vitals:   04/09/18 1552  BP: 131/66  Pulse: 77  Resp:  18  Temp: 98.5 F (36.9 C)  SpO2: 100%   Filed Weights   04/09/18 1552  Weight: 203 lb 8 oz (92.3 kg)    Physical Exam Deferred.  LABORATORY DATA:  I have reviewed the labs as listed.  CBC    Component Value Date/Time   WBC 4.6 04/03/2018 1420   RBC 4.14 04/03/2018 1420   HGB 12.7 04/03/2018 1420   HCT 40.0 04/03/2018 1420   PLT 269 04/03/2018 1420   MCV 96.6 04/03/2018 1420   MCH 30.7 04/03/2018 1420   MCHC 31.8 04/03/2018 1420   RDW 14.8 04/03/2018 1420   LYMPHSABS 1.2 02/21/2018 1233   MONOABS 0.7 02/21/2018 1233   EOSABS 0.1 02/21/2018 1233   BASOSABS 0.0 02/21/2018 1233   CMP Latest Ref Rng & Units 02/21/2018 08/14/2017 07/10/2011  Glucose 65 - 99 mg/dL 109(H) 97 83  BUN 6 - 20 mg/dL 24(H) 19 19  Creatinine 0.44 - 1.00 mg/dL 1.26(H) 1.00 0.66  Sodium 135 - 145 mmol/L 139 139 138  Potassium 3.5 - 5.1 mmol/L 3.9 3.9 4.1  Chloride 101 - 111 mmol/L 102 104 104  CO2 22 - 32 mmol/L 26 27 29   Calcium 8.9 - 10.3 mg/dL 9.8 9.5 10.0       ASSESSMENT & PLAN:   Normochromic normocytic anemia 1.  Normocytic anemia: - Combination anemia from relative iron deficiency state, chronic kidney disease and poor absorption. -Patient has been on iron pills for the past 2 years without much help. - Nutritional work-up was negative.  SPEP negative.  LDH was normal.  Serum erythropoietin was 50. - Last Feraheme infusion on 02/25/2018 and 03/04/2018, with improvement of hemoglobin from 8.9-12.27.  Ferritin was 71 and percent saturation was  23.  Patient felt improvement in energy levels.  She is exercising every day.  She also reports increase in appetite since she got iron.  She gained about 5 pounds. -I have recommended 2 more infusions of Feraheme 2 weeks apart.  She tolerated without any reactions.  We will see her back in 3 months with a repeat blood work 2 to 3 days prior.        Orders placed this encounter:  No orders of the defined types were placed in this encounter.     Derek Jack, MD [place] [phone]

## 2018-04-15 ENCOUNTER — Encounter (HOSPITAL_COMMUNITY): Payer: Self-pay

## 2018-04-15 ENCOUNTER — Other Ambulatory Visit: Payer: Self-pay

## 2018-04-15 ENCOUNTER — Inpatient Hospital Stay (HOSPITAL_COMMUNITY): Payer: Medicare Other | Attending: Hematology

## 2018-04-15 VITALS — BP 91/53 | HR 69 | Temp 97.8°F | Resp 18

## 2018-04-15 DIAGNOSIS — D631 Anemia in chronic kidney disease: Secondary | ICD-10-CM | POA: Diagnosis not present

## 2018-04-15 DIAGNOSIS — D509 Iron deficiency anemia, unspecified: Secondary | ICD-10-CM | POA: Insufficient documentation

## 2018-04-15 DIAGNOSIS — D649 Anemia, unspecified: Secondary | ICD-10-CM

## 2018-04-15 DIAGNOSIS — I129 Hypertensive chronic kidney disease with stage 1 through stage 4 chronic kidney disease, or unspecified chronic kidney disease: Secondary | ICD-10-CM | POA: Diagnosis not present

## 2018-04-15 DIAGNOSIS — N189 Chronic kidney disease, unspecified: Secondary | ICD-10-CM | POA: Diagnosis not present

## 2018-04-15 MED ORDER — SODIUM CHLORIDE 0.9 % IV SOLN
510.0000 mg | Freq: Once | INTRAVENOUS | Status: AC
  Administered 2018-04-15: 510 mg via INTRAVENOUS
  Filled 2018-04-15: qty 17

## 2018-04-15 MED ORDER — SODIUM CHLORIDE 0.9 % IV SOLN
INTRAVENOUS | Status: AC
  Administered 2018-04-15 – 2019-07-29 (×4): via INTRAVENOUS

## 2018-04-15 NOTE — Progress Notes (Unsigned)
IV feraheme given today. Follow up as scheduled.   Vitals stable and discharged home from clinic ambulatory. Follow up as scheduled.

## 2018-04-15 NOTE — Patient Instructions (Signed)
[facility] at [facility] Discharge Instructions  feraheme given today Follow up as scheduled.   Thank you for choosing [facility] at [facility] to provide your oncology and hematology care.  To afford each patient quality time with our provider, please arrive at least 15 minutes before your scheduled appointment time.   If you have a lab appointment with the Cancer Center please come in thru the  Main Entrance and check in at the main information desk  You need to re-schedule your appointment should you arrive 10 or more minutes late.  We strive to give you quality time with our providers, and arriving late affects you and other patients whose appointments are after yours.  Also, if you no show three or more times for appointments you may be dismissed from the clinic at the providers discretion.     Again, thank you for choosing [facility].  Our hope is that these requests will decrease the amount of time that you wait before being seen by our physicians.       _____________________________________________________________  Should you have questions after your visit to [facility], please contact our office at [phone] between the hours of 8:30 a.m. and 4:30 p.m.  Voicemails left after 4:30 p.m. will not be returned until the following business day.  For prescription refill requests, have your pharmacy contact our office.       Resources For Cancer Patients and their Caregivers ? American Cancer Society: Can assist with transportation, wigs, general needs, runs Look Good Feel Better.        1-888-227-6333 ? Cancer Care: Provides financial assistance, online support groups, medication/co-pay assistance.  1-800-813-HOPE (4673) ? [facility] Assists [place] cancer patients and their families through emotional , educational and financial support.  336-427-4357 ? [place]  DSS Where to apply for food stamps, Medicaid and utility assistance. 336-342-1394 ? [facility]: Transportation to medical appointments. 336-347-2287 ? Social Security Administration: May apply for disability if have a Stage IV cancer. 336-342-7796 1-800-772-1213 ? [place] Aging, Disability and Transit Services: Assists with nutrition, care and transit needs. 336-349-2343  Cancer Center Support Programs:   > Cancer Support Group  2nd Tuesday of the month 1pm-2pm, Journey Room   > Creative Journey  3rd Tuesday of the month 1130am-1pm, Journey Room    

## 2018-04-29 ENCOUNTER — Encounter (HOSPITAL_COMMUNITY): Payer: Self-pay

## 2018-04-29 ENCOUNTER — Inpatient Hospital Stay (HOSPITAL_COMMUNITY): Payer: Medicare Other

## 2018-04-29 ENCOUNTER — Other Ambulatory Visit: Payer: Self-pay

## 2018-04-29 VITALS — BP 98/54 | HR 74 | Temp 98.6°F | Resp 16

## 2018-04-29 DIAGNOSIS — N189 Chronic kidney disease, unspecified: Secondary | ICD-10-CM | POA: Diagnosis not present

## 2018-04-29 DIAGNOSIS — D509 Iron deficiency anemia, unspecified: Secondary | ICD-10-CM | POA: Diagnosis not present

## 2018-04-29 DIAGNOSIS — D649 Anemia, unspecified: Secondary | ICD-10-CM

## 2018-04-29 DIAGNOSIS — D631 Anemia in chronic kidney disease: Secondary | ICD-10-CM | POA: Diagnosis not present

## 2018-04-29 DIAGNOSIS — I129 Hypertensive chronic kidney disease with stage 1 through stage 4 chronic kidney disease, or unspecified chronic kidney disease: Secondary | ICD-10-CM | POA: Diagnosis not present

## 2018-04-29 MED ORDER — SODIUM CHLORIDE 0.9 % IV SOLN
510.0000 mg | Freq: Once | INTRAVENOUS | Status: AC
  Administered 2018-04-29: 510 mg via INTRAVENOUS
  Filled 2018-04-29: qty 17

## 2018-04-29 MED ORDER — SODIUM CHLORIDE 0.9 % IV SOLN
INTRAVENOUS | Status: DC
  Administered 2018-04-29: 14:00:00 via INTRAVENOUS

## 2018-04-29 NOTE — Patient Instructions (Signed)
[facility] at [facility] Discharge Instructions  Feraheme given today. Follow up as scheduled.   Thank you for choosing  [facility] at [facility] to provide your oncology and hematology care.  To afford each patient quality time with our provider, please arrive at least 15 minutes before your scheduled appointment time.   If you have a lab appointment with the Cancer Center please come in thru the  Main Entrance and check in at the main information desk  You need to re-schedule your appointment should you arrive 10 or more minutes late.  We strive to give you quality time with our providers, and arriving late affects you and other patients whose appointments are after yours.  Also, if you no show three or more times for appointments you may be dismissed from the clinic at the providers discretion.     Again, thank you for choosing [facility].  Our hope is that these requests will decrease the amount of time that you wait before being seen by our physicians.       _____________________________________________________________  Should you have questions after your visit to [facility], please contact our office at [phone] between the hours of 8:30 a.m. and 4:30 p.m.  Voicemails left after 4:30 p.m. will not be returned until the following business day.  For prescription refill requests, have your pharmacy contact our office.       Resources For Cancer Patients and their Caregivers ? American Cancer Society: Can assist with transportation, wigs, general needs, runs Look Good Feel Better.        1-888-227-6333 ? Cancer Care: Provides financial assistance, online support groups, medication/co-pay assistance.  1-800-813-HOPE (4673) ? [facility] Assists [place] cancer patients and their families through emotional , educational and financial support.  336-427-4357 ? [place]  DSS Where to apply for food stamps, Medicaid and utility assistance. 336-342-1394 ? RCATS: Transportation to medical appointments. 336-347-2287 ? Social Security Administration: May apply for disability if have a Stage IV cancer. 336-342-7796 1-800-772-1213 ? [place] Aging, Disability and Transit Services: Assists with nutrition, care and transit needs. 336-349-2343  Cancer Center Support Programs:   > Cancer Support Group  2nd Tuesday of the month 1pm-2pm, Journey Room   > Creative Journey  3rd Tuesday of the month 1130am-1pm, Journey Room    

## 2018-04-29 NOTE — Progress Notes (Signed)
Feraheme given today per orders. Patient tolerated it well without problems. Vitals stable and discharged home from clinic ambulatory. Follow up as scheduled.  

## 2018-04-30 DIAGNOSIS — M1711 Unilateral primary osteoarthritis, right knee: Secondary | ICD-10-CM | POA: Diagnosis not present

## 2018-04-30 DIAGNOSIS — M25561 Pain in right knee: Secondary | ICD-10-CM | POA: Diagnosis not present

## 2018-05-07 DIAGNOSIS — M25561 Pain in right knee: Secondary | ICD-10-CM | POA: Diagnosis not present

## 2018-05-07 DIAGNOSIS — M1711 Unilateral primary osteoarthritis, right knee: Secondary | ICD-10-CM | POA: Diagnosis not present

## 2018-05-08 DIAGNOSIS — M25561 Pain in right knee: Secondary | ICD-10-CM | POA: Diagnosis not present

## 2018-05-14 DIAGNOSIS — M25561 Pain in right knee: Secondary | ICD-10-CM | POA: Diagnosis not present

## 2018-05-14 DIAGNOSIS — M1711 Unilateral primary osteoarthritis, right knee: Secondary | ICD-10-CM | POA: Diagnosis not present

## 2018-05-22 DIAGNOSIS — M25561 Pain in right knee: Secondary | ICD-10-CM | POA: Diagnosis not present

## 2018-05-22 DIAGNOSIS — M1711 Unilateral primary osteoarthritis, right knee: Secondary | ICD-10-CM | POA: Diagnosis not present

## 2018-05-28 DIAGNOSIS — M1711 Unilateral primary osteoarthritis, right knee: Secondary | ICD-10-CM | POA: Diagnosis not present

## 2018-05-28 DIAGNOSIS — M25561 Pain in right knee: Secondary | ICD-10-CM | POA: Diagnosis not present

## 2018-07-02 ENCOUNTER — Other Ambulatory Visit (HOSPITAL_COMMUNITY): Payer: Self-pay

## 2018-07-02 DIAGNOSIS — D649 Anemia, unspecified: Secondary | ICD-10-CM

## 2018-07-03 ENCOUNTER — Other Ambulatory Visit (HOSPITAL_COMMUNITY)
Admission: RE | Admit: 2018-07-03 | Discharge: 2018-07-03 | Disposition: A | Discharge status: Home / Self Care | Payer: Medicare Other | Source: Ambulatory Visit | Attending: Hematology | Admitting: Hematology

## 2018-07-03 ENCOUNTER — Inpatient Hospital Stay (HOSPITAL_COMMUNITY): Payer: Medicare Other | Attending: Hematology

## 2018-07-03 DIAGNOSIS — K5730 Diverticulosis of large intestine without perforation or abscess without bleeding: Secondary | ICD-10-CM | POA: Insufficient documentation

## 2018-07-03 DIAGNOSIS — I129 Hypertensive chronic kidney disease with stage 1 through stage 4 chronic kidney disease, or unspecified chronic kidney disease: Secondary | ICD-10-CM | POA: Diagnosis not present

## 2018-07-03 DIAGNOSIS — N189 Chronic kidney disease, unspecified: Secondary | ICD-10-CM | POA: Insufficient documentation

## 2018-07-03 DIAGNOSIS — R5383 Other fatigue: Secondary | ICD-10-CM | POA: Insufficient documentation

## 2018-07-03 DIAGNOSIS — Z87891 Personal history of nicotine dependence: Secondary | ICD-10-CM | POA: Diagnosis not present

## 2018-07-03 DIAGNOSIS — K644 Residual hemorrhoidal skin tags: Secondary | ICD-10-CM | POA: Diagnosis not present

## 2018-07-03 DIAGNOSIS — D649 Anemia, unspecified: Secondary | ICD-10-CM | POA: Insufficient documentation

## 2018-07-03 DIAGNOSIS — Z79899 Other long term (current) drug therapy: Secondary | ICD-10-CM | POA: Diagnosis not present

## 2018-07-03 DIAGNOSIS — D631 Anemia in chronic kidney disease: Secondary | ICD-10-CM | POA: Diagnosis not present

## 2018-07-03 DIAGNOSIS — K219 Gastro-esophageal reflux disease without esophagitis: Secondary | ICD-10-CM | POA: Diagnosis not present

## 2018-07-03 DIAGNOSIS — F418 Other specified anxiety disorders: Secondary | ICD-10-CM | POA: Insufficient documentation

## 2018-07-03 LAB — CBC WITH DIFFERENTIAL/PLATELET
Basophils Absolute: 0 10*3/uL (ref 0.0–0.1)
Basophils Relative: 1 %
Eosinophils Absolute: 0.4 10*3/uL (ref 0.0–0.7)
Eosinophils Relative: 6 %
HCT: 35.5 % — ABNORMAL LOW (ref 36.0–46.0)
Hemoglobin: 11.6 g/dL — ABNORMAL LOW (ref 12.0–15.0)
Lymphocytes Relative: 31 %
Lymphs Abs: 1.7 10*3/uL (ref 0.7–4.0)
MCH: 31.4 pg (ref 26.0–34.0)
MCHC: 32.7 g/dL (ref 30.0–36.0)
MCV: 96.2 fL (ref 78.0–100.0)
Monocytes Absolute: 0.5 10*3/uL (ref 0.1–1.0)
Monocytes Relative: 8 %
Neutro Abs: 3 10*3/uL (ref 1.7–7.7)
Neutrophils Relative %: 54 %
Platelets: 289 10*3/uL (ref 150–400)
RBC: 3.69 MIL/uL — ABNORMAL LOW (ref 3.87–5.11)
RDW: 15.2 % (ref 11.5–15.5)
WBC: 5.6 10*3/uL (ref 4.0–10.5)

## 2018-07-03 LAB — IRON AND TIBC
Iron: 93 ug/dL (ref 28–170)
Saturation Ratios: 24 % (ref 10.4–31.8)
TIBC: 381 ug/dL (ref 250–450)
UIBC: 288 ug/dL

## 2018-07-03 LAB — FERRITIN: Ferritin: 40 ng/mL (ref 11–307)

## 2018-07-10 ENCOUNTER — Other Ambulatory Visit: Payer: Self-pay

## 2018-07-10 ENCOUNTER — Encounter (HOSPITAL_COMMUNITY): Payer: Self-pay | Admitting: Hematology

## 2018-07-10 ENCOUNTER — Inpatient Hospital Stay (HOSPITAL_BASED_OUTPATIENT_CLINIC_OR_DEPARTMENT_OTHER): Payer: Medicare Other | Admitting: Hematology

## 2018-07-10 VITALS — BP 106/57 | HR 65 | Temp 98.0°F | Resp 18 | Wt 196.1 lb

## 2018-07-10 DIAGNOSIS — K219 Gastro-esophageal reflux disease without esophagitis: Secondary | ICD-10-CM | POA: Diagnosis not present

## 2018-07-10 DIAGNOSIS — I129 Hypertensive chronic kidney disease with stage 1 through stage 4 chronic kidney disease, or unspecified chronic kidney disease: Secondary | ICD-10-CM

## 2018-07-10 DIAGNOSIS — N189 Chronic kidney disease, unspecified: Secondary | ICD-10-CM

## 2018-07-10 DIAGNOSIS — K644 Residual hemorrhoidal skin tags: Secondary | ICD-10-CM | POA: Diagnosis not present

## 2018-07-10 DIAGNOSIS — K5730 Diverticulosis of large intestine without perforation or abscess without bleeding: Secondary | ICD-10-CM

## 2018-07-10 DIAGNOSIS — Z87891 Personal history of nicotine dependence: Secondary | ICD-10-CM

## 2018-07-10 DIAGNOSIS — R5383 Other fatigue: Secondary | ICD-10-CM | POA: Diagnosis not present

## 2018-07-10 DIAGNOSIS — D631 Anemia in chronic kidney disease: Secondary | ICD-10-CM

## 2018-07-10 DIAGNOSIS — Z79899 Other long term (current) drug therapy: Secondary | ICD-10-CM

## 2018-07-10 DIAGNOSIS — D649 Anemia, unspecified: Secondary | ICD-10-CM

## 2018-07-10 DIAGNOSIS — F418 Other specified anxiety disorders: Secondary | ICD-10-CM | POA: Diagnosis not present

## 2018-07-10 DIAGNOSIS — D508 Other iron deficiency anemias: Secondary | ICD-10-CM

## 2018-07-10 NOTE — Patient Instructions (Signed)
[facility] at [facility] Discharge Instructions  Follow u pin 4 months with labs prior    Thank you for choosing [facility] at [facility] to provide your oncology and hematology care.  To afford each patient quality time with our provider, please arrive at least 15 minutes before your scheduled appointment time.   If you have a lab appointment with the Cancer Center please come in thru the  Main Entrance and check in at the main information desk  You need to re-schedule your appointment should you arrive 10 or more minutes late.  We strive to give you quality time with our providers, and arriving late affects you and other patients whose appointments are after yours.  Also, if you no show three or more times for appointments you may be dismissed from the clinic at the providers discretion.     Again, thank you for choosing [facility].  Our hope is that these requests will decrease the amount of time that you wait before being seen by our physicians.       _____________________________________________________________  Should you have questions after your visit to [facility], please contact our office at [phone] between the hours of 8:00 a.m. and 4:30 p.m.  Voicemails left after 4:00 p.m. will not be returned until the following business day.  For prescription refill requests, have your pharmacy contact our office and allow 72 hours.    Cancer Center Support Programs:   > Cancer Support Group  2nd Tuesday of the month 1pm-2pm, Journey Room    

## 2018-07-10 NOTE — Progress Notes (Signed)
Candice [name], [place] [id number]   CLINIC:  Medical Oncology/Hematology  PCP:  Candice Hilding, MD [place] [address] [phone]   REASON FOR VISIT:  Follow-up for normocytic anemia  CURRENT THERAPY: Intermittent iron infusions   INTERVAL HISTORY:  Candice [name] 67 y.o. female returns for routine follow-up for normocytic anemia. Patient is here today and she is still experiencing fatigue. She reports her energy level at 75%. Her appetite is 100% and she is maintaining her weight. She lives at home and performs all her own ADLs and activities. She is going to the gym 3-4 days a week. Patient denies lightheadedness or headaches. Denies any nausea, vomiting, or diarrhea. Denies any new pains. Denies any bleeding or blood in her stool.    REVIEW OF SYSTEMS:  Review of Systems  All other systems reviewed and are negative.    PAST MEDICAL/SURGICAL HISTORY:  Past Medical History:  Diagnosis Date  . Anxiety   . Asthma   . Chronic constipation   . Diverticulosis of colon   . Dyspnea    sob with exertion  . GERD (gastroesophageal reflux disease)   . Hemorrhoids   . Herpes   . History of lumpectomy of right breast   . Hypertension   . Major depression   . Rectocele    W/ POSSIBLE CYSTOCELE   Past Surgical History:  Procedure Laterality Date  . BREAST SURGERY Right 01/17/2007   dr Candice [name]   excisional breast mass (papilloma)  . COLONOSCOPY  10/30/2012   Procedure: COLONOSCOPY;  Surgeon: Candice Houston, MD;  [facility];  Service: Endoscopy;  Laterality: N/A;  200  . COLONOSCOPY N/A 07/24/2017   Procedure: COLONOSCOPY;  Surgeon: Candice Houston, MD;  [facility];  Service: Endoscopy;  Laterality: N/A;  1230  . ESOPHAGOGASTRODUODENOSCOPY  10/30/2012   Procedure: ESOPHAGOGASTRODUODENOSCOPY (EGD);  Surgeon: Candice Houston, MD;  [facility];  Service: Endoscopy;  Laterality: N/A;  . [place]  . NEEDLE-GUIDED EXCISION RIGHT BREAST MASS  07-13-2011  dr Candice [name]  . RECTOCELE REPAIR N/A 08/14/2017   Procedure: POSTERIOR REPAIR (RECTOCELE) WITH ENTEROCELE REPAIR;  Surgeon: Candice Fowler, MD;  Location: [facility];  Service: Gynecology;  Laterality: N/A;  Outpatient extended recovery  . VAGINAL HYSTERECTOMY     complete     SOCIAL HISTORY:  Social History   Socioeconomic History  . Marital status: Divorced    Spouse name: Not on file  . Number of children: Not on file  . Years of education: Not on file  . Highest education level: Not on file  Occupational History  . Not on file  Social Needs  . Financial resource strain: Not on file  . Food insecurity:    Worry: Not on file    Inability: Not on file  . Transportation needs:    Medical: Not on file    Non-medical: Not on file  Tobacco Use  . Smoking status: Former Research scientist (life sciences)  . Smokeless tobacco: Never Used  . Tobacco comment: years ago  Substance and Sexual Activity  . Alcohol use: Yes    Comment: occ wine  . Drug use: No  . Sexual activity: Not on file  Lifestyle  . Physical activity:    Days per week: Not on file    Minutes per session: Not on file  . Stress: Not on file  Relationships  .  Social connections:    Talks on phone: Not on file    Gets together: Not on file    Attends religious service: Not on file    Active member of club or organization: Not on file    Attends meetings of clubs or organizations: Not on file    Relationship status: Not on file  . Intimate partner violence:    Fear of current or ex partner: Not on file    Emotionally abused: Not on file    Physically abused: Not on file    Forced sexual activity: Not on file  Other Topics Concern  . Not on file  Social History Narrative  . Not on file    FAMILY HISTORY:  History reviewed. No pertinent family history.  CURRENT MEDICATIONS:  Outpatient Encounter Medications as of 07/10/2018  Medication Sig  .  acyclovir (ZOVIRAX) 400 MG tablet Take 400 mg by mouth daily as needed.   . Cholecalciferol (VITAMIN D) 2000 units CAPS Take 2,000 Units by mouth daily. Vit d 3  . citalopram (CELEXA) 20 MG tablet Take 20 mg by mouth every morning.   . clonazePAM (KLONOPIN) 0.5 MG tablet Take 0.25 mg by mouth at bedtime.  . diclofenac (VOLTAREN) 75 MG EC tablet Take 75 mg by mouth 2 (two) times daily.  . fish oil-omega-3 fatty acids 1000 MG capsule Take 1 g by mouth daily.   . Fluticasone-Salmeterol (ADVAIR) 250-50 MCG/DOSE AEPB Inhale 1 puff into the lungs daily as needed (for shortness of breath).   Marland Kitchen ibuprofen (ADVIL,MOTRIN) 600 MG tablet Take 1 tablet (600 mg total) by mouth every 6 (six) hours as needed (mild pain).  Marland Kitchen linaclotide (LINZESS) 145 MCG CAPS capsule Take 145 mcg by mouth daily as needed (for constipation).   Marland Kitchen loratadine (CLARITIN) 10 MG tablet Take 10 mg by mouth daily.  . Multiple Vitamins-Minerals (HAIR SKIN AND NAILS FORMULA PO) Take by mouth. 5000 mcg biotin 1 tab per day  . olmesartan-hydrochlorothiazide (BENICAR HCT) 20-12.5 MG per tablet Take 1 tablet by mouth daily.    Marland Kitchen orlistat (ALLI) 60 MG capsule Take 60 mg by mouth 2 (two) times daily.  Marland Kitchen OVER THE COUNTER MEDICATION Take 1 capsule by mouth daily. OTC Abs Cut Belly Fat  . OVER THE COUNTER MEDICATION Take 1 capsule by mouth 2 (two) times daily. Lipozene  . oxyCODONE-acetaminophen (PERCOCET/ROXICET) 5-325 MG tablet Take 1-2 tablets by mouth every 4 (four) hours as needed for severe pain.  . pantoprazole (PROTONIX) 40 MG tablet Take 40 mg by mouth daily. Heartburn  . PROAIR HFA 108 (90 BASE) MCG/ACT inhaler Take 2 puffs by mouth 4 (four) times daily as needed for wheezing or shortness of breath.   Marland Kitchen UNABLE TO FIND Co q 10 100 mg liquid  q day  . UNABLE TO FIND Focus factor for brain one per day   Facility-Administered Encounter Medications as of 07/10/2018  Medication  . 0.9 %  sodium chloride infusion    ALLERGIES:  Allergies    Allergen Reactions  . Codeine Nausea Only  . Sulfur Rash     PHYSICAL EXAM:  ECOG Performance status: 1  Vitals:   07/10/18 1442 07/10/18 1451  BP: (!) 106/57 (!) 106/57  Pulse: 65 65  Resp: 18 18  Temp: 98 F (36.7 C) 98 F (36.7 C)  SpO2: 98% 98%   Filed Weights   07/10/18 1442 07/10/18 1451  Weight: 196 lb 6.4 oz (89.1 kg) 196 lb 1.6 oz (89 kg)  Physical Exam  Constitutional: She is oriented to person, place, and time. She appears well-developed and well-nourished.  Cardiovascular: Normal rate, regular rhythm and normal heart sounds.  Pulmonary/Chest: Effort normal and breath sounds normal.  Neurological: She is alert and oriented to person, place, and time.  Skin: Skin is warm and dry.     LABORATORY DATA:  I have reviewed the labs as listed.  CBC    Component Value Date/Time   WBC 5.6 07/03/2018 1228   RBC 3.69 (L) 07/03/2018 1228   HGB 11.6 (L) 07/03/2018 1228   HCT 35.5 (L) 07/03/2018 1228   PLT 289 07/03/2018 1228   MCV 96.2 07/03/2018 1228   MCH 31.4 07/03/2018 1228   MCHC 32.7 07/03/2018 1228   RDW 15.2 07/03/2018 1228   LYMPHSABS 1.7 07/03/2018 1228   MONOABS 0.5 07/03/2018 1228   EOSABS 0.4 07/03/2018 1228   BASOSABS 0.0 07/03/2018 1228   CMP Latest Ref Rng & Units 02/21/2018 08/14/2017 07/10/2011  Glucose 65 - 99 mg/dL 109(H) 97 83  BUN 6 - 20 mg/dL 24(H) 19 19  Creatinine 0.44 - 1.00 mg/dL 1.26(H) 1.00 0.66  Sodium 135 - 145 mmol/L 139 139 138  Potassium 3.5 - 5.1 mmol/L 3.9 3.9 4.1  Chloride 101 - 111 mmol/L 102 104 104  CO2 22 - 32 mmol/L 26 27 29   Calcium 8.9 - 10.3 mg/dL 9.8 9.5 10.0          ASSESSMENT & PLAN:   Iron deficiency anemia 1.  Normocytic anemia: - Combination anemia from relative iron deficiency state, chronic kidney disease and poor absorption. -Patient has been on iron pills for the past 2 years without much help. - Nutritional work-up was negative.  SPEP negative.  LDH was normal.  Serum erythropoietin was  50. - Colonoscopy on 07/24/2017 shows diverticulosis of the sigmoid colon, external hemorrhoids. -Feraheme infusion on 02/25/2018 and 03/04/2018, with improvement of hemoglobin from 8.9-12.27.  She received 2 more infusions on 04/15/2018 and 04/29/2018.  Energy levels are stable and improved.   -We have reviewed her current blood work from last week.  Hemoglobin has come down to 11.6 from 12.7 g.  Ferritin has decreased to 40 from 71.  Creatinine was 1.26.  Percent saturation was stable at 24. -I have recommended 2 more infusions of Feraheme 1 week apart.  I will reevaluate her in 4 months with repeat blood work.         Orders placed this encounter:  Orders Placed This Encounter  Procedures  . CBC with Differential/Platelet  . Comprehensive metabolic panel  . Ferritin  . Iron and TIBC  . Folate  . Vitamin B12      Derek Jack, [place] [phone]

## 2018-07-10 NOTE — Assessment & Plan Note (Signed)
1.  Normocytic anemia: - Combination anemia from relative iron deficiency state, chronic kidney disease and poor absorption. -Patient has been on iron pills for the past 2 years without much help. - Nutritional work-up was negative.  SPEP negative.  LDH was normal.  Serum erythropoietin was 50. - Colonoscopy on 07/24/2017 shows diverticulosis of the sigmoid colon, external hemorrhoids. -Feraheme infusion on 02/25/2018 and 03/04/2018, with improvement of hemoglobin from 8.9-12.27.  She received 2 more infusions on 04/15/2018 and 04/29/2018.  Energy levels are stable and improved.   -We have reviewed her current blood work from last week.  Hemoglobin has come down to 11.6 from 12.7 g.  Ferritin has decreased to 40 from 71.  Creatinine was 1.26.  Percent saturation was stable at 24. -I have recommended 2 more infusions of Feraheme 1 week apart.  I will reevaluate her in 4 months with repeat blood work.

## 2018-07-19 ENCOUNTER — Other Ambulatory Visit (HOSPITAL_COMMUNITY): Payer: Self-pay | Admitting: Hematology

## 2018-07-22 ENCOUNTER — Inpatient Hospital Stay (HOSPITAL_COMMUNITY): Payer: Medicare Other | Attending: Hematology

## 2018-07-22 ENCOUNTER — Other Ambulatory Visit: Payer: Self-pay

## 2018-07-22 ENCOUNTER — Encounter (HOSPITAL_COMMUNITY): Payer: Self-pay

## 2018-07-22 VITALS — BP 132/61 | HR 67 | Temp 97.6°F | Resp 18

## 2018-07-22 DIAGNOSIS — D631 Anemia in chronic kidney disease: Secondary | ICD-10-CM | POA: Insufficient documentation

## 2018-07-22 DIAGNOSIS — I129 Hypertensive chronic kidney disease with stage 1 through stage 4 chronic kidney disease, or unspecified chronic kidney disease: Secondary | ICD-10-CM | POA: Insufficient documentation

## 2018-07-22 DIAGNOSIS — N189 Chronic kidney disease, unspecified: Secondary | ICD-10-CM | POA: Diagnosis not present

## 2018-07-22 DIAGNOSIS — D509 Iron deficiency anemia, unspecified: Secondary | ICD-10-CM | POA: Insufficient documentation

## 2018-07-22 DIAGNOSIS — D649 Anemia, unspecified: Secondary | ICD-10-CM

## 2018-07-22 MED ORDER — SODIUM CHLORIDE 0.9 % IV SOLN
510.0000 mg | Freq: Once | INTRAVENOUS | Status: AC
  Administered 2018-07-22: 510 mg via INTRAVENOUS
  Filled 2018-07-22: qty 17

## 2018-07-22 MED ORDER — SODIUM CHLORIDE 0.9 % IV SOLN
Freq: Once | INTRAVENOUS | Status: AC
  Administered 2018-07-22: 15:00:00 via INTRAVENOUS

## 2018-07-22 NOTE — Progress Notes (Signed)
Tolerated infusion w/o adverse reaction.  Alert, in no distress.  VSS.  Discharged ambulatory.  

## 2018-07-29 ENCOUNTER — Inpatient Hospital Stay (HOSPITAL_COMMUNITY): Payer: Medicare Other

## 2018-07-29 ENCOUNTER — Encounter (HOSPITAL_COMMUNITY): Payer: Self-pay

## 2018-07-29 VITALS — BP 112/60 | HR 72 | Temp 98.2°F | Resp 18

## 2018-07-29 DIAGNOSIS — N189 Chronic kidney disease, unspecified: Secondary | ICD-10-CM | POA: Diagnosis not present

## 2018-07-29 DIAGNOSIS — I129 Hypertensive chronic kidney disease with stage 1 through stage 4 chronic kidney disease, or unspecified chronic kidney disease: Secondary | ICD-10-CM | POA: Diagnosis not present

## 2018-07-29 DIAGNOSIS — D509 Iron deficiency anemia, unspecified: Secondary | ICD-10-CM | POA: Diagnosis not present

## 2018-07-29 DIAGNOSIS — D649 Anemia, unspecified: Secondary | ICD-10-CM

## 2018-07-29 DIAGNOSIS — D631 Anemia in chronic kidney disease: Secondary | ICD-10-CM | POA: Diagnosis not present

## 2018-07-29 MED ORDER — SODIUM CHLORIDE 0.9% FLUSH: 10.0000 mL | Freq: Once | INTRAVENOUS | Status: DC | PRN

## 2018-07-29 MED ORDER — SODIUM CHLORIDE 0.9 % IV SOLN
510.0000 mg | Freq: Once | INTRAVENOUS | Status: AC
  Administered 2018-07-29: 510 mg via INTRAVENOUS
  Filled 2018-07-29: qty 17

## 2018-07-29 MED ORDER — SODIUM CHLORIDE 0.9 % IV SOLN
Freq: Once | INTRAVENOUS | Status: AC
  Administered 2018-07-29: 11:00:00 via INTRAVENOUS

## 2018-07-29 NOTE — Progress Notes (Signed)
Patient tolerated iron infusion with no complaints voiced.  Good blood return noted before and after administration of therapy.  Peripheral IV site clean and dry with no bruising or swelling noted at site.  Band aid applied.  VSS with discharge and left ambulatory with no s/s of distress noted.

## 2018-07-29 NOTE — Patient Instructions (Signed)
[facility] at [facility]  Discharge Instructions:  You received an iron infusion today.  _______________________________________________________________  Thank you for choosing [facility] at [facility] to provide your oncology and hematology care.  To afford each patient quality time with our providers, please arrive at least 15 minutes before your scheduled appointment.  You need to re-schedule your appointment if you arrive 10 or more minutes late.  We strive to give you quality time with our providers, and arriving late affects you and other patients whose appointments are after yours.  Also, if you no show three or more times for appointments you may be dismissed from the clinic.  Again, thank you for choosing [facility] at [facility]. Our hope is that these requests will allow you access to exceptional care and in a timely manner. _______________________________________________________________  If you have questions after your visit, please contact our office at [phone] between the hours of 8:30 a.m. and 5:00 p.m. Voicemails left after 4:30 p.m. will not be returned until the following business day. _______________________________________________________________  For prescription refill requests, have your pharmacy contact our office. _______________________________________________________________  Recommendations made by the consultant and any test results will be sent to your referring physician. _______________________________________________________________ 

## 2018-08-07 DIAGNOSIS — Z23 Encounter for immunization: Secondary | ICD-10-CM | POA: Diagnosis not present

## 2018-08-07 DIAGNOSIS — G252 Other specified forms of tremor: Secondary | ICD-10-CM | POA: Diagnosis not present

## 2018-08-07 DIAGNOSIS — R739 Hyperglycemia, unspecified: Secondary | ICD-10-CM | POA: Diagnosis not present

## 2018-08-07 DIAGNOSIS — Z1331 Encounter for screening for depression: Secondary | ICD-10-CM | POA: Diagnosis not present

## 2018-08-07 DIAGNOSIS — I10 Essential (primary) hypertension: Secondary | ICD-10-CM | POA: Diagnosis not present

## 2018-08-07 DIAGNOSIS — F411 Generalized anxiety disorder: Secondary | ICD-10-CM | POA: Diagnosis not present

## 2018-08-07 DIAGNOSIS — J45909 Unspecified asthma, uncomplicated: Secondary | ICD-10-CM | POA: Diagnosis not present

## 2018-08-07 DIAGNOSIS — Z6835 Body mass index (BMI) 35.0-35.9, adult: Secondary | ICD-10-CM | POA: Diagnosis not present

## 2018-08-07 DIAGNOSIS — K210 Gastro-esophageal reflux disease with esophagitis: Secondary | ICD-10-CM | POA: Diagnosis not present

## 2018-08-07 DIAGNOSIS — Z6832 Body mass index (BMI) 32.0-32.9, adult: Secondary | ICD-10-CM | POA: Diagnosis not present

## 2018-08-07 DIAGNOSIS — D508 Other iron deficiency anemias: Secondary | ICD-10-CM | POA: Diagnosis not present

## 2018-08-07 DIAGNOSIS — Z1389 Encounter for screening for other disorder: Secondary | ICD-10-CM | POA: Diagnosis not present

## 2018-08-07 DIAGNOSIS — E782 Mixed hyperlipidemia: Secondary | ICD-10-CM | POA: Diagnosis not present

## 2018-08-07 DIAGNOSIS — F331 Major depressive disorder, recurrent, moderate: Secondary | ICD-10-CM | POA: Diagnosis not present

## 2018-08-28 DIAGNOSIS — M25461 Effusion, right knee: Secondary | ICD-10-CM | POA: Diagnosis not present

## 2018-08-28 DIAGNOSIS — M25561 Pain in right knee: Secondary | ICD-10-CM | POA: Diagnosis not present

## 2018-08-28 DIAGNOSIS — M1711 Unilateral primary osteoarthritis, right knee: Secondary | ICD-10-CM | POA: Diagnosis not present

## 2018-09-04 DIAGNOSIS — M25561 Pain in right knee: Secondary | ICD-10-CM | POA: Diagnosis not present

## 2018-09-04 DIAGNOSIS — M1711 Unilateral primary osteoarthritis, right knee: Secondary | ICD-10-CM | POA: Diagnosis not present

## 2018-09-11 DIAGNOSIS — M25561 Pain in right knee: Secondary | ICD-10-CM | POA: Diagnosis not present

## 2018-09-11 DIAGNOSIS — M1711 Unilateral primary osteoarthritis, right knee: Secondary | ICD-10-CM | POA: Diagnosis not present

## 2018-09-16 DIAGNOSIS — M79672 Pain in left foot: Secondary | ICD-10-CM | POA: Diagnosis not present

## 2018-09-16 DIAGNOSIS — M722 Plantar fascial fibromatosis: Secondary | ICD-10-CM | POA: Diagnosis not present

## 2018-09-16 DIAGNOSIS — M79671 Pain in right foot: Secondary | ICD-10-CM | POA: Diagnosis not present

## 2018-09-16 DIAGNOSIS — M25579 Pain in unspecified ankle and joints of unspecified foot: Secondary | ICD-10-CM | POA: Diagnosis not present

## 2018-09-19 DIAGNOSIS — M1711 Unilateral primary osteoarthritis, right knee: Secondary | ICD-10-CM | POA: Diagnosis not present

## 2018-09-19 DIAGNOSIS — M25561 Pain in right knee: Secondary | ICD-10-CM | POA: Diagnosis not present

## 2018-09-25 DIAGNOSIS — M25561 Pain in right knee: Secondary | ICD-10-CM | POA: Diagnosis not present

## 2018-09-25 DIAGNOSIS — M1711 Unilateral primary osteoarthritis, right knee: Secondary | ICD-10-CM | POA: Diagnosis not present

## 2018-09-30 ENCOUNTER — Other Ambulatory Visit: Payer: Self-pay

## 2018-10-07 DIAGNOSIS — M722 Plantar fascial fibromatosis: Secondary | ICD-10-CM | POA: Diagnosis not present

## 2018-10-07 DIAGNOSIS — M79671 Pain in right foot: Secondary | ICD-10-CM | POA: Diagnosis not present

## 2018-10-07 DIAGNOSIS — M25579 Pain in unspecified ankle and joints of unspecified foot: Secondary | ICD-10-CM | POA: Diagnosis not present

## 2018-10-07 DIAGNOSIS — M79672 Pain in left foot: Secondary | ICD-10-CM | POA: Diagnosis not present

## 2018-10-14 DIAGNOSIS — M722 Plantar fascial fibromatosis: Secondary | ICD-10-CM | POA: Diagnosis not present

## 2018-10-14 DIAGNOSIS — M25579 Pain in unspecified ankle and joints of unspecified foot: Secondary | ICD-10-CM | POA: Diagnosis not present

## 2018-10-14 DIAGNOSIS — M79671 Pain in right foot: Secondary | ICD-10-CM | POA: Diagnosis not present

## 2018-10-14 DIAGNOSIS — M79672 Pain in left foot: Secondary | ICD-10-CM | POA: Diagnosis not present

## 2018-10-17 DIAGNOSIS — H8110 Benign paroxysmal vertigo, unspecified ear: Secondary | ICD-10-CM | POA: Diagnosis not present

## 2018-10-17 DIAGNOSIS — Z6832 Body mass index (BMI) 32.0-32.9, adult: Secondary | ICD-10-CM | POA: Diagnosis not present

## 2018-11-04 ENCOUNTER — Inpatient Hospital Stay (HOSPITAL_COMMUNITY): Payer: Medicare Other | Attending: Hematology

## 2018-11-04 DIAGNOSIS — I10 Essential (primary) hypertension: Secondary | ICD-10-CM | POA: Insufficient documentation

## 2018-11-04 DIAGNOSIS — N189 Chronic kidney disease, unspecified: Secondary | ICD-10-CM | POA: Insufficient documentation

## 2018-11-04 DIAGNOSIS — D649 Anemia, unspecified: Secondary | ICD-10-CM

## 2018-11-04 DIAGNOSIS — Z79899 Other long term (current) drug therapy: Secondary | ICD-10-CM | POA: Diagnosis not present

## 2018-11-04 DIAGNOSIS — Z87891 Personal history of nicotine dependence: Secondary | ICD-10-CM | POA: Diagnosis not present

## 2018-11-04 DIAGNOSIS — D509 Iron deficiency anemia, unspecified: Secondary | ICD-10-CM | POA: Insufficient documentation

## 2018-11-04 DIAGNOSIS — D631 Anemia in chronic kidney disease: Secondary | ICD-10-CM | POA: Insufficient documentation

## 2018-11-04 LAB — CBC WITH DIFFERENTIAL/PLATELET
Abs Immature Granulocytes: 0.01 10*3/uL (ref 0.00–0.07)
Basophils Absolute: 0.1 10*3/uL (ref 0.0–0.1)
Basophils Relative: 1 %
Eosinophils Absolute: 0.4 10*3/uL (ref 0.0–0.5)
Eosinophils Relative: 7 %
HCT: 35.4 % — ABNORMAL LOW (ref 36.0–46.0)
Hemoglobin: 11.3 g/dL — ABNORMAL LOW (ref 12.0–15.0)
Immature Granulocytes: 0 %
Lymphocytes Relative: 24 %
Lymphs Abs: 1.3 10*3/uL (ref 0.7–4.0)
MCH: 30.2 pg (ref 26.0–34.0)
MCHC: 31.9 g/dL (ref 30.0–36.0)
MCV: 94.7 fL (ref 80.0–100.0)
Monocytes Absolute: 0.6 10*3/uL (ref 0.1–1.0)
Monocytes Relative: 10 %
Neutro Abs: 3.1 10*3/uL (ref 1.7–7.7)
Neutrophils Relative %: 58 %
Platelets: 278 10*3/uL (ref 150–400)
RBC: 3.74 MIL/uL — ABNORMAL LOW (ref 3.87–5.11)
RDW: 14.6 % (ref 11.5–15.5)
WBC: 5.4 10*3/uL (ref 4.0–10.5)
nRBC: 0 % (ref 0.0–0.2)

## 2018-11-04 LAB — IRON AND TIBC
Iron: 63 ug/dL (ref 28–170)
Saturation Ratios: 18 % (ref 10.4–31.8)
TIBC: 352 ug/dL (ref 250–450)
UIBC: 289 ug/dL

## 2018-11-04 LAB — VITAMIN B12: Vitamin B-12: 1067 pg/mL — ABNORMAL HIGH (ref 180–914)

## 2018-11-04 LAB — COMPREHENSIVE METABOLIC PANEL
ALT: 13 U/L (ref 0–44)
AST: 19 U/L (ref 15–41)
Albumin: 3.8 g/dL (ref 3.5–5.0)
Alkaline Phosphatase: 72 U/L (ref 38–126)
Anion gap: 8 (ref 5–15)
BUN: 25 mg/dL — ABNORMAL HIGH (ref 8–23)
CO2: 26 mmol/L (ref 22–32)
Calcium: 9.6 mg/dL (ref 8.9–10.3)
Chloride: 104 mmol/L (ref 98–111)
Creatinine, Ser: 1.2 mg/dL — ABNORMAL HIGH (ref 0.44–1.00)
GFR calc Af Amer: 53 mL/min — ABNORMAL LOW (ref >60)
GFR calc non Af Amer: 46 mL/min — ABNORMAL LOW (ref >60)
Glucose, Bld: 117 mg/dL — ABNORMAL HIGH (ref 70–99)
Potassium: 3.1 mmol/L — ABNORMAL LOW (ref 3.5–5.1)
Sodium: 138 mmol/L (ref 135–145)
Total Bilirubin: 0.2 mg/dL — ABNORMAL LOW (ref 0.3–1.2)
Total Protein: 7 g/dL (ref 6.5–8.1)

## 2018-11-04 LAB — FERRITIN: Ferritin: 23 ng/mL (ref 11–307)

## 2018-11-04 LAB — FOLATE: Folate: 35.3 ng/mL (ref >5.9)

## 2018-11-12 ENCOUNTER — Encounter (HOSPITAL_COMMUNITY): Payer: Self-pay | Admitting: Hematology

## 2018-11-12 ENCOUNTER — Other Ambulatory Visit: Payer: Self-pay

## 2018-11-12 ENCOUNTER — Inpatient Hospital Stay (HOSPITAL_BASED_OUTPATIENT_CLINIC_OR_DEPARTMENT_OTHER): Payer: Medicare Other | Admitting: Hematology

## 2018-11-12 VITALS — BP 115/74 | HR 79 | Temp 98.2°F | Resp 18 | Wt 201.4 lb

## 2018-11-12 DIAGNOSIS — D509 Iron deficiency anemia, unspecified: Secondary | ICD-10-CM | POA: Diagnosis not present

## 2018-11-12 DIAGNOSIS — Z79899 Other long term (current) drug therapy: Secondary | ICD-10-CM

## 2018-11-12 DIAGNOSIS — Z87891 Personal history of nicotine dependence: Secondary | ICD-10-CM | POA: Diagnosis not present

## 2018-11-12 DIAGNOSIS — D649 Anemia, unspecified: Secondary | ICD-10-CM

## 2018-11-12 DIAGNOSIS — D631 Anemia in chronic kidney disease: Secondary | ICD-10-CM

## 2018-11-12 DIAGNOSIS — N189 Chronic kidney disease, unspecified: Secondary | ICD-10-CM | POA: Diagnosis not present

## 2018-11-12 DIAGNOSIS — D508 Other iron deficiency anemias: Secondary | ICD-10-CM

## 2018-11-12 DIAGNOSIS — I10 Essential (primary) hypertension: Secondary | ICD-10-CM | POA: Diagnosis not present

## 2018-11-12 NOTE — Assessment & Plan Note (Signed)
1.  Normocytic anemia: - Combination anemia from relative iron deficiency state, chronic kidney disease and poor absorption. -Patient has been on iron pills for the past 2 years without much help. - Nutritional work-up was negative.  SPEP negative.  LDH was normal.  Serum erythropoietin was 50. - Colonoscopy on 07/24/2017 shows diverticulosis of the sigmoid colon, external hemorrhoids. - For him on 07/22/2018, 07/29/2018 and 04/15/2018 and 04/29/2018, 02/25/2018 and 03/04/2018. -She denies having any bleeding per rectum.  She does have occasional dark stools. -We will check her stool for occult blood. -We discussed the results of her blood work.  Hemoglobin dropped to 11.3 and ferritin dropped to 23 from 40 despite 2 infusions of iron. -I have recommended 2 more infusions of Feraheme 1 week apart.  I will repeat her blood work in 2 months.  She reports that her improved energy lasts only couple of months after each iron infusion.

## 2018-11-12 NOTE — Patient Instructions (Addendum)
[facility] at [facility] Discharge Instructions  Follow up in 2 months with labs prior.    Thank you for choosing [facility] at [facility] to provide your oncology and hematology care.  To afford each patient quality time with our provider, please arrive at least 15 minutes before your scheduled appointment time.   If you have a lab appointment with the Cancer Center please come in thru the  Main Entrance and check in at the main information desk  You need to re-schedule your appointment should you arrive 10 or more minutes late.  We strive to give you quality time with our providers, and arriving late affects you and other patients whose appointments are after yours.  Also, if you no show three or more times for appointments you may be dismissed from the clinic at the providers discretion.     Again, thank you for choosing [facility].  Our hope is that these requests will decrease the amount of time that you wait before being seen by our physicians.       _____________________________________________________________  Should you have questions after your visit to  Cancer Center, please contact our office at [phone] between the hours of 8:00 a.m. and 4:30 p.m.  Voicemails left after 4:00 p.m. will not be returned until the following business day.  For prescription refill requests, have your pharmacy contact our office and allow 72 hours.    Cancer Center Support Programs:   > Cancer Support Group  2nd Tuesday of the month 1pm-2pm, Journey Room    

## 2018-11-12 NOTE — Progress Notes (Signed)
Candice [name], [place] [id number]   CLINIC:  Medical Oncology/Hematology  PCP:  Manon Hilding, MD [place] [id number] [phone]   REASON FOR VISIT: Follow-up for normocytic anemia  CURRENT THERAPY: Intermittent iron infusions    INTERVAL HISTORY:  [name] 67 y.o. female returns for routine follow-up for normocytic anemia. She is reports she has occasional dark stools. She also gets dizzy at times. Denies any nausea, vomiting, or diarrhea. Denies any new pains. Had not noticed any recent bleeding such as epistaxis, hematuria or hematochezia. Denies recent chest pain on exertion, shortness of breath on minimal exertion, pre-syncopal episodes, or palpitations. Denies any numbness or tingling in hands or feet. Denies any recent fevers, infections, or recent hospitalizations. She reports her appetite at 100% and energy level at 50%.    REVIEW OF SYSTEMS:  Review of Systems  Neurological: Positive for dizziness.  All other systems reviewed and are negative.    PAST MEDICAL/SURGICAL HISTORY:  Past Medical History:  Diagnosis Date  . Anxiety   . Asthma   . Chronic constipation   . Diverticulosis of colon   . Dyspnea    sob with exertion  . GERD (gastroesophageal reflux disease)   . Hemorrhoids   . Herpes   . History of lumpectomy of right breast   . Hypertension   . Major depression   . Rectocele    W/ POSSIBLE CYSTOCELE   Past Surgical History:  Procedure Laterality Date  . BREAST SURGERY Right 01/17/2007   dr tim davis   excisional breast mass (papilloma)  . COLONOSCOPY  10/30/2012   Procedure: COLONOSCOPY;  Surgeon: Rogene Houston, MD;  [facility]: [facility];  Service: Endoscopy;  Laterality: N/A;  200  . COLONOSCOPY N/A 07/24/2017   Procedure: COLONOSCOPY;  Surgeon: Rogene Houston, MD;  Location: [facility];  Service: Endoscopy;  Laterality: N/A;  1230  . ESOPHAGOGASTRODUODENOSCOPY  10/30/2012   Procedure:  ESOPHAGOGASTRODUODENOSCOPY (EGD);  Surgeon: Rogene Houston, MD;  Location: [facility];  Service: Endoscopy;  Laterality: N/A;  . [place]  . NEEDLE-GUIDED EXCISION RIGHT BREAST MASS  07-13-2011  dr Margot Chimes  . RECTOCELE REPAIR N/A 08/14/2017   Procedure: POSTERIOR REPAIR (RECTOCELE) WITH ENTEROCELE REPAIR;  Surgeon: Cheri Fowler, MD;  Location: [facility];  Service: Gynecology;  Laterality: N/A;  Outpatient extended recovery  . VAGINAL HYSTERECTOMY     complete     SOCIAL HISTORY:  Social History   Socioeconomic History  . Marital status: Divorced    Spouse name: Not on file  . Number of children: Not on file  . Years of education: Not on file  . Highest education level: Not on file  Occupational History  . Not on file  Social Needs  . Financial resource strain: Not on file  . Food insecurity:    Worry: Not on file    Inability: Not on file  . Transportation needs:    Medical: Not on file    Non-medical: Not on file  Tobacco Use  . Smoking status: Former Research scientist (life sciences)  . Smokeless tobacco: Never Used  . Tobacco comment: years ago  Substance and Sexual Activity  . Alcohol use: Yes    Comment: occ wine  . Drug use: No  . Sexual activity: Not on file  Lifestyle  . Physical activity:    Days per week: Not on file    Minutes per session:  Not on file  . Stress: Not on file  Relationships  . Social connections:    Talks on phone: Not on file    Gets together: Not on file    Attends religious service: Not on file    Active member of club or organization: Not on file    Attends meetings of clubs or organizations: Not on file    Relationship status: Not on file  . Intimate partner violence:    Fear of current or ex partner: Not on file    Emotionally abused: Not on file    Physically abused: Not on file    Forced sexual activity: Not on file  Other Topics Concern  . Not on file  Social History Narrative  . Not on file    FAMILY HISTORY:   History reviewed. No pertinent family history.  CURRENT MEDICATIONS:  Outpatient Encounter Medications as of 11/12/2018  Medication Sig  . acyclovir (ZOVIRAX) 400 MG tablet Take 400 mg by mouth daily as needed.   . Cholecalciferol (VITAMIN D) 2000 units CAPS Take 2,000 Units by mouth daily. Vit d 3  . citalopram (CELEXA) 20 MG tablet Take 20 mg by mouth every morning.   . clonazePAM (KLONOPIN) 0.5 MG tablet Take 0.25 mg by mouth at bedtime.  . diclofenac (VOLTAREN) 75 MG EC tablet Take 75 mg by mouth 2 (two) times daily.  . fish oil-omega-3 fatty acids 1000 MG capsule Take 1 g by mouth daily.   . Fluticasone-Salmeterol (ADVAIR) 250-50 MCG/DOSE AEPB Inhale 1 puff into the lungs daily as needed (for shortness of breath).   Marland Kitchen ibuprofen (ADVIL,MOTRIN) 600 MG tablet Take 1 tablet (600 mg total) by mouth every 6 (six) hours as needed (mild pain).  Marland Kitchen linaclotide (LINZESS) 145 MCG CAPS capsule Take 145 mcg by mouth daily as needed (for constipation).   Marland Kitchen loratadine (CLARITIN) 10 MG tablet Take 10 mg by mouth daily.  . Multiple Vitamins-Minerals (HAIR SKIN AND NAILS FORMULA PO) Take by mouth. 5000 mcg biotin 1 tab per day  . olmesartan-hydrochlorothiazide (BENICAR HCT) 20-12.5 MG per tablet Take 1 tablet by mouth daily.    Marland Kitchen orlistat (ALLI) 60 MG capsule Take 60 mg by mouth 2 (two) times daily.  Marland Kitchen OVER THE COUNTER MEDICATION Take 1 capsule by mouth daily. OTC Abs Cut Belly Fat  . OVER THE COUNTER MEDICATION Take 1 capsule by mouth 2 (two) times daily. Lipozene  . oxyCODONE-acetaminophen (PERCOCET/ROXICET) 5-325 MG tablet Take 1-2 tablets by mouth every 4 (four) hours as needed for severe pain.  . pantoprazole (PROTONIX) 40 MG tablet Take 40 mg by mouth daily. Heartburn  . PROAIR HFA 108 (90 BASE) MCG/ACT inhaler Take 2 puffs by mouth 4 (four) times daily as needed for wheezing or shortness of breath.   Marland Kitchen UNABLE TO FIND Co q 10 100 mg liquid  q day  . UNABLE TO FIND Focus factor for brain one per day    Facility-Administered Encounter Medications as of 11/12/2018  Medication  . 0.9 %  sodium chloride infusion    ALLERGIES:  Allergies  Allergen Reactions  . Codeine Nausea Only  . Sulfur Rash     PHYSICAL EXAM:  ECOG Performance status: 1  Vitals:   11/12/18 1400  BP: 115/74  Pulse: 79  Resp: 18  Temp: 98.2 F (36.8 C)  SpO2: 96%   Filed Weights   11/12/18 1400  Weight: 201 lb 6 oz (91.3 kg)    Physical Exam Constitutional:  Appearance: Normal appearance. She is normal weight.  Musculoskeletal: Normal range of motion.  Skin:    General: Skin is warm and dry.  Neurological:     Mental Status: She is alert and oriented to person, place, and time. Mental status is at baseline.  Psychiatric:        Mood and Affect: Mood normal.        Behavior: Behavior normal.        Thought Content: Thought content normal.        Judgment: Judgment normal.      LABORATORY DATA:  I have reviewed the labs as listed.  CBC    Component Value Date/Time   WBC 5.4 11/04/2018 1228   RBC 3.74 (L) 11/04/2018 1228   HGB 11.3 (L) 11/04/2018 1228   HCT 35.4 (L) 11/04/2018 1228   PLT 278 11/04/2018 1228   MCV 94.7 11/04/2018 1228   MCH 30.2 11/04/2018 1228   MCHC 31.9 11/04/2018 1228   RDW 14.6 11/04/2018 1228   LYMPHSABS 1.3 11/04/2018 1228   MONOABS 0.6 11/04/2018 1228   EOSABS 0.4 11/04/2018 1228   BASOSABS 0.1 11/04/2018 1228   CMP Latest Ref Rng & Units 11/04/2018 02/21/2018 08/14/2017  Glucose 70 - 99 mg/dL 117(H) 109(H) 97  BUN 8 - 23 mg/dL 25(H) 24(H) 19  Creatinine 0.44 - 1.00 mg/dL 1.20(H) 1.26(H) 1.00  Sodium 135 - 145 mmol/L 138 139 139  Potassium 3.5 - 5.1 mmol/L 3.1(L) 3.9 3.9  Chloride 98 - 111 mmol/L 104 102 104  CO2 22 - 32 mmol/L 26 26 27   Calcium 8.9 - 10.3 mg/dL 9.6 9.8 9.5  Total Protein 6.5 - 8.1 g/dL 7.0 - -  Total Bilirubin 0.3 - 1.2 mg/dL 0.2(L) - -  Alkaline Phos 38 - 126 U/L 72 - -  AST 15 - 41 U/L 19 - -  ALT 0 - 44 U/L 13 - -        DIAGNOSTIC IMAGING:  I have independently reviewed the scans and discussed with the patient.   I have reviewed Francene Finders, NP's note and agree with the documentation.  I personally performed a face-to-face visit, made revisions and my assessment and plan is as follows.    ASSESSMENT & PLAN:   Iron deficiency anemia 1.  Normocytic anemia: - Combination anemia from relative iron deficiency state, chronic kidney disease and poor absorption. -Patient has been on iron pills for the past 2 years without much help. - Nutritional work-up was negative.  SPEP negative.  LDH was normal.  Serum erythropoietin was 50. - Colonoscopy on 07/24/2017 shows diverticulosis of the sigmoid colon, external hemorrhoids. - For him on 07/22/2018, 07/29/2018 and 04/15/2018 and 04/29/2018, 02/25/2018 and 03/04/2018. -She denies having any bleeding per rectum.  She does have occasional dark stools. -We will check her stool for occult blood. -We discussed the results of her blood work.  Hemoglobin dropped to 11.3 and ferritin dropped to 23 from 40 despite 2 infusions of iron. -I have recommended 2 more infusions of Feraheme 1 week apart.  I will repeat her blood work in 2 months.  She reports that her improved energy lasts only couple of months after each iron infusion.       Orders placed this encounter:  Orders Placed This Encounter  Procedures  . CBC with Differential/Platelet  . Comprehensive metabolic panel  . Ferritin  . Iron and TIBC  . Vitamin B12  . Folate  . Lactate dehydrogenase  . Occult blood x  1 card to lab, stool  . Occult blood x 1 card to lab, stool      Derek Jack, MD [place] [phone]

## 2018-11-18 DIAGNOSIS — M79672 Pain in left foot: Secondary | ICD-10-CM | POA: Diagnosis not present

## 2018-11-18 DIAGNOSIS — M722 Plantar fascial fibromatosis: Secondary | ICD-10-CM | POA: Diagnosis not present

## 2018-11-18 DIAGNOSIS — M79671 Pain in right foot: Secondary | ICD-10-CM | POA: Diagnosis not present

## 2018-11-18 DIAGNOSIS — M25579 Pain in unspecified ankle and joints of unspecified foot: Secondary | ICD-10-CM | POA: Diagnosis not present

## 2018-11-20 ENCOUNTER — Inpatient Hospital Stay (HOSPITAL_COMMUNITY): Payer: Medicare Other | Attending: Hematology

## 2018-11-20 ENCOUNTER — Encounter (HOSPITAL_COMMUNITY): Payer: Self-pay

## 2018-11-20 ENCOUNTER — Other Ambulatory Visit (HOSPITAL_COMMUNITY): Payer: Self-pay | Admitting: *Deleted

## 2018-11-20 ENCOUNTER — Ambulatory Visit (HOSPITAL_COMMUNITY): Payer: Medicare Other

## 2018-11-20 VITALS — BP 122/71 | HR 57 | Temp 98.0°F | Resp 18

## 2018-11-20 DIAGNOSIS — D509 Iron deficiency anemia, unspecified: Secondary | ICD-10-CM | POA: Insufficient documentation

## 2018-11-20 DIAGNOSIS — D649 Anemia, unspecified: Secondary | ICD-10-CM

## 2018-11-20 LAB — OCCULT BLOOD X 1 CARD TO LAB, STOOL
Fecal Occult Bld: NEGATIVE
Fecal Occult Bld: NEGATIVE
Fecal Occult Bld: NEGATIVE

## 2018-11-20 MED ORDER — SODIUM CHLORIDE 0.9 % IV SOLN
510.0000 mg | Freq: Once | INTRAVENOUS | Status: AC
  Administered 2018-11-20: 510 mg via INTRAVENOUS
  Filled 2018-11-20: qty 17

## 2018-11-20 MED ORDER — SODIUM CHLORIDE 0.9 % IV SOLN
Freq: Once | INTRAVENOUS | Status: AC
  Administered 2018-11-20: 10:00:00 via INTRAVENOUS

## 2018-11-20 MED ORDER — SODIUM CHLORIDE 0.9% FLUSH
10.0000 mL | Freq: Once | INTRAVENOUS | Status: AC | PRN
  Administered 2018-11-20: 10 mL

## 2018-11-20 NOTE — Progress Notes (Signed)
Patent tolerated iron infusion with no complaints voiced.  Good blood return noted before and after infusion.  Site clean and dry with no bruising or swelling noted at site.  Band aid applied.  VSs with discharge and left ambulatory with no s/s of distress noted.

## 2018-11-20 NOTE — Patient Instructions (Signed)
[facility] at [facility]  Discharge Instructions:  You received feraheme today.  _______________________________________________________________  Thank you for choosing [facility] at [facility] to provide your oncology and hematology care.  To afford each patient quality time with our providers, please arrive at least 15 minutes before your scheduled appointment.  You need to re-schedule your appointment if you arrive 10 or more minutes late.  We strive to give you quality time with our providers, and arriving late affects you and other patients whose appointments are after yours.  Also, if you no show three or more times for appointments you may be dismissed from the clinic.  Again, thank you for choosing [facility] at [facility]. Our hope is that these requests will allow you access to exceptional care and in a timely manner. _______________________________________________________________  If you have questions after your visit, please contact our office at [phone] between the hours of 8:30 a.m. and 5:00 p.m. Voicemails left after 4:30 p.m. will not be returned until the following business day. _______________________________________________________________  For prescription refill requests, have your pharmacy contact our office. _______________________________________________________________  Recommendations made by the consultant and any test results will be sent to your referring physician. _______________________________________________________________ 

## 2018-11-27 ENCOUNTER — Other Ambulatory Visit: Payer: Self-pay

## 2018-11-27 ENCOUNTER — Ambulatory Visit (HOSPITAL_COMMUNITY): Payer: Medicare Other

## 2018-11-27 ENCOUNTER — Encounter (HOSPITAL_COMMUNITY): Payer: Self-pay

## 2018-11-27 ENCOUNTER — Inpatient Hospital Stay (HOSPITAL_COMMUNITY): Payer: Medicare Other

## 2018-11-27 VITALS — BP 116/57 | HR 65 | Temp 98.6°F | Resp 16

## 2018-11-27 DIAGNOSIS — D649 Anemia, unspecified: Secondary | ICD-10-CM

## 2018-11-27 DIAGNOSIS — D509 Iron deficiency anemia, unspecified: Secondary | ICD-10-CM | POA: Diagnosis not present

## 2018-11-27 MED ORDER — SODIUM CHLORIDE 0.9 % IV SOLN
510.0000 mg | Freq: Once | INTRAVENOUS | Status: AC
  Administered 2018-11-27: 510 mg via INTRAVENOUS
  Filled 2018-11-27: qty 17

## 2018-11-27 MED ORDER — SODIUM CHLORIDE 0.9 % IV SOLN
Freq: Once | INTRAVENOUS | Status: AC
  Administered 2018-11-27: 09:00:00 via INTRAVENOUS

## 2018-11-27 NOTE — Patient Instructions (Signed)
[place] [facility]  Discharge Instructions:   _______________________________________________________________  Thank you for choosing [facility] at [facility] to provide your oncology and hematology care.  To afford each patient quality time with our providers, please arrive at least 15 minutes before your scheduled appointment.  You need to re-schedule your appointment if you arrive 10 or more minutes late.  We strive to give you quality time with our providers, and arriving late affects you and other patients whose appointments are after yours.  Also, if you no show three or more times for appointments you may be dismissed from the clinic.  Again, thank you for choosing [facility]. Our hope is that these requests will allow you access to exceptional care and in a timely manner. _______________________________________________________________  If you have questions after your visit, please contact our office at [phone] between the hours of 8:30 a.m. and 5:00 p.m. Voicemails left after 4:30 p.m. will not be returned until the following business day. _______________________________________________________________  For prescription refill requests, have your pharmacy contact our office. _______________________________________________________________  Recommendations made by the consultant and any test results will be sent to your referring physician. _______________________________________________________________ 

## 2018-11-27 NOTE — Progress Notes (Signed)
Treatment given per orders. Patient tolerated it well without problems. Vitals stable and discharged home from clinic ambulatory. Follow up as scheduled.  

## 2018-12-09 ENCOUNTER — Ambulatory Visit (INDEPENDENT_AMBULATORY_CARE_PROVIDER_SITE_OTHER): Payer: Medicare Other | Admitting: Otolaryngology

## 2018-12-09 DIAGNOSIS — H903 Sensorineural hearing loss, bilateral: Secondary | ICD-10-CM | POA: Diagnosis not present

## 2018-12-09 DIAGNOSIS — M25579 Pain in unspecified ankle and joints of unspecified foot: Secondary | ICD-10-CM | POA: Diagnosis not present

## 2018-12-09 DIAGNOSIS — M79672 Pain in left foot: Secondary | ICD-10-CM | POA: Diagnosis not present

## 2018-12-09 DIAGNOSIS — R42 Dizziness and giddiness: Secondary | ICD-10-CM

## 2018-12-09 DIAGNOSIS — M79671 Pain in right foot: Secondary | ICD-10-CM | POA: Diagnosis not present

## 2018-12-09 DIAGNOSIS — H9201 Otalgia, right ear: Secondary | ICD-10-CM | POA: Diagnosis not present

## 2018-12-09 DIAGNOSIS — M722 Plantar fascial fibromatosis: Secondary | ICD-10-CM | POA: Diagnosis not present

## 2018-12-09 DIAGNOSIS — H9313 Tinnitus, bilateral: Secondary | ICD-10-CM

## 2018-12-11 ENCOUNTER — Other Ambulatory Visit: Payer: Self-pay | Admitting: Otolaryngology

## 2018-12-11 ENCOUNTER — Other Ambulatory Visit (HOSPITAL_COMMUNITY): Payer: Self-pay | Admitting: Otolaryngology

## 2018-12-11 DIAGNOSIS — H9042 Sensorineural hearing loss, unilateral, left ear, with unrestricted hearing on the contralateral side: Secondary | ICD-10-CM

## 2018-12-11 DIAGNOSIS — IMO0001 Reserved for inherently not codable concepts without codable children: Secondary | ICD-10-CM

## 2018-12-20 ENCOUNTER — Ambulatory Visit (HOSPITAL_COMMUNITY): Payer: Medicare Other

## 2018-12-20 ENCOUNTER — Ambulatory Visit (HOSPITAL_COMMUNITY)
Admission: RE | Admit: 2018-12-20 | Discharge: 2018-12-20 | Disposition: A | Discharge status: Home / Self Care | Payer: Medicare Other | Source: Ambulatory Visit | Attending: Otolaryngology | Admitting: Otolaryngology

## 2018-12-20 DIAGNOSIS — IMO0001 Reserved for inherently not codable concepts without codable children: Secondary | ICD-10-CM

## 2018-12-20 DIAGNOSIS — H9012 Conductive hearing loss, unilateral, left ear, with unrestricted hearing on the contralateral side: Secondary | ICD-10-CM | POA: Diagnosis not present

## 2018-12-20 DIAGNOSIS — H9042 Sensorineural hearing loss, unilateral, left ear, with unrestricted hearing on the contralateral side: Secondary | ICD-10-CM | POA: Diagnosis not present

## 2018-12-20 MED ORDER — GADOBUTROL 1 MMOL/ML IV SOLN
10.0000 mL | Freq: Once | INTRAVENOUS | Status: AC | PRN
  Administered 2018-12-20: 10 mL via INTRAVENOUS

## 2018-12-30 DIAGNOSIS — M79672 Pain in left foot: Secondary | ICD-10-CM | POA: Diagnosis not present

## 2018-12-30 DIAGNOSIS — M79671 Pain in right foot: Secondary | ICD-10-CM | POA: Diagnosis not present

## 2018-12-30 DIAGNOSIS — M25579 Pain in unspecified ankle and joints of unspecified foot: Secondary | ICD-10-CM | POA: Diagnosis not present

## 2018-12-30 DIAGNOSIS — M779 Enthesopathy, unspecified: Secondary | ICD-10-CM | POA: Diagnosis not present

## 2018-12-31 DIAGNOSIS — I10 Essential (primary) hypertension: Secondary | ICD-10-CM | POA: Diagnosis not present

## 2018-12-31 DIAGNOSIS — I671 Cerebral aneurysm, nonruptured: Secondary | ICD-10-CM | POA: Diagnosis not present

## 2018-12-31 DIAGNOSIS — Z6832 Body mass index (BMI) 32.0-32.9, adult: Secondary | ICD-10-CM | POA: Diagnosis not present

## 2019-01-02 ENCOUNTER — Other Ambulatory Visit (HOSPITAL_COMMUNITY): Payer: Self-pay | Admitting: Neurosurgery

## 2019-01-02 ENCOUNTER — Other Ambulatory Visit: Payer: Self-pay | Admitting: Neurosurgery

## 2019-01-02 DIAGNOSIS — I671 Cerebral aneurysm, nonruptured: Secondary | ICD-10-CM

## 2019-01-08 ENCOUNTER — Inpatient Hospital Stay (HOSPITAL_COMMUNITY): Payer: Medicare Other | Attending: Hematology

## 2019-01-08 DIAGNOSIS — D509 Iron deficiency anemia, unspecified: Secondary | ICD-10-CM | POA: Insufficient documentation

## 2019-01-08 DIAGNOSIS — I129 Hypertensive chronic kidney disease with stage 1 through stage 4 chronic kidney disease, or unspecified chronic kidney disease: Secondary | ICD-10-CM | POA: Insufficient documentation

## 2019-01-08 DIAGNOSIS — D649 Anemia, unspecified: Secondary | ICD-10-CM

## 2019-01-08 DIAGNOSIS — D631 Anemia in chronic kidney disease: Secondary | ICD-10-CM | POA: Insufficient documentation

## 2019-01-08 DIAGNOSIS — N189 Chronic kidney disease, unspecified: Secondary | ICD-10-CM | POA: Insufficient documentation

## 2019-01-08 LAB — COMPREHENSIVE METABOLIC PANEL
ALT: 18 U/L (ref 0–44)
AST: 26 U/L (ref 15–41)
Albumin: 3.9 g/dL (ref 3.5–5.0)
Alkaline Phosphatase: 64 U/L (ref 38–126)
Anion gap: 10 (ref 5–15)
BUN: 18 mg/dL (ref 8–23)
CO2: 26 mmol/L (ref 22–32)
Calcium: 9.6 mg/dL (ref 8.9–10.3)
Chloride: 102 mmol/L (ref 98–111)
Creatinine, Ser: 1 mg/dL (ref 0.44–1.00)
GFR calc Af Amer: >60 mL/min (ref >60)
GFR calc non Af Amer: 57 mL/min — ABNORMAL LOW (ref >60)
Glucose, Bld: 92 mg/dL (ref 70–99)
Potassium: 3.5 mmol/L (ref 3.5–5.1)
Sodium: 138 mmol/L (ref 135–145)
Total Bilirubin: 0.6 mg/dL (ref 0.3–1.2)
Total Protein: 6.9 g/dL (ref 6.5–8.1)

## 2019-01-08 LAB — CBC WITH DIFFERENTIAL/PLATELET
Abs Immature Granulocytes: 0.03 10*3/uL (ref 0.00–0.07)
Basophils Absolute: 0 10*3/uL (ref 0.0–0.1)
Basophils Relative: 1 %
Eosinophils Absolute: 0.4 10*3/uL (ref 0.0–0.5)
Eosinophils Relative: 6 %
HCT: 34.2 % — ABNORMAL LOW (ref 36.0–46.0)
Hemoglobin: 10.7 g/dL — ABNORMAL LOW (ref 12.0–15.0)
Immature Granulocytes: 1 %
Lymphocytes Relative: 27 %
Lymphs Abs: 1.7 10*3/uL (ref 0.7–4.0)
MCH: 30.1 pg (ref 26.0–34.0)
MCHC: 31.3 g/dL (ref 30.0–36.0)
MCV: 96.1 fL (ref 80.0–100.0)
Monocytes Absolute: 0.5 10*3/uL (ref 0.1–1.0)
Monocytes Relative: 8 %
Neutro Abs: 3.8 10*3/uL (ref 1.7–7.7)
Neutrophils Relative %: 57 %
Platelets: 265 10*3/uL (ref 150–400)
RBC: 3.56 MIL/uL — ABNORMAL LOW (ref 3.87–5.11)
RDW: 15.4 % (ref 11.5–15.5)
WBC: 6.5 10*3/uL (ref 4.0–10.5)
nRBC: 0 % (ref 0.0–0.2)

## 2019-01-08 LAB — FOLATE: Folate: 61.7 ng/mL (ref >5.9)

## 2019-01-08 LAB — VITAMIN B12: Vitamin B-12: 1100 pg/mL — ABNORMAL HIGH (ref 180–914)

## 2019-01-08 LAB — IRON AND TIBC
Iron: 98 ug/dL (ref 28–170)
Saturation Ratios: 31 % (ref 10.4–31.8)
TIBC: 311 ug/dL (ref 250–450)
UIBC: 213 ug/dL

## 2019-01-08 LAB — LACTATE DEHYDROGENASE: LDH: 174 U/L (ref 98–192)

## 2019-01-08 LAB — FERRITIN: Ferritin: 197 ng/mL (ref 11–307)

## 2019-01-13 ENCOUNTER — Ambulatory Visit (INDEPENDENT_AMBULATORY_CARE_PROVIDER_SITE_OTHER): Payer: Medicare Other | Admitting: Otolaryngology

## 2019-01-13 DIAGNOSIS — H9042 Sensorineural hearing loss, unilateral, left ear, with unrestricted hearing on the contralateral side: Secondary | ICD-10-CM

## 2019-01-13 DIAGNOSIS — D333 Benign neoplasm of cranial nerves: Secondary | ICD-10-CM | POA: Diagnosis not present

## 2019-01-13 DIAGNOSIS — R42 Dizziness and giddiness: Secondary | ICD-10-CM | POA: Diagnosis not present

## 2019-01-14 ENCOUNTER — Ambulatory Visit (HOSPITAL_COMMUNITY)
Admission: RE | Admit: 2019-01-14 | Discharge: 2019-01-14 | Disposition: A | Discharge status: Home / Self Care | Payer: Medicare Other | Source: Ambulatory Visit | Attending: Neurosurgery | Admitting: Neurosurgery

## 2019-01-14 DIAGNOSIS — I671 Cerebral aneurysm, nonruptured: Secondary | ICD-10-CM | POA: Diagnosis not present

## 2019-01-14 MED ORDER — IOHEXOL 350 MG/ML SOLN
75.0000 mL | Freq: Once | INTRAVENOUS | Status: AC | PRN
  Administered 2019-01-14: 75 mL via INTRAVENOUS

## 2019-01-15 ENCOUNTER — Inpatient Hospital Stay (HOSPITAL_COMMUNITY): Payer: Medicare Other | Attending: Hematology | Admitting: Hematology

## 2019-01-15 ENCOUNTER — Other Ambulatory Visit: Payer: Self-pay

## 2019-01-15 ENCOUNTER — Encounter (HOSPITAL_COMMUNITY): Payer: Self-pay | Admitting: Hematology

## 2019-01-15 VITALS — BP 126/61 | HR 87 | Temp 98.2°F | Resp 18 | Wt 202.3 lb

## 2019-01-15 DIAGNOSIS — D631 Anemia in chronic kidney disease: Secondary | ICD-10-CM

## 2019-01-15 DIAGNOSIS — I10 Essential (primary) hypertension: Secondary | ICD-10-CM | POA: Diagnosis not present

## 2019-01-15 DIAGNOSIS — K921 Melena: Secondary | ICD-10-CM | POA: Diagnosis not present

## 2019-01-15 DIAGNOSIS — I129 Hypertensive chronic kidney disease with stage 1 through stage 4 chronic kidney disease, or unspecified chronic kidney disease: Secondary | ICD-10-CM | POA: Insufficient documentation

## 2019-01-15 DIAGNOSIS — Z79899 Other long term (current) drug therapy: Secondary | ICD-10-CM

## 2019-01-15 DIAGNOSIS — R0602 Shortness of breath: Secondary | ICD-10-CM | POA: Insufficient documentation

## 2019-01-15 DIAGNOSIS — R5383 Other fatigue: Secondary | ICD-10-CM | POA: Diagnosis not present

## 2019-01-15 DIAGNOSIS — Z791 Long term (current) use of non-steroidal anti-inflammatories (NSAID): Secondary | ICD-10-CM | POA: Diagnosis not present

## 2019-01-15 DIAGNOSIS — N189 Chronic kidney disease, unspecified: Secondary | ICD-10-CM | POA: Diagnosis not present

## 2019-01-15 DIAGNOSIS — D508 Other iron deficiency anemias: Secondary | ICD-10-CM

## 2019-01-15 DIAGNOSIS — D649 Anemia, unspecified: Secondary | ICD-10-CM

## 2019-01-15 DIAGNOSIS — K909 Intestinal malabsorption, unspecified: Secondary | ICD-10-CM | POA: Insufficient documentation

## 2019-01-15 DIAGNOSIS — Z87891 Personal history of nicotine dependence: Secondary | ICD-10-CM

## 2019-01-15 DIAGNOSIS — K219 Gastro-esophageal reflux disease without esophagitis: Secondary | ICD-10-CM | POA: Diagnosis not present

## 2019-01-15 DIAGNOSIS — F418 Other specified anxiety disorders: Secondary | ICD-10-CM

## 2019-01-15 DIAGNOSIS — D509 Iron deficiency anemia, unspecified: Secondary | ICD-10-CM | POA: Diagnosis not present

## 2019-01-15 NOTE — Assessment & Plan Note (Signed)
1.  Normocytic anemia: - Combination anemia from relative iron deficiency state, chronic kidney disease and poor absorption. -Patient has been on iron pills for the past 2 years without much help. - Nutritional work-up was negative.  SPEP negative.  LDH was normal.  Serum erythropoietin was 50. - Colonoscopy on 07/24/2017 shows diverticulosis of the sigmoid colon, external hemorrhoids. - Last Feraheme was on 11/20/2018 and 11/27/2018. - Stool cards on 11/20/2018 was negative x3. - She denies any bleeding per rectum or melena.  She does have occasional dark stools.  Her hemoglobin today is 10.7. -She is complaining of starting to feel tired.  I have recommended one more infusion of Feraheme. -We will see her back in 2 months for follow-up with repeat blood work.

## 2019-01-15 NOTE — Progress Notes (Signed)
[name], [place] [id number]   CLINIC:  Medical Oncology/Hematology  PCP:  Lanelle Bal, PA-C [place] [phone]   REASON FOR VISIT: Follow-up for normocytic anemia  CURRENT THERAPY:Intermittent iron infusions    INTERVAL HISTORY:  [name] 68 y.o. female returns for routine follow-up for normocytic anemia. She reports she occasionally has dark stools. She reports occasionally she wipes and sees blood in her stool. She felt she felt better after her two doses of feraheme and now she is starting to feel fatigued again. She had a CT scan yesterday and she has 2 cerebral anyursums. One is 8 mm and one is 3 mm. She is being referred to a neurosurgereon. Denies any nausea, vomiting, or diarrhea. Denies any new pains. Had not noticed any recent bleeding such as epistaxis, hematuria or hematochezia. Denies recent chest pain on exertion, shortness of breath on minimal exertion, pre-syncopal episodes, or palpitations. Denies any numbness or tingling in hands or feet. Denies any recent fevers, infections, or recent hospitalizations. Patient reports appetite at 100% and energy level at 50%. She is eating well and maintaining her weight at this time.     REVIEW OF SYSTEMS:  Review of Systems  Constitutional: Positive for fatigue.  Respiratory: Positive for shortness of breath.   Neurological: Positive for numbness.  All other systems reviewed and are negative.    PAST MEDICAL/SURGICAL HISTORY:  Past Medical History:  Diagnosis Date  . Anxiety   . Asthma   . Chronic constipation   . Diverticulosis of colon   . Dyspnea    sob with exertion  . GERD (gastroesophageal reflux disease)   . Hemorrhoids   . Herpes   . History of lumpectomy of right breast   . Hypertension   . Major depression   . Rectocele    W/ POSSIBLE CYSTOCELE   Past Surgical History:  Procedure Laterality Date  . BREAST SURGERY Right 01/17/2007    dr tim davis   excisional breast mass (papilloma)  . COLONOSCOPY  10/30/2012   Procedure: COLONOSCOPY;  Surgeon: Rogene Houston, MD;  [facility];  Service: Endoscopy;  Laterality: N/A;  200  . COLONOSCOPY N/A 07/24/2017   Procedure: COLONOSCOPY;  Surgeon: Rogene Houston, MD;  [facility];  Service: Endoscopy;  Laterality: N/A;  1230  . ESOPHAGOGASTRODUODENOSCOPY  10/30/2012   Procedure: ESOPHAGOGASTRODUODENOSCOPY (EGD);  Surgeon: Rogene Houston, MD;  [facility];  Service: Endoscopy;  Laterality: N/A;  . [place]  . NEEDLE-GUIDED EXCISION RIGHT BREAST MASS  07-13-2011  dr Margot Chimes  . RECTOCELE REPAIR N/A 08/14/2017   Procedure: POSTERIOR REPAIR (RECTOCELE) WITH ENTEROCELE REPAIR;  Surgeon: Cheri Fowler, MD;  Location: [facility];  Service: Gynecology;  Laterality: N/A;  Outpatient extended recovery  . VAGINAL HYSTERECTOMY     complete     SOCIAL HISTORY:  Social History   Socioeconomic History  . Marital status: Divorced    Spouse name: Not on file  . Number of children: Not on file  . Years of education: Not on file  . Highest education level: Not on file  Occupational History  . Not on file  Social Needs  . Financial resource strain: Not on file  . Food insecurity:    Worry: Not on file    Inability: Not on file  . Transportation needs:    Medical: Not on file  Non-medical: Not on file  Tobacco Use  . Smoking status: Former Research scientist (life sciences)  . Smokeless tobacco: Never Used  . Tobacco comment: years ago  Substance and Sexual Activity  . Alcohol use: Yes    Comment: occ wine  . Drug use: No  . Sexual activity: Not on file  Lifestyle  . Physical activity:    Days per week: Not on file    Minutes per session: Not on file  . Stress: Not on file  Relationships  . Social connections:    Talks on phone: Not on file    Gets together: Not on file    Attends religious service: Not on file    Active member of  club or organization: Not on file    Attends meetings of clubs or organizations: Not on file    Relationship status: Not on file  . Intimate partner violence:    Fear of current or ex partner: Not on file    Emotionally abused: Not on file    Physically abused: Not on file    Forced sexual activity: Not on file  Other Topics Concern  . Not on file  Social History Narrative  . Not on file    FAMILY HISTORY:  History reviewed. No pertinent family history.  CURRENT MEDICATIONS:  Outpatient Encounter Medications as of 01/15/2019  Medication Sig  . acyclovir (ZOVIRAX) 400 MG tablet Take 400 mg by mouth daily as needed.   . Cholecalciferol (VITAMIN D) 2000 units CAPS Take 2,000 Units by mouth daily. Vit d 3  . citalopram (CELEXA) 20 MG tablet Take 20 mg by mouth every morning.   . clonazePAM (KLONOPIN) 0.5 MG tablet Take 0.25 mg by mouth at bedtime.  . cyclobenzaprine (FLEXERIL) 10 MG tablet TK 1 T PO  HS PRN P  . diclofenac (VOLTAREN) 75 MG EC tablet Take 75 mg by mouth 2 (two) times daily.  . Fluticasone-Salmeterol (ADVAIR) 250-50 MCG/DOSE AEPB Inhale 1 puff into the lungs daily as needed (for shortness of breath).   . linaclotide (LINZESS) 145 MCG CAPS capsule Take 145 mcg by mouth daily as needed (for constipation).   Marland Kitchen loratadine (CLARITIN) 10 MG tablet Take 10 mg by mouth daily.  . meclizine (ANTIVERT) 25 MG tablet TK 1 T PO TID PRN  . Multiple Vitamins-Minerals (HAIR SKIN AND NAILS FORMULA PO) Take by mouth. 5000 mcg biotin 1 tab per day  . olmesartan-hydrochlorothiazide (BENICAR HCT) 20-12.5 MG per tablet Take 1 tablet by mouth daily.    Marland Kitchen orlistat (ALLI) 60 MG capsule Take 60 mg by mouth 2 (two) times daily.  Marland Kitchen OVER THE COUNTER MEDICATION Take 1 capsule by mouth daily. OTC Abs Cut Belly Fat  . OVER THE COUNTER MEDICATION Take 1 capsule by mouth 2 (two) times daily. Lipozene  . pantoprazole (PROTONIX) 40 MG tablet Take 40 mg by mouth daily. Heartburn  . PROAIR HFA 108 (90 BASE)  MCG/ACT inhaler Take 2 puffs by mouth 4 (four) times daily as needed for wheezing or shortness of breath.   Marland Kitchen UNABLE TO FIND Co q 10 100 mg liquid  q day  . UNABLE TO FIND Focus factor for brain one per day  . fish oil-omega-3 fatty acids 1000 MG capsule Take 1 g by mouth daily.   Marland Kitchen ibuprofen (ADVIL,MOTRIN) 600 MG tablet Take 1 tablet (600 mg total) by mouth every 6 (six) hours as needed (mild pain). (Patient not taking: Reported on 01/15/2019)  . oxyCODONE-acetaminophen (PERCOCET/ROXICET) 5-325 MG tablet Take  1-2 tablets by mouth every 4 (four) hours as needed for severe pain. (Patient not taking: Reported on 01/15/2019)   Facility-Administered Encounter Medications as of 01/15/2019  Medication  . 0.9 %  sodium chloride infusion    ALLERGIES:  Allergies  Allergen Reactions  . Codeine Nausea Only  . Sulfur Rash     PHYSICAL EXAM:  ECOG Performance status: 1  VITALS SIGNS:BP: 126/61, P:87, R:18, T:98.2, O2: 98% WEIGHT:202.3  Physical Exam Constitutional:      Appearance: Normal appearance. She is normal weight.  Musculoskeletal: Normal range of motion.  Skin:    General: Skin is warm and dry.  Neurological:     Mental Status: She is alert and oriented to person, place, and time. Mental status is at baseline.  Psychiatric:        Mood and Affect: Mood normal.        Behavior: Behavior normal.        Thought Content: Thought content normal.        Judgment: Judgment normal.      LABORATORY DATA:  I have reviewed the labs as listed.  CBC    Component Value Date/Time   WBC 6.5 01/08/2019 1410   RBC 3.56 (L) 01/08/2019 1410   HGB 10.7 (L) 01/08/2019 1410   HCT 34.2 (L) 01/08/2019 1410   PLT 265 01/08/2019 1410   MCV 96.1 01/08/2019 1410   MCH 30.1 01/08/2019 1410   MCHC 31.3 01/08/2019 1410   RDW 15.4 01/08/2019 1410   LYMPHSABS 1.7 01/08/2019 1410   MONOABS 0.5 01/08/2019 1410   EOSABS 0.4 01/08/2019 1410   BASOSABS 0.0 01/08/2019 1410   CMP Latest Ref Rng & Units  01/08/2019 11/04/2018 02/21/2018  Glucose 70 - 99 mg/dL 92 117(H) 109(H)  BUN 8 - 23 mg/dL 18 25(H) 24(H)  Creatinine 0.44 - 1.00 mg/dL 1.00 1.20(H) 1.26(H)  Sodium 135 - 145 mmol/L 138 138 139  Potassium 3.5 - 5.1 mmol/L 3.5 3.1(L) 3.9  Chloride 98 - 111 mmol/L 102 104 102  CO2 22 - 32 mmol/L 26 26 26   Calcium 8.9 - 10.3 mg/dL 9.6 9.6 9.8  Total Protein 6.5 - 8.1 g/dL 6.9 7.0 -  Total Bilirubin 0.3 - 1.2 mg/dL 0.6 0.2(L) -  Alkaline Phos 38 - 126 U/L 64 72 -  AST 15 - 41 U/L 26 19 -  ALT 0 - 44 U/L 18 13 -       DIAGNOSTIC IMAGING:  I have independently reviewed the scans and discussed with the patient.   I have reviewed Francene Finders, NP's note and agree with the documentation.  I personally performed a face-to-face visit, made revisions and my assessment and plan is as follows.    ASSESSMENT & PLAN:   Iron deficiency anemia 1.  Normocytic anemia: - Combination anemia from relative iron deficiency state, chronic kidney disease and poor absorption. -Patient has been on iron pills for the past 2 years without much help. - Nutritional work-up was negative.  SPEP negative.  LDH was normal.  Serum erythropoietin was 50. - Colonoscopy on 07/24/2017 shows diverticulosis of the sigmoid colon, external hemorrhoids. - Last Feraheme was on 11/20/2018 and 11/27/2018. - Stool cards on 11/20/2018 was negative x3. - She denies any bleeding per rectum or melena.  She does have occasional dark stools.  Her hemoglobin today is 10.7. -She is complaining of starting to feel tired.  I have recommended one more infusion of Feraheme. -We will see her back in 2 months  for follow-up with repeat blood work.      Orders placed this encounter:  Orders Placed This Encounter  Procedures  . CBC with Differential/Platelet  . Comprehensive metabolic panel  . Ferritin  . Iron and TIBC  . Vitamin B12  . Folate      Derek Jack, MD [place] [phone]

## 2019-01-15 NOTE — Patient Instructions (Signed)
[place] at [facility] Discharge Instructions  You were seen today by Dr. Delton Coombes, he went over how you've been feeling and if you have had been feeling better since your iron infusions. He went over your recent lab results and your levels have dropped some. We will give you another iron infusion and then see you back in 2 months for labs and follow up.   Thank you for choosing [place] at [facility] to provide your oncology and hematology care.  To afford each patient quality time with our provider, please arrive at least 15 minutes before your scheduled appointment time.   If you have a lab appointment with the [facility] please come in thru the  Main Entrance and check in at the main information desk  You need to re-schedule your appointment should you arrive 10 or more minutes late.  We strive to give you quality time with our providers, and arriving late affects you and other patients whose appointments are after yours.  Also, if you no show three or more times for appointments you may be dismissed from the clinic at the providers discretion.     Again, thank you for choosing [facility].  Our hope is that these requests will decrease the amount of time that you wait before being seen by our physicians.       _____________________________________________________________  Should you have questions after your visit to [facility], please contact our office at [phone] between the hours of 8:00 a.m. and 4:30 p.m.  Voicemails left after 4:00 p.m. will not be returned until the following business day.  For prescription refill requests, have your pharmacy contact our office and allow 72 hours.    Cancer Center Support Programs:   > Cancer Support Group  2nd Tuesday of the month 1pm-2pm, Journey Room

## 2019-01-16 DIAGNOSIS — M25579 Pain in unspecified ankle and joints of unspecified foot: Secondary | ICD-10-CM | POA: Diagnosis not present

## 2019-01-16 DIAGNOSIS — M79672 Pain in left foot: Secondary | ICD-10-CM | POA: Diagnosis not present

## 2019-01-16 DIAGNOSIS — M722 Plantar fascial fibromatosis: Secondary | ICD-10-CM | POA: Diagnosis not present

## 2019-01-16 DIAGNOSIS — M79671 Pain in right foot: Secondary | ICD-10-CM | POA: Diagnosis not present

## 2019-01-22 DIAGNOSIS — Z23 Encounter for immunization: Secondary | ICD-10-CM | POA: Diagnosis not present

## 2019-01-22 DIAGNOSIS — F331 Major depressive disorder, recurrent, moderate: Secondary | ICD-10-CM | POA: Diagnosis not present

## 2019-01-22 DIAGNOSIS — Z6832 Body mass index (BMI) 32.0-32.9, adult: Secondary | ICD-10-CM | POA: Diagnosis not present

## 2019-01-22 DIAGNOSIS — R739 Hyperglycemia, unspecified: Secondary | ICD-10-CM | POA: Diagnosis not present

## 2019-01-22 DIAGNOSIS — K210 Gastro-esophageal reflux disease with esophagitis: Secondary | ICD-10-CM | POA: Diagnosis not present

## 2019-01-22 DIAGNOSIS — D508 Other iron deficiency anemias: Secondary | ICD-10-CM | POA: Diagnosis not present

## 2019-01-22 DIAGNOSIS — I10 Essential (primary) hypertension: Secondary | ICD-10-CM | POA: Diagnosis not present

## 2019-01-22 DIAGNOSIS — J45909 Unspecified asthma, uncomplicated: Secondary | ICD-10-CM | POA: Diagnosis not present

## 2019-01-22 DIAGNOSIS — E782 Mixed hyperlipidemia: Secondary | ICD-10-CM | POA: Diagnosis not present

## 2019-01-22 DIAGNOSIS — F411 Generalized anxiety disorder: Secondary | ICD-10-CM | POA: Diagnosis not present

## 2019-01-29 ENCOUNTER — Inpatient Hospital Stay (HOSPITAL_COMMUNITY): Payer: Medicare Other

## 2019-01-29 ENCOUNTER — Other Ambulatory Visit: Payer: Self-pay

## 2019-01-29 VITALS — BP 152/87 | HR 86 | Temp 97.8°F | Resp 18

## 2019-01-29 DIAGNOSIS — D631 Anemia in chronic kidney disease: Secondary | ICD-10-CM | POA: Diagnosis not present

## 2019-01-29 DIAGNOSIS — K909 Intestinal malabsorption, unspecified: Secondary | ICD-10-CM | POA: Diagnosis not present

## 2019-01-29 DIAGNOSIS — D509 Iron deficiency anemia, unspecified: Secondary | ICD-10-CM | POA: Diagnosis not present

## 2019-01-29 DIAGNOSIS — K921 Melena: Secondary | ICD-10-CM | POA: Diagnosis not present

## 2019-01-29 DIAGNOSIS — I129 Hypertensive chronic kidney disease with stage 1 through stage 4 chronic kidney disease, or unspecified chronic kidney disease: Secondary | ICD-10-CM | POA: Diagnosis not present

## 2019-01-29 DIAGNOSIS — D649 Anemia, unspecified: Secondary | ICD-10-CM

## 2019-01-29 DIAGNOSIS — N189 Chronic kidney disease, unspecified: Secondary | ICD-10-CM | POA: Diagnosis not present

## 2019-01-29 MED ORDER — SODIUM CHLORIDE 0.9 % IV SOLN
510.0000 mg | Freq: Once | INTRAVENOUS | Status: AC
  Administered 2019-01-29: 510 mg via INTRAVENOUS
  Filled 2019-01-29: qty 17

## 2019-01-29 MED ORDER — SODIUM CHLORIDE 0.9 % IV SOLN
INTRAVENOUS | Status: DC
  Administered 2019-01-29: 09:00:00 via INTRAVENOUS

## 2019-01-29 NOTE — Patient Instructions (Signed)
[facility] at [facility]  Discharge Instructions:   _______________________________________________________________  Thank you for choosing [facility] at [facility] to provide your oncology and hematology care.  To afford each patient quality time with our providers, please arrive at least 15 minutes before your scheduled appointment.  You need to re-schedule your appointment if you arrive 10 or more minutes late.  We strive to give you quality time with our providers, and arriving late affects you and other patients whose appointments are after yours.  Also, if you no show three or more times for appointments you may be dismissed from the clinic.  Again, thank you for choosing [facility] at [facility]. Our hope is that these requests will allow you access to exceptional care and in a timely manner. _______________________________________________________________  If you have questions after your visit, please contact our office at [phone] between the hours of 8:30 a.m. and 5:00 p.m. Voicemails left after 4:30 p.m. will not be returned until the following business day. _______________________________________________________________  For prescription refill requests, have your pharmacy contact our office. _______________________________________________________________  Recommendations made by the consultant and any test results will be sent to your referring physician. _______________________________________________________________ 

## 2019-01-29 NOTE — Progress Notes (Signed)
Pt presents today for Feraheme. VSS. No complaints of any changes since the last visit. MAR reviewed and updated.   Feraheme given today per MD orders. Tolerated infusion without adverse affects. Vital signs stable. No complaints at this time. Discharged from clinic ambulatory. F/U with [facility] as scheduled.

## 2019-02-03 DIAGNOSIS — I671 Cerebral aneurysm, nonruptured: Secondary | ICD-10-CM | POA: Diagnosis not present

## 2019-02-20 DIAGNOSIS — M79672 Pain in left foot: Secondary | ICD-10-CM | POA: Diagnosis not present

## 2019-02-20 DIAGNOSIS — M25579 Pain in unspecified ankle and joints of unspecified foot: Secondary | ICD-10-CM | POA: Diagnosis not present

## 2019-02-20 DIAGNOSIS — M722 Plantar fascial fibromatosis: Secondary | ICD-10-CM | POA: Diagnosis not present

## 2019-02-20 DIAGNOSIS — M79671 Pain in right foot: Secondary | ICD-10-CM | POA: Diagnosis not present

## 2019-02-27 DIAGNOSIS — Z6833 Body mass index (BMI) 33.0-33.9, adult: Secondary | ICD-10-CM | POA: Diagnosis not present

## 2019-02-27 DIAGNOSIS — I10 Essential (primary) hypertension: Secondary | ICD-10-CM | POA: Diagnosis not present

## 2019-02-27 DIAGNOSIS — I671 Cerebral aneurysm, nonruptured: Secondary | ICD-10-CM | POA: Diagnosis not present

## 2019-04-02 ENCOUNTER — Inpatient Hospital Stay (HOSPITAL_COMMUNITY): Payer: Medicare Other | Attending: Hematology

## 2019-04-02 ENCOUNTER — Other Ambulatory Visit: Payer: Self-pay

## 2019-04-02 DIAGNOSIS — Z7951 Long term (current) use of inhaled steroids: Secondary | ICD-10-CM | POA: Diagnosis not present

## 2019-04-02 DIAGNOSIS — D509 Iron deficiency anemia, unspecified: Secondary | ICD-10-CM | POA: Diagnosis not present

## 2019-04-02 DIAGNOSIS — Z87891 Personal history of nicotine dependence: Secondary | ICD-10-CM | POA: Insufficient documentation

## 2019-04-02 DIAGNOSIS — I10 Essential (primary) hypertension: Secondary | ICD-10-CM | POA: Diagnosis not present

## 2019-04-02 DIAGNOSIS — F419 Anxiety disorder, unspecified: Secondary | ICD-10-CM | POA: Insufficient documentation

## 2019-04-02 DIAGNOSIS — Z791 Long term (current) use of non-steroidal anti-inflammatories (NSAID): Secondary | ICD-10-CM | POA: Insufficient documentation

## 2019-04-02 DIAGNOSIS — F329 Major depressive disorder, single episode, unspecified: Secondary | ICD-10-CM | POA: Diagnosis not present

## 2019-04-02 DIAGNOSIS — Z79899 Other long term (current) drug therapy: Secondary | ICD-10-CM | POA: Diagnosis not present

## 2019-04-02 DIAGNOSIS — D649 Anemia, unspecified: Secondary | ICD-10-CM

## 2019-04-02 DIAGNOSIS — J45909 Unspecified asthma, uncomplicated: Secondary | ICD-10-CM | POA: Diagnosis not present

## 2019-04-02 LAB — VITAMIN B12: Vitamin B-12: 1592 pg/mL — ABNORMAL HIGH (ref 180–914)

## 2019-04-02 LAB — CBC WITH DIFFERENTIAL/PLATELET
Abs Immature Granulocytes: 0.02 10*3/uL (ref 0.00–0.07)
Basophils Absolute: 0 10*3/uL (ref 0.0–0.1)
Basophils Relative: 1 %
Eosinophils Absolute: 0.3 10*3/uL (ref 0.0–0.5)
Eosinophils Relative: 5 %
HCT: 36.3 % (ref 36.0–46.0)
Hemoglobin: 11.3 g/dL — ABNORMAL LOW (ref 12.0–15.0)
Immature Granulocytes: 0 %
Lymphocytes Relative: 28 %
Lymphs Abs: 1.6 10*3/uL (ref 0.7–4.0)
MCH: 30.7 pg (ref 26.0–34.0)
MCHC: 31.1 g/dL (ref 30.0–36.0)
MCV: 98.6 fL (ref 80.0–100.0)
Monocytes Absolute: 0.5 10*3/uL (ref 0.1–1.0)
Monocytes Relative: 9 %
Neutro Abs: 3.3 10*3/uL (ref 1.7–7.7)
Neutrophils Relative %: 57 %
Platelets: 303 10*3/uL (ref 150–400)
RBC: 3.68 MIL/uL — ABNORMAL LOW (ref 3.87–5.11)
RDW: 14.7 % (ref 11.5–15.5)
WBC: 5.7 10*3/uL (ref 4.0–10.5)
nRBC: 0 % (ref 0.0–0.2)

## 2019-04-02 LAB — COMPREHENSIVE METABOLIC PANEL
ALT: 16 U/L (ref 0–44)
AST: 24 U/L (ref 15–41)
Albumin: 4.3 g/dL (ref 3.5–5.0)
Alkaline Phosphatase: 73 U/L (ref 38–126)
Anion gap: 11 (ref 5–15)
BUN: 14 mg/dL (ref 8–23)
CO2: 26 mmol/L (ref 22–32)
Calcium: 9.6 mg/dL (ref 8.9–10.3)
Chloride: 101 mmol/L (ref 98–111)
Creatinine, Ser: 1.08 mg/dL — ABNORMAL HIGH (ref 0.44–1.00)
GFR calc Af Amer: >60 mL/min (ref >60)
GFR calc non Af Amer: 52 mL/min — ABNORMAL LOW (ref >60)
Glucose, Bld: 109 mg/dL — ABNORMAL HIGH (ref 70–99)
Potassium: 3.5 mmol/L (ref 3.5–5.1)
Sodium: 138 mmol/L (ref 135–145)
Total Bilirubin: 0.5 mg/dL (ref 0.3–1.2)
Total Protein: 7.3 g/dL (ref 6.5–8.1)

## 2019-04-02 LAB — IRON AND TIBC
Iron: 49 ug/dL (ref 28–170)
Saturation Ratios: 12 % (ref 10.4–31.8)
TIBC: 393 ug/dL (ref 250–450)
UIBC: 344 ug/dL

## 2019-04-02 LAB — FOLATE: Folate: 41.5 ng/mL (ref >5.9)

## 2019-04-02 LAB — FERRITIN: Ferritin: 18 ng/mL (ref 11–307)

## 2019-04-09 ENCOUNTER — Inpatient Hospital Stay (HOSPITAL_BASED_OUTPATIENT_CLINIC_OR_DEPARTMENT_OTHER): Payer: Medicare Other | Admitting: Nurse Practitioner

## 2019-04-09 ENCOUNTER — Other Ambulatory Visit: Payer: Self-pay

## 2019-04-09 ENCOUNTER — Encounter (HOSPITAL_COMMUNITY): Payer: Self-pay | Admitting: Nurse Practitioner

## 2019-04-09 DIAGNOSIS — I10 Essential (primary) hypertension: Secondary | ICD-10-CM

## 2019-04-09 DIAGNOSIS — Z7951 Long term (current) use of inhaled steroids: Secondary | ICD-10-CM

## 2019-04-09 DIAGNOSIS — Z79899 Other long term (current) drug therapy: Secondary | ICD-10-CM | POA: Diagnosis not present

## 2019-04-09 DIAGNOSIS — F329 Major depressive disorder, single episode, unspecified: Secondary | ICD-10-CM

## 2019-04-09 DIAGNOSIS — Z791 Long term (current) use of non-steroidal anti-inflammatories (NSAID): Secondary | ICD-10-CM

## 2019-04-09 DIAGNOSIS — D649 Anemia, unspecified: Secondary | ICD-10-CM

## 2019-04-09 DIAGNOSIS — J45909 Unspecified asthma, uncomplicated: Secondary | ICD-10-CM | POA: Diagnosis not present

## 2019-04-09 DIAGNOSIS — F419 Anxiety disorder, unspecified: Secondary | ICD-10-CM

## 2019-04-09 DIAGNOSIS — D509 Iron deficiency anemia, unspecified: Secondary | ICD-10-CM

## 2019-04-09 DIAGNOSIS — Z87891 Personal history of nicotine dependence: Secondary | ICD-10-CM

## 2019-04-09 NOTE — Addendum Note (Signed)
Addended by: Glennie [place] on: 04/09/2019 02:12 PM   Modules accepted: Orders

## 2019-04-09 NOTE — Patient Instructions (Signed)
[place] at [facility] Discharge Instructions  Follow up in 3 months with labs  We will set you up with 2 infusions of iron.  Thank you for choosing [place] at [facility] to provide your oncology and hematology care.  To afford each patient quality time with our provider, please arrive at least 15 minutes before your scheduled appointment time.   If you have a lab appointment with the [facility] please come in thru the  Main Entrance and check in at the main information desk  You need to re-schedule your appointment should you arrive 10 or more minutes late.  We strive to give you quality time with our providers, and arriving late affects you and other patients whose appointments are after yours.  Also, if you no show three or more times for appointments you may be dismissed from the clinic at the providers discretion.     Again, thank you for choosing [facility].  Our hope is that these requests will decrease the amount of time that you wait before being seen by our physicians.       _____________________________________________________________  Should you have questions after your visit to [facility], please contact our office at [phone] between the hours of 8:00 a.m. and 4:30 p.m.  Voicemails left after 4:00 p.m. will not be returned until the following business day.  For prescription refill requests, have your pharmacy contact our office and allow 72 hours.    Cancer Center Support Programs:   > Cancer Support Group  2nd Tuesday of the month 1pm-2pm, Journey Room

## 2019-04-09 NOTE — Progress Notes (Signed)
Candice [name], [place] [address]   CLINIC:  Medical Oncology/Hematology  PCP:  Lanelle Bal, PA-C [place] [address] [phone]   REASON FOR VISIT: Follow-up for iron deficiency anemia  CURRENT THERAPY: Intermittent IV iron   INTERVAL HISTORY:  Candice [name] 68 y.o. female returns for routine follow-up for iron deficiency anemia.  She reports she has been feeling more fatigued lately and running out of energy earlier in the day.  She does report she sees blood in her stool occasionally when she is constipated.  She denies any dark stools. Denies any nausea, vomiting, or diarrhea. Denies any new pains. Had not noticed any recent bleeding such as epistaxis, hematuria or hematochezia. Denies recent chest pain on exertion, shortness of breath on minimal exertion, pre-syncopal episodes, or palpitations. Denies any numbness or tingling in hands or feet. Denies any recent fevers, infections, or recent hospitalizations. Patient reports appetite at 100% and energy level at 75%.  She is eating well and maintaining her weight at this time.   REVIEW OF SYSTEMS:  Review of Systems  Constitutional: Positive for fatigue.  Respiratory: Positive for shortness of breath.   All other systems reviewed and are negative.    PAST MEDICAL/SURGICAL HISTORY:  Past Medical History:  Diagnosis Date  . Anxiety   . Asthma   . Chronic constipation   . Diverticulosis of colon   . Dyspnea    sob with exertion  . GERD (gastroesophageal reflux disease)   . Hemorrhoids   . Herpes   . History of lumpectomy of right breast   . Hypertension   . Major depression   . Rectocele    W/ POSSIBLE CYSTOCELE   Past Surgical History:  Procedure Laterality Date  . BREAST SURGERY Right 01/17/2007   dr tim davis   excisional breast mass (papilloma)  . COLONOSCOPY  10/30/2012   Procedure: COLONOSCOPY;  Surgeon: Rogene Houston, MD;  [facility];  Service:  Endoscopy;  Laterality: N/A;  200  . COLONOSCOPY N/A 07/24/2017   Procedure: COLONOSCOPY;  Surgeon: Rogene Houston, MD;  [facility];  Service: Endoscopy;  Laterality: N/A;  1230  . ESOPHAGOGASTRODUODENOSCOPY  10/30/2012   Procedure: ESOPHAGOGASTRODUODENOSCOPY (EGD);  Surgeon: Rogene Houston, MD;  [facility];  Service: Endoscopy;  Laterality: N/A;  . [place]  . NEEDLE-GUIDED EXCISION RIGHT BREAST MASS  07-13-2011  dr Margot Chimes  . RECTOCELE REPAIR N/A 08/14/2017   Procedure: POSTERIOR REPAIR (RECTOCELE) WITH ENTEROCELE REPAIR;  Surgeon: Cheri Fowler, MD;  Location: [facility];  Service: Gynecology;  Laterality: N/A;  Outpatient extended recovery  . VAGINAL HYSTERECTOMY     complete     SOCIAL HISTORY:  Social History   Socioeconomic History  . Marital status: Divorced    Spouse name: Not on file  . Number of children: Not on file  . Years of education: Not on file  . Highest education level: Not on file  Occupational History  . Not on file  Social Needs  . Financial resource strain: Not on file  . Food insecurity:    Worry: Not on file    Inability: Not on file  . Transportation needs:    Medical: Not on file    Non-medical: Not on file  Tobacco Use  . Smoking status: Former Research scientist (life sciences)  . Smokeless tobacco: Never Used  . Tobacco comment: years ago  Substance and Sexual Activity  .  Alcohol use: Yes    Comment: occ wine  . Drug use: No  . Sexual activity: Not on file  Lifestyle  . Physical activity:    Days per week: Not on file    Minutes per session: Not on file  . Stress: Not on file  Relationships  . Social connections:    Talks on phone: Not on file    Gets together: Not on file    Attends religious service: Not on file    Active member of club or organization: Not on file    Attends meetings of clubs or organizations: Not on file    Relationship status: Not on file  . Intimate partner violence:    Fear  of current or ex partner: Not on file    Emotionally abused: Not on file    Physically abused: Not on file    Forced sexual activity: Not on file  Other Topics Concern  . Not on file  Social History Narrative  . Not on file    FAMILY HISTORY:  History reviewed. No pertinent family history.  CURRENT MEDICATIONS:  Outpatient Encounter Medications as of 04/09/2019  Medication Sig  . acyclovir (ZOVIRAX) 400 MG tablet Take 400 mg by mouth daily as needed.   . Cholecalciferol (VITAMIN D) 2000 units CAPS Take 2,000 Units by mouth daily. Vit d 3  . citalopram (CELEXA) 20 MG tablet Take 20 mg by mouth every morning.   . clonazePAM (KLONOPIN) 0.5 MG tablet Take 0.25 mg by mouth at bedtime.  . cyclobenzaprine (FLEXERIL) 10 MG tablet TK 1 T PO  HS PRN P  . diclofenac (VOLTAREN) 75 MG EC tablet Take 75 mg by mouth 2 (two) times daily.  . fish oil-omega-3 fatty acids 1000 MG capsule Take 1 g by mouth daily.   . Fluticasone-Salmeterol (ADVAIR) 250-50 MCG/DOSE AEPB Inhale 1 puff into the lungs daily as needed (for shortness of breath).   Marland Kitchen ibuprofen (ADVIL,MOTRIN) 600 MG tablet Take 1 tablet (600 mg total) by mouth every 6 (six) hours as needed (mild pain).  Marland Kitchen linaclotide (LINZESS) 145 MCG CAPS capsule Take 145 mcg by mouth daily as needed (for constipation).   Marland Kitchen loratadine (CLARITIN) 10 MG tablet Take 10 mg by mouth daily.  . meclizine (ANTIVERT) 25 MG tablet TK 1 T PO TID PRN  . Multiple Vitamins-Minerals (HAIR SKIN AND NAILS FORMULA PO) Take by mouth. 5000 mcg biotin 1 tab per day  . olmesartan-hydrochlorothiazide (BENICAR HCT) 20-12.5 MG per tablet Take 1 tablet by mouth daily.    Marland Kitchen orlistat (ALLI) 60 MG capsule Take 60 mg by mouth 2 (two) times daily.  Marland Kitchen OVER THE COUNTER MEDICATION Take 1 capsule by mouth daily. OTC Abs Cut Belly Fat  . OVER THE COUNTER MEDICATION Take 1 capsule by mouth 2 (two) times daily. Lipozene  . oxyCODONE-acetaminophen (PERCOCET/ROXICET) 5-325 MG tablet Take 1-2 tablets  by mouth every 4 (four) hours as needed for severe pain.  . pantoprazole (PROTONIX) 40 MG tablet Take 40 mg by mouth daily. Heartburn  . PROAIR HFA 108 (90 BASE) MCG/ACT inhaler Take 2 puffs by mouth 4 (four) times daily as needed for wheezing or shortness of breath.   Marland Kitchen UNABLE TO FIND Co q 10 100 mg liquid  q day  . UNABLE TO FIND Focus factor for brain one per day  . ibuprofen (ADVIL) 800 MG tablet TK 1 T PO TID   Facility-Administered Encounter Medications as of 04/09/2019  Medication  . 0.9 %  sodium chloride infusion    ALLERGIES:  Allergies  Allergen Reactions  . Codeine Nausea Only  . Sulfur Rash     PHYSICAL EXAM:  ECOG Performance status: 1  Vitals:   04/09/19 1300  BP: 133/87  Pulse: 91  Resp: 16  Temp: 98 F (36.7 C)  SpO2: 95%   Filed Weights   04/09/19 1300  Weight: 208 lb 2 oz (94.4 kg)    Physical Exam Constitutional:      Appearance: Normal appearance. She is normal weight.  Cardiovascular:     Rate and Rhythm: Normal rate and regular rhythm.     Heart sounds: Normal heart sounds.  Pulmonary:     Effort: Pulmonary effort is normal.     Breath sounds: Normal breath sounds.  Abdominal:     General: Bowel sounds are normal.     Palpations: Abdomen is soft.  Musculoskeletal: Normal range of motion.  Skin:    General: Skin is warm and dry.  Neurological:     Mental Status: She is alert and oriented to person, place, and time. Mental status is at baseline.  Psychiatric:        Mood and Affect: Mood normal.        Behavior: Behavior normal.        Thought Content: Thought content normal.        Judgment: Judgment normal.      LABORATORY DATA:  I have reviewed the labs as listed.  CBC    Component Value Date/Time   WBC 5.7 04/02/2019 1251   RBC 3.68 (L) 04/02/2019 1251   HGB 11.3 (L) 04/02/2019 1251   HCT 36.3 04/02/2019 1251   PLT 303 04/02/2019 1251   MCV 98.6 04/02/2019 1251   MCH 30.7 04/02/2019 1251   MCHC 31.1 04/02/2019 1251    RDW 14.7 04/02/2019 1251   LYMPHSABS 1.6 04/02/2019 1251   MONOABS 0.5 04/02/2019 1251   EOSABS 0.3 04/02/2019 1251   BASOSABS 0.0 04/02/2019 1251   CMP Latest Ref Rng & Units 04/02/2019 01/08/2019 11/04/2018  Glucose 70 - 99 mg/dL 109(H) 92 117(H)  BUN 8 - 23 mg/dL 14 18 25(H)  Creatinine 0.44 - 1.00 mg/dL 1.08(H) 1.00 1.20(H)  Sodium 135 - 145 mmol/L 138 138 138  Potassium 3.5 - 5.1 mmol/L 3.5 3.5 3.1(L)  Chloride 98 - 111 mmol/L 101 102 104  CO2 22 - 32 mmol/L 26 26 26   Calcium 8.9 - 10.3 mg/dL 9.6 9.6 9.6  Total Protein 6.5 - 8.1 g/dL 7.3 6.9 7.0  Total Bilirubin 0.3 - 1.2 mg/dL 0.5 0.6 0.2(L)  Alkaline Phos 38 - 126 U/L 73 64 72  AST 15 - 41 U/L 24 26 19   ALT 0 - 44 U/L 16 18 13    I personally performed a face-to-face visit.  All questions were answered to patient's stated satisfaction. Encouraged patient to call with any new concerns or questions before his next visit to the cancer center and we can certain see him sooner, if needed.     ASSESSMENT & PLAN:   Normochromic normocytic anemia 1.  Normocytic anemia: - Combination of anemia from relative iron deficiency state, chronic kidney disease, and poor absorption. - Patient has been on oral iron supplements for the past 2 years without much help. - Last Feraheme was on 01/29/2019. -Nutritional work-up was negative.  SPEP negative.  LDH was normal.  Serum erythropoietin was 50.  Stool cards on 11/20/2018 was negative x3. -Colonoscopy on 07/24/2017 showed diverticulosis of  the sigmoid colon, external hemorrhoids. -She denies any bleeding per rectum or melena.  She does have occasional dark stools. -She has complaints of feeling tired throughout the day. -Labs on 04/02/2019 showed her hemoglobin at 11.3, platelets at 303, ferritin at 18, and percent saturation at 12. -I recommend her getting 2 more infusion of IV iron. - We will see her back in 3 months with repeat labs.      Orders placed this encounter:  No orders of the  defined types were placed in this encounter.     Francene Finders, FNP-C [place] [phone]

## 2019-04-09 NOTE — Assessment & Plan Note (Addendum)
1.  Normocytic anemia: - Combination of anemia from relative iron deficiency state, chronic kidney disease, and poor absorption. - Patient has been on oral iron supplements for the past 2 years without much help. - Last Feraheme was on 01/29/2019. -Nutritional work-up was negative.  SPEP negative.  LDH was normal.  Serum erythropoietin was 50.  Stool cards on 11/20/2018 was negative x3. -Colonoscopy on 07/24/2017 showed diverticulosis of the sigmoid colon, external hemorrhoids. -She denies any bleeding per rectum or melena.  She does have occasional dark stools. -She has complaints of feeling tired throughout the day. -Labs on 04/02/2019 showed her hemoglobin at 11.3, platelets at 303, ferritin at 18, and percent saturation at 12. -I recommend her getting 2 more infusion of IV iron. - We will see her back in 3 months with repeat labs.

## 2019-04-11 ENCOUNTER — Inpatient Hospital Stay (HOSPITAL_COMMUNITY): Payer: Medicare Other

## 2019-04-11 ENCOUNTER — Encounter (HOSPITAL_COMMUNITY): Payer: Self-pay

## 2019-04-11 ENCOUNTER — Other Ambulatory Visit: Payer: Self-pay

## 2019-04-11 VITALS — BP 112/60 | HR 77 | Temp 98.0°F | Resp 18

## 2019-04-11 DIAGNOSIS — D649 Anemia, unspecified: Secondary | ICD-10-CM

## 2019-04-11 DIAGNOSIS — F329 Major depressive disorder, single episode, unspecified: Secondary | ICD-10-CM | POA: Diagnosis not present

## 2019-04-11 DIAGNOSIS — F419 Anxiety disorder, unspecified: Secondary | ICD-10-CM | POA: Diagnosis not present

## 2019-04-11 DIAGNOSIS — D509 Iron deficiency anemia, unspecified: Secondary | ICD-10-CM | POA: Diagnosis not present

## 2019-04-11 DIAGNOSIS — I10 Essential (primary) hypertension: Secondary | ICD-10-CM | POA: Diagnosis not present

## 2019-04-11 DIAGNOSIS — J45909 Unspecified asthma, uncomplicated: Secondary | ICD-10-CM | POA: Diagnosis not present

## 2019-04-11 DIAGNOSIS — Z87891 Personal history of nicotine dependence: Secondary | ICD-10-CM | POA: Diagnosis not present

## 2019-04-11 MED ORDER — SODIUM CHLORIDE 0.9 % IV SOLN
510.0000 mg | Freq: Once | INTRAVENOUS | Status: AC
  Administered 2019-04-11: 510 mg via INTRAVENOUS
  Filled 2019-04-11: qty 17

## 2019-04-11 MED ORDER — SODIUM CHLORIDE 0.9 % IV SOLN
Freq: Once | INTRAVENOUS | Status: AC
  Administered 2019-04-11: 09:00:00 via INTRAVENOUS

## 2019-04-11 NOTE — Patient Instructions (Signed)
[facility] at [facility] Discharge Instructions  Received Feraheme infusion today. Follow-up as scheduled. Call clinic for any questions or concerns   Thank you for choosing [facility] at [facility] to provide your oncology and hematology care.  To afford each patient quality time with our provider, please arrive at least 15 minutes before your scheduled appointment time.   If you have a lab appointment with the Cancer Center please come in thru the  Main Entrance and check in at the main information desk  You need to re-schedule your appointment should you arrive 10 or more minutes late.  We strive to give you quality time with our providers, and arriving late affects you and other patients whose appointments are after yours.  Also, if you no show three or more times for appointments you may be dismissed from the clinic at the providers discretion.     Again, thank you for choosing [facility].  Our hope is that these requests will decrease the amount of time that you wait before being seen by our physicians.       _____________________________________________________________  Should you have questions after your visit to [facility], please contact our office at [phone] between the hours of 8:00 a.m. and 4:30 p.m.  Voicemails left after 4:00 p.m. will not be returned until the following business day.  For prescription refill requests, have your pharmacy contact our office and allow 72 hours.    Cancer Center Support Programs:   > Cancer Support Group  2nd Tuesday of the month 1pm-2pm, Journey Room   

## 2019-04-11 NOTE — Progress Notes (Signed)
Candice [name] tolerated Feraheme infusion well without complaints or incident. VSS upon discharge. Pt discharged self ambulatory in satisfactory condition

## 2019-04-18 ENCOUNTER — Encounter (HOSPITAL_COMMUNITY): Payer: Self-pay

## 2019-04-18 ENCOUNTER — Other Ambulatory Visit: Payer: Self-pay

## 2019-04-18 ENCOUNTER — Inpatient Hospital Stay (HOSPITAL_COMMUNITY): Payer: Medicare Other | Attending: Hematology

## 2019-04-18 VITALS — BP 122/61 | HR 73 | Temp 98.2°F | Resp 18

## 2019-04-18 DIAGNOSIS — D509 Iron deficiency anemia, unspecified: Secondary | ICD-10-CM | POA: Diagnosis not present

## 2019-04-18 DIAGNOSIS — D649 Anemia, unspecified: Secondary | ICD-10-CM

## 2019-04-18 MED ORDER — SODIUM CHLORIDE 0.9 % IV SOLN
Freq: Once | INTRAVENOUS | Status: AC
  Administered 2019-04-18: 13:00:00 via INTRAVENOUS

## 2019-04-18 MED ORDER — SODIUM CHLORIDE 0.9 % IV SOLN
510.0000 mg | Freq: Once | INTRAVENOUS | Status: AC
  Administered 2019-04-18: 510 mg via INTRAVENOUS
  Filled 2019-04-18: qty 510

## 2019-04-18 NOTE — Patient Instructions (Signed)
[facility] at [facility]  Discharge Instructions:   _______________________________________________________________  Thank you for choosing [facility] at [facility] to provide your oncology and hematology care.  To afford each patient quality time with our providers, please arrive at least 15 minutes before your scheduled appointment.  You need to re-schedule your appointment if you arrive 10 or more minutes late.  We strive to give you quality time with our providers, and arriving late affects you and other patients whose appointments are after yours.  Also, if you no show three or more times for appointments you may be dismissed from the clinic.  Again, thank you for choosing [facility] at [facility]. Our hope is that these requests will allow you access to exceptional care and in a timely manner. _______________________________________________________________  If you have questions after your visit, please contact our office at [phone] between the hours of 8:30 a.m. and 5:00 p.m. Voicemails left after 4:30 p.m. will not be returned until the following business day. _______________________________________________________________  For prescription refill requests, have your pharmacy contact our office. _______________________________________________________________  Recommendations made by the consultant and any test results will be sent to your referring physician. _______________________________________________________________ 

## 2019-04-18 NOTE — Progress Notes (Signed)
Feraheme given today per MD orders. Tolerated infusion without adverse affects. Vital signs stable. No complaints at this time. Pt refused to wait the entire 30 minutes after Feraheme infused. Pt teaching performed pertaining to policy. Understanding verbalized by patient. Discharged from clinic ambulatory. F/U with [facility] as scheduled.

## 2019-06-18 ENCOUNTER — Other Ambulatory Visit: Payer: Self-pay | Admitting: Neurosurgery

## 2019-06-18 DIAGNOSIS — I671 Cerebral aneurysm, nonruptured: Secondary | ICD-10-CM

## 2019-06-19 ENCOUNTER — Other Ambulatory Visit: Payer: Self-pay | Admitting: Neurosurgery

## 2019-06-19 ENCOUNTER — Other Ambulatory Visit: Payer: Self-pay

## 2019-06-19 ENCOUNTER — Inpatient Hospital Stay (HOSPITAL_COMMUNITY): Payer: Medicare Other | Attending: Hematology

## 2019-06-19 DIAGNOSIS — D508 Other iron deficiency anemias: Secondary | ICD-10-CM | POA: Insufficient documentation

## 2019-06-19 DIAGNOSIS — Z87891 Personal history of nicotine dependence: Secondary | ICD-10-CM | POA: Insufficient documentation

## 2019-06-19 DIAGNOSIS — Z79899 Other long term (current) drug therapy: Secondary | ICD-10-CM | POA: Insufficient documentation

## 2019-06-19 DIAGNOSIS — D649 Anemia, unspecified: Secondary | ICD-10-CM

## 2019-06-19 DIAGNOSIS — F419 Anxiety disorder, unspecified: Secondary | ICD-10-CM | POA: Diagnosis not present

## 2019-06-19 DIAGNOSIS — J45909 Unspecified asthma, uncomplicated: Secondary | ICD-10-CM | POA: Diagnosis not present

## 2019-06-19 LAB — COMPREHENSIVE METABOLIC PANEL
ALT: 18 U/L (ref 0–44)
AST: 21 U/L (ref 15–41)
Albumin: 4.1 g/dL (ref 3.5–5.0)
Alkaline Phosphatase: 72 U/L (ref 38–126)
Anion gap: 6 (ref 5–15)
BUN: 19 mg/dL (ref 8–23)
CO2: 28 mmol/L (ref 22–32)
Calcium: 9.5 mg/dL (ref 8.9–10.3)
Chloride: 102 mmol/L (ref 98–111)
Creatinine, Ser: 1.07 mg/dL — ABNORMAL HIGH (ref 0.44–1.00)
GFR calc Af Amer: >60 mL/min (ref >60)
GFR calc non Af Amer: 53 mL/min — ABNORMAL LOW (ref >60)
Glucose, Bld: 100 mg/dL — ABNORMAL HIGH (ref 70–99)
Potassium: 3.9 mmol/L (ref 3.5–5.1)
Sodium: 136 mmol/L (ref 135–145)
Total Bilirubin: 0.4 mg/dL (ref 0.3–1.2)
Total Protein: 7.5 g/dL (ref 6.5–8.1)

## 2019-06-19 LAB — CBC WITH DIFFERENTIAL/PLATELET
Abs Immature Granulocytes: 0.01 10*3/uL (ref 0.00–0.07)
Basophils Absolute: 0 10*3/uL (ref 0.0–0.1)
Basophils Relative: 1 %
Eosinophils Absolute: 0.3 10*3/uL (ref 0.0–0.5)
Eosinophils Relative: 5 %
HCT: 36.6 % (ref 36.0–46.0)
Hemoglobin: 11.7 g/dL — ABNORMAL LOW (ref 12.0–15.0)
Immature Granulocytes: 0 %
Lymphocytes Relative: 25 %
Lymphs Abs: 1.4 10*3/uL (ref 0.7–4.0)
MCH: 30.8 pg (ref 26.0–34.0)
MCHC: 32 g/dL (ref 30.0–36.0)
MCV: 96.3 fL (ref 80.0–100.0)
Monocytes Absolute: 0.5 10*3/uL (ref 0.1–1.0)
Monocytes Relative: 9 %
Neutro Abs: 3.4 10*3/uL (ref 1.7–7.7)
Neutrophils Relative %: 60 %
Platelets: 260 10*3/uL (ref 150–400)
RBC: 3.8 MIL/uL — ABNORMAL LOW (ref 3.87–5.11)
RDW: 15.9 % — ABNORMAL HIGH (ref 11.5–15.5)
WBC: 5.7 10*3/uL (ref 4.0–10.5)
nRBC: 0 % (ref 0.0–0.2)

## 2019-06-19 LAB — VITAMIN B12: Vitamin B-12: 1434 pg/mL — ABNORMAL HIGH (ref 180–914)

## 2019-06-19 LAB — LACTATE DEHYDROGENASE: LDH: 133 U/L (ref 98–192)

## 2019-06-19 LAB — IRON AND TIBC
Iron: 78 ug/dL (ref 28–170)
Saturation Ratios: 24 % (ref 10.4–31.8)
TIBC: 331 ug/dL (ref 250–450)
UIBC: 253 ug/dL

## 2019-06-19 LAB — FERRITIN: Ferritin: 88 ng/mL (ref 11–307)

## 2019-06-19 LAB — FOLATE: Folate: 58.1 ng/mL (ref >5.9)

## 2019-06-20 LAB — VITAMIN D 25 HYDROXY (VIT D DEFICIENCY, FRACTURES): Vit D, 25-Hydroxy: 39.7 ng/mL (ref 30.0–100.0)

## 2019-06-25 ENCOUNTER — Other Ambulatory Visit: Payer: Self-pay

## 2019-06-25 ENCOUNTER — Inpatient Hospital Stay (HOSPITAL_BASED_OUTPATIENT_CLINIC_OR_DEPARTMENT_OTHER): Payer: Medicare Other | Admitting: Nurse Practitioner

## 2019-06-25 DIAGNOSIS — F419 Anxiety disorder, unspecified: Secondary | ICD-10-CM | POA: Diagnosis not present

## 2019-06-25 DIAGNOSIS — Z79899 Other long term (current) drug therapy: Secondary | ICD-10-CM | POA: Diagnosis not present

## 2019-06-25 DIAGNOSIS — D508 Other iron deficiency anemias: Secondary | ICD-10-CM | POA: Diagnosis not present

## 2019-06-25 DIAGNOSIS — D649 Anemia, unspecified: Secondary | ICD-10-CM | POA: Diagnosis not present

## 2019-06-25 DIAGNOSIS — J45909 Unspecified asthma, uncomplicated: Secondary | ICD-10-CM | POA: Diagnosis not present

## 2019-06-25 DIAGNOSIS — Z87891 Personal history of nicotine dependence: Secondary | ICD-10-CM | POA: Diagnosis not present

## 2019-06-25 NOTE — Patient Instructions (Signed)
[place] at [facility] Discharge Instructions  Follow up in 3 months with labs one day prior.    Thank you for choosing [place] at [facility] to provide your oncology and hematology care.  To afford each patient quality time with our provider, please arrive at least 15 minutes before your scheduled appointment time.   If you have a lab appointment with the [facility] please come in thru the  Main Entrance and check in at the main information desk  You need to re-schedule your appointment should you arrive 10 or more minutes late.  We strive to give you quality time with our providers, and arriving late affects you and other patients whose appointments are after yours.  Also, if you no show three or more times for appointments you may be dismissed from the clinic at the providers discretion.     Again, thank you for choosing [facility].  Our hope is that these requests will decrease the amount of time that you wait before being seen by our physicians.       _____________________________________________________________  Should you have questions after your visit to [facility], please contact our office at [phone] between the hours of 8:00 a.m. and 4:30 p.m.  Voicemails left after 4:00 p.m. will not be returned until the following business day.  For prescription refill requests, have your pharmacy contact our office and allow 72 hours.    Cancer Center Support Programs:   > Cancer Support Group  2nd Tuesday of the month 1pm-2pm, Journey Room

## 2019-06-25 NOTE — Assessment & Plan Note (Addendum)
1.  Normocytic anemia: - Combination of anemia from relative iron deficiency state, chronic kidney disease, and poor absorption. - Patient has been on oral iron supplements for the past 2 years without much help. - Last Feraheme was on 04/11/2019 and 04/18/2019 -Nutritional work-up was negative.  SPEP negative.  LDH was normal.  Serum erythropoietin was 50.  Stool cards on 11/20/2018 was negative x3. -Colonoscopy on 07/24/2017 showed diverticulosis of the sigmoid colon, external hemorrhoids. -She denies any bleeding per rectum or melena.  She does have occasional dark stools. -She has complaints of feeling tired throughout the day. -Labs on 06/19/2019 showed her hemoglobin at 11.7, platelets at 260, ferritin at 88, percent saturation at 24. -I recommend her getting 2 more iron infusions. - We will see her back in 3 months with labs the day prior.

## 2019-06-25 NOTE — Progress Notes (Signed)
Candice [name], [place] [address]   CLINIC:  Medical Oncology/Hematology  PCP:  Lanelle Bal, PA-C [place] [address] [phone]   REASON FOR VISIT: Follow-up for normocytic anemia  CURRENT THERAPY: Intermittent iron infusions   INTERVAL HISTORY:  Candice [name] 68 y.o. female returns for routine follow-up for normocytic anemia.  She reports she has been doing well since her last visit.  She does report that she gets fatigued at times when she does activities. Denies any nausea, vomiting, or diarrhea. Denies any new pains. Had not noticed any recent bleeding such as epistaxis, hematuria or hematochezia. Denies recent chest pain on exertion, shortness of breath on minimal exertion, pre-syncopal episodes, or palpitations. Denies any numbness or tingling in hands or feet. Denies any recent fevers, infections, or recent hospitalizations. Patient reports appetite at 75 % and energy level at 50 %.  She is eating well maintaining her weight at this time.    REVIEW OF SYSTEMS:  Review of Systems  Constitutional: Positive for fatigue.  All other systems reviewed and are negative.    PAST MEDICAL/SURGICAL HISTORY:  Past Medical History:  Diagnosis Date  . Anxiety   . Asthma   . Chronic constipation   . Diverticulosis of colon   . Dyspnea    sob with exertion  . GERD (gastroesophageal reflux disease)   . Hemorrhoids   . Herpes   . History of lumpectomy of right breast   . Hypertension   . Major depression   . Rectocele    W/ POSSIBLE CYSTOCELE   Past Surgical History:  Procedure Laterality Date  . BREAST SURGERY Right 01/17/2007   dr tim davis   excisional breast mass (papilloma)  . COLONOSCOPY  10/30/2012   Procedure: COLONOSCOPY;  Surgeon: Rogene Houston, MD;  [facility];  Service: Endoscopy;  Laterality: N/A;  200  . COLONOSCOPY N/A 07/24/2017   Procedure: COLONOSCOPY;  Surgeon: Rogene Houston, MD;  [facility];  Service: Endoscopy;  Laterality: N/A;  1230  . ESOPHAGOGASTRODUODENOSCOPY  10/30/2012   Procedure: ESOPHAGOGASTRODUODENOSCOPY (EGD);  Surgeon: Rogene Houston, MD;  [facility];  Service: Endoscopy;  Laterality: N/A;  . [place]  . NEEDLE-GUIDED EXCISION RIGHT BREAST MASS  07-13-2011  dr Margot Chimes  . RECTOCELE REPAIR N/A 08/14/2017   Procedure: POSTERIOR REPAIR (RECTOCELE) WITH ENTEROCELE REPAIR;  Surgeon: Cheri Fowler, MD;  Location: [facility];  Service: Gynecology;  Laterality: N/A;  Outpatient extended recovery  . VAGINAL HYSTERECTOMY     complete     SOCIAL HISTORY:  Social History   Socioeconomic History  . Marital status: Divorced    Spouse name: Not on file  . Number of children: Not on file  . Years of education: Not on file  . Highest education level: Not on file  Occupational History  . Not on file  Social Needs  . Financial resource strain: Not on file  . Food insecurity    Worry: Not on file    Inability: Not on file  . Transportation needs    Medical: Not on file    Non-medical: Not on file  Tobacco Use  . Smoking status: Former Research scientist (life sciences)  . Smokeless tobacco: Never Used  . Tobacco comment: years ago  Substance and Sexual Activity  . Alcohol use: Yes    Comment: occ wine  . Drug use: No  . Sexual activity: Not on  file  Lifestyle  . Physical activity    Days per week: Not on file    Minutes per session: Not on file  . Stress: Not on file  Relationships  . Social Herbalist on phone: Not on file    Gets together: Not on file    Attends religious service: Not on file    Active member of club or organization: Not on file    Attends meetings of clubs or organizations: Not on file    Relationship status: Not on file  . Intimate partner violence    Fear of current or ex partner: Not on file    Emotionally abused: Not on file    Physically abused: Not on file    Forced sexual activity: Not on  file  Other Topics Concern  . Not on file  Social History Narrative  . Not on file    FAMILY HISTORY:  No family history on file.  CURRENT MEDICATIONS:  Outpatient Encounter Medications as of 06/25/2019  Medication Sig  . acyclovir (ZOVIRAX) 400 MG tablet Take 400 mg by mouth daily as needed.   . Cholecalciferol (VITAMIN D) 2000 units CAPS Take 2,000 Units by mouth daily. Vit d 3  . citalopram (CELEXA) 20 MG tablet Take 20 mg by mouth every morning.   . clonazePAM (KLONOPIN) 0.5 MG tablet Take 0.25 mg by mouth at bedtime.  . cyclobenzaprine (FLEXERIL) 10 MG tablet TK 1 T PO  HS PRN P  . diclofenac (VOLTAREN) 75 MG EC tablet Take 75 mg by mouth 2 (two) times daily.  . fish oil-omega-3 fatty acids 1000 MG capsule Take 1 g by mouth daily.   . Fluticasone-Salmeterol (ADVAIR) 250-50 MCG/DOSE AEPB Inhale 1 puff into the lungs daily as needed (for shortness of breath).   Marland Kitchen ibuprofen (ADVIL) 800 MG tablet TK 1 T PO TID  . ibuprofen (ADVIL,MOTRIN) 600 MG tablet Take 1 tablet (600 mg total) by mouth every 6 (six) hours as needed (mild pain).  Marland Kitchen linaclotide (LINZESS) 145 MCG CAPS capsule Take 145 mcg by mouth daily as needed (for constipation).   Marland Kitchen loratadine (CLARITIN) 10 MG tablet Take 10 mg by mouth daily.  . meclizine (ANTIVERT) 25 MG tablet TK 1 T PO TID PRN  . Multiple Vitamins-Minerals (HAIR SKIN AND NAILS FORMULA PO) Take by mouth. 5000 mcg biotin 1 tab per day  . olmesartan-hydrochlorothiazide (BENICAR HCT) 20-12.5 MG per tablet Take 1 tablet by mouth daily.    Marland Kitchen orlistat (ALLI) 60 MG capsule Take 60 mg by mouth 2 (two) times daily.  Marland Kitchen OVER THE COUNTER MEDICATION Take 1 capsule by mouth daily. OTC Abs Cut Belly Fat  . OVER THE COUNTER MEDICATION Take 1 capsule by mouth 2 (two) times daily. Lipozene  . pantoprazole (PROTONIX) 40 MG tablet Take 40 mg by mouth daily. Heartburn  . PROAIR HFA 108 (90 BASE) MCG/ACT inhaler Take 2 puffs by mouth 4 (four) times daily as needed for wheezing or  shortness of breath.   Marland Kitchen SHINGRIX injection   . UNABLE TO FIND Co q 10 100 mg liquid  q day  . UNABLE TO FIND Focus factor for brain one per day  . oxyCODONE-acetaminophen (PERCOCET/ROXICET) 5-325 MG tablet Take 1-2 tablets by mouth every 4 (four) hours as needed for severe pain. (Patient not taking: Reported on 06/25/2019)   Facility-Administered Encounter Medications as of 06/25/2019  Medication  . 0.9 %  sodium chloride infusion    ALLERGIES:  Allergies  Allergen Reactions  . Codeine Nausea Only  . Sulfur Rash     PHYSICAL EXAM:  ECOG Performance status: 1  Vitals:   06/25/19 1044  BP: 106/68  Pulse: 91  Resp: 18  Temp: 97.8 F (36.6 C)  SpO2: 97%   Filed Weights   06/25/19 1044  Weight: 212 lb (96.2 kg)    Physical Exam Constitutional:      Appearance: Normal appearance. She is normal weight.  Cardiovascular:     Rate and Rhythm: Normal rate and regular rhythm.     Heart sounds: Normal heart sounds.  Pulmonary:     Effort: Pulmonary effort is normal.     Breath sounds: Normal breath sounds.  Abdominal:     General: Bowel sounds are normal.     Palpations: Abdomen is soft.  Musculoskeletal: Normal range of motion.  Skin:    General: Skin is warm and dry.  Neurological:     Mental Status: She is alert and oriented to person, place, and time. Mental status is at baseline.  Psychiatric:        Mood and Affect: Mood normal.        Behavior: Behavior normal.        Thought Content: Thought content normal.        Judgment: Judgment normal.      LABORATORY DATA:  I have reviewed the labs as listed.  CBC    Component Value Date/Time   WBC 5.7 06/19/2019 1200   RBC 3.80 (L) 06/19/2019 1200   HGB 11.7 (L) 06/19/2019 1200   HCT 36.6 06/19/2019 1200   PLT 260 06/19/2019 1200   MCV 96.3 06/19/2019 1200   MCH 30.8 06/19/2019 1200   MCHC 32.0 06/19/2019 1200   RDW 15.9 (H) 06/19/2019 1200   LYMPHSABS 1.4 06/19/2019 1200   MONOABS 0.5 06/19/2019 1200    EOSABS 0.3 06/19/2019 1200   BASOSABS 0.0 06/19/2019 1200   CMP Latest Ref Rng & Units 06/19/2019 04/02/2019 01/08/2019  Glucose 70 - 99 mg/dL 100(H) 109(H) 92  BUN 8 - 23 mg/dL 19 14 18   Creatinine 0.44 - 1.00 mg/dL 1.07(H) 1.08(H) 1.00  Sodium 135 - 145 mmol/L 136 138 138  Potassium 3.5 - 5.1 mmol/L 3.9 3.5 3.5  Chloride 98 - 111 mmol/L 102 101 102  CO2 22 - 32 mmol/L 28 26 26   Calcium 8.9 - 10.3 mg/dL 9.5 9.6 9.6  Total Protein 6.5 - 8.1 g/dL 7.5 7.3 6.9  Total Bilirubin 0.3 - 1.2 mg/dL 0.4 0.5 0.6  Alkaline Phos 38 - 126 U/L 72 73 64  AST 15 - 41 U/L 21 24 26   ALT 0 - 44 U/L 18 16 18     I personally performed a face-to-face visit.  All questions were answered to patient's stated satisfaction. Encouraged patient to call with any new concerns or questions before his next visit to the cancer center and we can certain see him sooner, if needed.     ASSESSMENT & PLAN:   Normochromic normocytic anemia 1.  Normocytic anemia: - Combination of anemia from relative iron deficiency state, chronic kidney disease, and poor absorption. - Patient has been on oral iron supplements for the past 2 years without much help. - Last Feraheme was on 04/11/2019 and 04/18/2019 -Nutritional work-up was negative.  SPEP negative.  LDH was normal.  Serum erythropoietin was 50.  Stool cards on 11/20/2018 was negative x3. -Colonoscopy on 07/24/2017 showed diverticulosis of the sigmoid colon, external hemorrhoids. -She  denies any bleeding per rectum or melena.  She does have occasional dark stools. -She has complaints of feeling tired throughout the day. -Labs on 06/19/2019 showed her hemoglobin at 11.7, platelets at 260, ferritin at 88, percent saturation at 24. -I recommend her getting 2 more iron infusions. - We will see her back in 3 months with labs the day prior.      Orders placed this encounter:  Orders Placed This Encounter  Procedures  . Lactate dehydrogenase  . CBC with Differential/Platelet  .  Comprehensive metabolic panel  . Ferritin  . Iron and TIBC  . Vitamin B12  . VITAMIN D 25 Hydroxy (Vit-D Deficiency, Fractures)  . Folate     Francene Finders, FNP-C [place] [phone]

## 2019-06-30 ENCOUNTER — Encounter (HOSPITAL_COMMUNITY): Payer: Self-pay

## 2019-06-30 ENCOUNTER — Other Ambulatory Visit: Payer: Self-pay

## 2019-06-30 ENCOUNTER — Inpatient Hospital Stay (HOSPITAL_COMMUNITY): Payer: Medicare Other

## 2019-06-30 ENCOUNTER — Ambulatory Visit (HOSPITAL_COMMUNITY): Payer: Medicare Other

## 2019-06-30 VITALS — BP 125/61 | HR 94 | Temp 97.8°F | Resp 18

## 2019-06-30 DIAGNOSIS — J45909 Unspecified asthma, uncomplicated: Secondary | ICD-10-CM | POA: Diagnosis not present

## 2019-06-30 DIAGNOSIS — I671 Cerebral aneurysm, nonruptured: Secondary | ICD-10-CM | POA: Diagnosis not present

## 2019-06-30 DIAGNOSIS — D508 Other iron deficiency anemias: Secondary | ICD-10-CM | POA: Diagnosis not present

## 2019-06-30 DIAGNOSIS — F419 Anxiety disorder, unspecified: Secondary | ICD-10-CM | POA: Diagnosis not present

## 2019-06-30 DIAGNOSIS — D649 Anemia, unspecified: Secondary | ICD-10-CM

## 2019-06-30 DIAGNOSIS — Z87891 Personal history of nicotine dependence: Secondary | ICD-10-CM | POA: Diagnosis not present

## 2019-06-30 DIAGNOSIS — Z79899 Other long term (current) drug therapy: Secondary | ICD-10-CM | POA: Diagnosis not present

## 2019-06-30 MED ORDER — SODIUM CHLORIDE 0.9 % IV SOLN: Freq: Once | INTRAVENOUS | Status: DC

## 2019-06-30 MED ORDER — SODIUM CHLORIDE 0.9% FLUSH: 3.0000 mL | Freq: Once | INTRAVENOUS | Status: DC | PRN

## 2019-06-30 MED ORDER — ALTEPLASE 2 MG IJ SOLR: 2.0000 mg | Freq: Once | INTRAMUSCULAR | Status: DC | PRN

## 2019-06-30 MED ORDER — SODIUM CHLORIDE 0.9 % IV SOLN
INTRAVENOUS | Status: DC
  Administered 2019-06-30: 08:00:00 via INTRAVENOUS

## 2019-06-30 MED ORDER — SODIUM CHLORIDE 0.9 % IV SOLN
510.0000 mg | Freq: Once | INTRAVENOUS | Status: DC
  Filled 2019-06-30: qty 17

## 2019-06-30 MED ORDER — HEPARIN SOD (PORK) LOCK FLUSH 100 UNIT/ML IV SOLN: 250.0000 [IU] | Freq: Once | INTRAVENOUS | Status: DC | PRN

## 2019-06-30 MED ORDER — SODIUM CHLORIDE 0.9 % IV SOLN
510.0000 mg | Freq: Once | INTRAVENOUS | Status: AC
  Administered 2019-06-30: 510 mg via INTRAVENOUS
  Filled 2019-06-30: qty 17

## 2019-06-30 MED ORDER — HEPARIN SOD (PORK) LOCK FLUSH 100 UNIT/ML IV SOLN: 500.0000 [IU] | Freq: Once | INTRAVENOUS | Status: DC | PRN

## 2019-06-30 MED ORDER — SODIUM CHLORIDE 0.9% FLUSH: 10.0000 mL | Freq: Once | INTRAVENOUS | Status: DC | PRN

## 2019-06-30 MED ORDER — SODIUM CHLORIDE 0.9 % IV SOLN: 510.0000 mg | Freq: Once | INTRAVENOUS | Status: DC

## 2019-06-30 NOTE — Patient Instructions (Signed)
[facility] at [facility]  Discharge Instructions:   _______________________________________________________________  Thank you for choosing [facility] at [facility] to provide your oncology and hematology care.  To afford each patient quality time with our providers, please arrive at least 15 minutes before your scheduled appointment.  You need to re-schedule your appointment if you arrive 10 or more minutes late.  We strive to give you quality time with our providers, and arriving late affects you and other patients whose appointments are after yours.  Also, if you no show three or more times for appointments you may be dismissed from the clinic.  Again, thank you for choosing [facility] at [facility]. Our hope is that these requests will allow you access to exceptional care and in a timely manner. _______________________________________________________________  If you have questions after your visit, please contact our office at [phone] between the hours of 8:30 a.m. and 5:00 p.m. Voicemails left after 4:30 p.m. will not be returned until the following business day. _______________________________________________________________  For prescription refill requests, have your pharmacy contact our office. _______________________________________________________________  Recommendations made by the consultant and any test results will be sent to your referring physician. _______________________________________________________________ 

## 2019-06-30 NOTE — Progress Notes (Signed)
Pt presents today for Feraheme. VSS. Pt has no complaints of any pain or significant changes since the last visit. MAR reviewed.   Feraheme given today per MD orders. Tolerated infusion without adverse affects. Vital signs stable. No complaints at this time. Discharged from clinic ambulatory per pt request without waiting her 30 minutes. Pt states, " You all moved my appt up and I have another appt I need to go to and I was told I would be fine. "  F/U with [facility] as scheduled.

## 2019-07-01 ENCOUNTER — Other Ambulatory Visit (HOSPITAL_COMMUNITY): Payer: Medicare Other

## 2019-07-01 ENCOUNTER — Other Ambulatory Visit (HOSPITAL_COMMUNITY)
Admission: RE | Admit: 2019-07-01 | Discharge: 2019-07-01 | Disposition: A | Discharge status: Home / Self Care | Payer: Medicare Other | Source: Ambulatory Visit | Attending: Neurosurgery | Admitting: Neurosurgery

## 2019-07-01 DIAGNOSIS — Z20828 Contact with and (suspected) exposure to other viral communicable diseases: Secondary | ICD-10-CM | POA: Insufficient documentation

## 2019-07-01 DIAGNOSIS — Z01812 Encounter for preprocedural laboratory examination: Secondary | ICD-10-CM | POA: Insufficient documentation

## 2019-07-01 LAB — SARS CORONAVIRUS 2 (TAT 6-24 HRS): SARS Coronavirus 2: NEGATIVE

## 2019-07-03 NOTE — H&P (Signed)
Chief Complaint   aneurysm   HPI   HPI: Candice [name] is a 68 y.o. female with incidental discovery of a large left carotid aneurysm and small right carotid aneurysm during workup for hearing loss.  She presents today for diagnostic cerebral angiogram for further characterization of these aneurysms.  She is without any concerns.  Patient Active Problem List   Diagnosis Date Noted  . Normochromic normocytic anemia 02/21/2018  . Rectocele 08/14/2017  . Acquired vaginal enterocele 08/14/2017  . Guaiac positive stools 07/05/2017  . Iron deficiency anemia 10/14/2012    PMH: Past Medical History:  Diagnosis Date  . Anxiety   . Asthma   . Chronic constipation   . Diverticulosis of colon   . Dyspnea    sob with exertion  . GERD (gastroesophageal reflux disease)   . Hemorrhoids   . Herpes   . History of lumpectomy of right breast   . Hypertension   . Major depression   . Rectocele    W/ POSSIBLE CYSTOCELE    PSH: Past Surgical History:  Procedure Laterality Date  . BREAST SURGERY Right 01/17/2007   dr tim davis   excisional breast mass (papilloma)  . COLONOSCOPY  10/30/2012   Procedure: COLONOSCOPY;  Surgeon: Rogene Houston, MD;  [facility];  Service: Endoscopy;  Laterality: N/A;  200  . COLONOSCOPY N/A 07/24/2017   Procedure: COLONOSCOPY;  Surgeon: Rogene Houston, MD;  [facility];  Service: Endoscopy;  Laterality: N/A;  1230  . ESOPHAGOGASTRODUODENOSCOPY  10/30/2012   Procedure: ESOPHAGOGASTRODUODENOSCOPY (EGD);  Surgeon: Rogene Houston, MD;  [facility];  Service: Endoscopy;  Laterality: N/A;  . [place]  . NEEDLE-GUIDED EXCISION RIGHT BREAST MASS  07-13-2011  dr Margot Chimes  . RECTOCELE REPAIR N/A 08/14/2017   Procedure: POSTERIOR REPAIR (RECTOCELE) WITH ENTEROCELE REPAIR;  Surgeon: Cheri Fowler, MD;  Location: [place];  Service: Gynecology;  Laterality: N/A;  Outpatient extended recovery  .  VAGINAL HYSTERECTOMY     complete    (Not in a hospital admission)   SH: Social History   Tobacco Use  . Smoking status: Former Research scientist (life sciences)  . Smokeless tobacco: Never Used  . Tobacco comment: years ago  Substance Use Topics  . Alcohol use: Yes    Comment: occ wine  . Drug use: No    MEDS: Prior to Admission medications   Medication Sig Start Date End Date Taking? Authorizing Provider  acyclovir (ZOVIRAX) 400 MG tablet Take 400 mg by mouth daily as needed.     [provider]  Cholecalciferol (VITAMIN D) 2000 units CAPS Take 2,000 Units by mouth daily. Vit d 3    [provider]  citalopram (CELEXA) 20 MG tablet Take 20 mg by mouth every morning.     [provider]  clonazePAM (KLONOPIN) 0.5 MG tablet Take 0.25 mg by mouth at bedtime.    [provider]  cyclobenzaprine (FLEXERIL) 10 MG tablet TK 1 T PO  HS PRN P 01/09/19   [provider]  diclofenac (VOLTAREN) 75 MG EC tablet Take 75 mg by mouth 2 (two) times daily.    [provider]  fish oil-omega-3 fatty acids 1000 MG capsule Take 1 g by mouth daily.     [provider]  Fluticasone-Salmeterol (ADVAIR) 250-50 MCG/DOSE AEPB Inhale 1 puff into the lungs daily as needed (for shortness of breath).     [provider]  ibuprofen (ADVIL)  800 MG tablet TK 1 T PO TID 01/16/19   [provider]  ibuprofen (ADVIL,MOTRIN) 600 MG tablet Take 1 tablet (600 mg total) by mouth every 6 (six) hours as needed (mild pain). 08/15/17   Meisinger, Sherren Mocha, MD  linaclotide [facility]) 145 MCG CAPS capsule Take 145 mcg by mouth daily as needed (for constipation).     [provider]  loratadine (CLARITIN) 10 MG tablet Take 10 mg by mouth daily.    [provider]  meclizine (ANTIVERT) 25 MG tablet TK 1 T PO TID PRN 10/17/18   [provider]  Multiple Vitamins-Minerals (HAIR SKIN AND NAILS FORMULA PO) Take by mouth. 5000 mcg biotin 1 tab per day     [provider]  olmesartan-hydrochlorothiazide (BENICAR HCT) 20-12.5 MG per tablet Take 1 tablet by mouth daily.      [provider]  orlistat (ALLI) 60 MG capsule Take 60 mg by mouth 2 (two) times daily.    [provider]  OVER THE COUNTER MEDICATION Take 1 capsule by mouth daily. OTC Abs Cut Belly Fat    [provider]  OVER THE COUNTER MEDICATION Take 1 capsule by mouth 2 (two) times daily. Lipozene    [provider]  oxyCODONE-acetaminophen (PERCOCET/ROXICET) 5-325 MG tablet Take 1-2 tablets by mouth every 4 (four) hours as needed for severe pain. 08/15/17   Meisinger, Todd, MD  pantoprazole (PROTONIX) 40 MG tablet Take 40 mg by mouth daily. Heartburn    [provider]  PROAIR HFA 108 (90 BASE) MCG/ACT inhaler Take 2 puffs by mouth 4 (four) times daily as needed for wheezing or shortness of breath.  07/30/12   [provider]  [facility] injection  01/22/19   [provider]  UNABLE TO FIND Co q 10 100 mg liquid  q day    [provider]  UNABLE TO FIND Focus factor for brain one per day    [provider]    ALLERGY: Allergies  Allergen Reactions  . Codeine Nausea Only  . Sulfur Rash    Social History   Tobacco Use  . Smoking status: Former Research scientist (life sciences)  . Smokeless tobacco: Never Used  . Tobacco comment: years ago  Substance Use Topics  . Alcohol use: Yes    Comment: occ wine     No family history on file.   ROS   ROS  Exam   There were no vitals filed for this visit. General appearance: WDWN, NAD Eyes: No scleral injection Cardiovascular: Regular rate and rhythm without murmurs, rubs, gallops. No edema or variciosities. Distal pulses normal. Pulmonary: Effort normal, non-labored breathing Musculoskeletal:     Muscle tone upper extremities: Normal    Muscle tone lower extremities: Normal    Motor exam: Upper Extremities Deltoid Bicep Tricep Grip  Right 5/5 5/5 5/5 5/5  Left 5/5  5/5 5/5 5/5   Lower Extremity IP Quad PF DF EHL  Right 5/5 5/5 5/5 5/5 5/5  Left 5/5 5/5 5/5 5/5 5/5   Neurological Mental Status:    - Patient is awake, alert, oriented to person, place, month, year, and situation    - Patient is able to give a clear and coherent history.    - No signs of aphasia or neglect Cranial Nerves    - II: Visual Fields are full. PERRL    - III/IV/VI: EOMI without ptosis or diploplia.     - V: Facial sensation is grossly normal    - VII:  Facial movement is symmetric.     - VIII: hearing is intact to voice    - X: Uvula elevates symmetrically    - XI: Shoulder shrug is symmetric.    - XII: tongue is midline without atrophy or fasciculations.  Sensory: Sensation grossly intact to LT  Results - Imaging/Labs   No results found for this or any previous visit (from the past 48 hour(s)).  No results found.  IMAGING: CT angiogram of the head was reviewed.  This does demonstrate the presence of an approximately 10 millimeter left ophthalmic carotid aneurysm projecting superiorly.  There is also a smaller right carotid terminus aneurysm.  Impression/Plan   68 y.o. female   With incidental discovery of a large left ophthalmic aneurysm and a smaller right carotid terminus aneurysm during workup for hearing loss.  Given the size of the left-sided aneurysm, further workup is certainly warranted with the intent to treat.  We will proceed with diagnostic cerebral angiogram.   While in the office risks, benefits and alternatives were discussed.  Patient stated understanding and wished to proceed.  Ferne Reus, PA-C [facility] [employer]

## 2019-07-04 ENCOUNTER — Encounter (HOSPITAL_COMMUNITY): Payer: Self-pay | Admitting: Neurosurgery

## 2019-07-04 ENCOUNTER — Ambulatory Visit (HOSPITAL_COMMUNITY)
Admission: RE | Admit: 2019-07-04 | Discharge: 2019-07-04 | Disposition: A | Discharge status: Home / Self Care | Payer: Medicare Other | Source: Ambulatory Visit | Attending: Neurosurgery | Admitting: Neurosurgery

## 2019-07-04 ENCOUNTER — Other Ambulatory Visit: Payer: Self-pay | Admitting: Neurosurgery

## 2019-07-04 ENCOUNTER — Other Ambulatory Visit: Payer: Self-pay

## 2019-07-04 DIAGNOSIS — Z87891 Personal history of nicotine dependence: Secondary | ICD-10-CM | POA: Diagnosis not present

## 2019-07-04 DIAGNOSIS — F329 Major depressive disorder, single episode, unspecified: Secondary | ICD-10-CM | POA: Insufficient documentation

## 2019-07-04 DIAGNOSIS — I671 Cerebral aneurysm, nonruptured: Secondary | ICD-10-CM | POA: Diagnosis not present

## 2019-07-04 DIAGNOSIS — F419 Anxiety disorder, unspecified: Secondary | ICD-10-CM | POA: Insufficient documentation

## 2019-07-04 DIAGNOSIS — Z79899 Other long term (current) drug therapy: Secondary | ICD-10-CM | POA: Insufficient documentation

## 2019-07-04 DIAGNOSIS — Z791 Long term (current) use of non-steroidal anti-inflammatories (NSAID): Secondary | ICD-10-CM | POA: Diagnosis not present

## 2019-07-04 DIAGNOSIS — J45909 Unspecified asthma, uncomplicated: Secondary | ICD-10-CM | POA: Insufficient documentation

## 2019-07-04 DIAGNOSIS — K219 Gastro-esophageal reflux disease without esophagitis: Secondary | ICD-10-CM | POA: Insufficient documentation

## 2019-07-04 DIAGNOSIS — I10 Essential (primary) hypertension: Secondary | ICD-10-CM | POA: Diagnosis not present

## 2019-07-04 DIAGNOSIS — Z7951 Long term (current) use of inhaled steroids: Secondary | ICD-10-CM | POA: Diagnosis not present

## 2019-07-04 DIAGNOSIS — Z885 Allergy status to narcotic agent status: Secondary | ICD-10-CM | POA: Diagnosis not present

## 2019-07-04 HISTORY — PX: IR US GUIDE VASC ACCESS RIGHT: IMG2390

## 2019-07-04 HISTORY — PX: IR ANGIO INTRA EXTRACRAN SEL INTERNAL CAROTID UNI R MOD SED: IMG5362

## 2019-07-04 HISTORY — PX: IR ANGIO INTRA EXTRACRAN SEL COM CAROTID INNOMINATE UNI L MOD SED: IMG5358

## 2019-07-04 HISTORY — PX: IR ANGIO VERTEBRAL SEL VERTEBRAL UNI R MOD SED: IMG5368

## 2019-07-04 LAB — CBC WITH DIFFERENTIAL/PLATELET
Abs Immature Granulocytes: 0.03 10*3/uL (ref 0.00–0.07)
Basophils Absolute: 0 10*3/uL (ref 0.0–0.1)
Basophils Relative: 1 %
Eosinophils Absolute: 0.3 10*3/uL (ref 0.0–0.5)
Eosinophils Relative: 5 %
HCT: 37.8 % (ref 36.0–46.0)
Hemoglobin: 12.2 g/dL (ref 12.0–15.0)
Immature Granulocytes: 0 %
Lymphocytes Relative: 23 %
Lymphs Abs: 1.7 10*3/uL (ref 0.7–4.0)
MCH: 31.6 pg (ref 26.0–34.0)
MCHC: 32.3 g/dL (ref 30.0–36.0)
MCV: 97.9 fL (ref 80.0–100.0)
Monocytes Absolute: 0.7 10*3/uL (ref 0.1–1.0)
Monocytes Relative: 9 %
Neutro Abs: 4.7 10*3/uL (ref 1.7–7.7)
Neutrophils Relative %: 62 %
Platelets: 307 10*3/uL (ref 150–400)
RBC: 3.86 MIL/uL — ABNORMAL LOW (ref 3.87–5.11)
RDW: 15.8 % — ABNORMAL HIGH (ref 11.5–15.5)
WBC: 7.5 10*3/uL (ref 4.0–10.5)
nRBC: 0 % (ref 0.0–0.2)

## 2019-07-04 LAB — APTT: aPTT: 30 seconds (ref 24–36)

## 2019-07-04 LAB — BASIC METABOLIC PANEL
Anion gap: 10 (ref 5–15)
BUN: 21 mg/dL (ref 8–23)
CO2: 27 mmol/L (ref 22–32)
Calcium: 10 mg/dL (ref 8.9–10.3)
Chloride: 103 mmol/L (ref 98–111)
Creatinine, Ser: 1.29 mg/dL — ABNORMAL HIGH (ref 0.44–1.00)
GFR calc Af Amer: 49 mL/min — ABNORMAL LOW (ref >60)
GFR calc non Af Amer: 42 mL/min — ABNORMAL LOW (ref >60)
Glucose, Bld: 109 mg/dL — ABNORMAL HIGH (ref 70–99)
Potassium: 3.7 mmol/L (ref 3.5–5.1)
Sodium: 140 mmol/L (ref 135–145)

## 2019-07-04 LAB — PROTIME-INR
INR: 1 (ref 0.8–1.2)
Prothrombin Time: 13.5 seconds (ref 11.4–15.2)

## 2019-07-04 MED ORDER — FENTANYL CITRATE (PF) 100 MCG/2ML IJ SOLN
INTRAMUSCULAR | Status: AC
  Filled 2019-07-04: qty 2

## 2019-07-04 MED ORDER — NITROGLYCERIN 1 MG/10 ML FOR IR/CATH LAB
INTRA_ARTERIAL | Status: AC
  Filled 2019-07-04: qty 10

## 2019-07-04 MED ORDER — HEPARIN SODIUM (PORCINE) 1000 UNIT/ML IJ SOLN
INTRAMUSCULAR | Status: AC
  Filled 2019-07-04: qty 1

## 2019-07-04 MED ORDER — LIDOCAINE HCL 1 % IJ SOLN
INTRAMUSCULAR | Status: AC
  Filled 2019-07-04: qty 20

## 2019-07-04 MED ORDER — HEPARIN SODIUM (PORCINE) 1000 UNIT/ML IJ SOLN
INTRAMUSCULAR | Status: AC | PRN
  Administered 2019-07-04: 3000 [IU] via INTRAVENOUS

## 2019-07-04 MED ORDER — MIDAZOLAM HCL 2 MG/2ML IJ SOLN
INTRAMUSCULAR | Status: AC
  Filled 2019-07-04: qty 2

## 2019-07-04 MED ORDER — IOHEXOL 300 MG/ML  SOLN
150.0000 mL | Freq: Once | INTRAMUSCULAR | Status: AC | PRN
  Administered 2019-07-04: 60 mL via INTRA_ARTERIAL

## 2019-07-04 MED ORDER — MIDAZOLAM HCL 2 MG/2ML IJ SOLN
INTRAMUSCULAR | Status: AC | PRN
  Administered 2019-07-04: 1 mg via INTRAVENOUS

## 2019-07-04 MED ORDER — FENTANYL CITRATE (PF) 100 MCG/2ML IJ SOLN
INTRAMUSCULAR | Status: AC | PRN
  Administered 2019-07-04: 25 ug via INTRAVENOUS

## 2019-07-04 MED ORDER — VERAPAMIL HCL 2.5 MG/ML IV SOLN
INTRAVENOUS | Status: AC
  Filled 2019-07-04: qty 2

## 2019-07-04 MED ORDER — LIDOCAINE HCL 1 % IJ SOLN
INTRAMUSCULAR | Status: AC | PRN
  Administered 2019-07-04: 2 mL

## 2019-07-04 NOTE — Brief Op Note (Signed)
  NEUROSURGERY BRIEF OPERATIVE  NOTE   PREOP DX: Unruptured cerebral aneurysms  POSTOP DX: Same  PROCEDURE: Diagnostic cerebral angiogram  SURGEON: Dr. Consuella Lose, MD  ANESTHESIA: IV Sedation with Local  EBL: Minimal  SPECIMENS: None  COMPLICATIONS: None  CONDITION: Stable to recovery  FINDINGS (Full report in [facility]): 1. 9.4 x 6.25mm left ophthalmic aneurysm 2. 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 4. No other aneurysms, AVMs, or fistulas seen.

## 2019-07-04 NOTE — Progress Notes (Signed)
Client c/o fingertips on left hand being numb; no other complaints; Dr Kathyrn Sheriff notified and okay to d/c home and advise client to call Dr Cleotilde Neer office if any increase in numbness left hand and client voiced understanding

## 2019-07-04 NOTE — Discharge Instructions (Signed)
Radial Site Care  This sheet gives you information about how to care for yourself after your procedure. Your health care provider may also give you more specific instructions. If you have problems or questions, contact your health care provider. What can I expect after the procedure? After the procedure, it is common to have:  Bruising and tenderness at the catheter insertion area. Follow these instructions at home: Medicines  Take over-the-counter and prescription medicines only as told by your health care provider. Insertion site care  Follow instructions from your health care provider about how to take care of your insertion site. Make sure you: ? Wash your hands with soap and water before you change your bandage (dressing). If soap and water are not available, use hand sanitizer. ? Change your dressing as told by your health care provider. ? Leave stitches (sutures), skin glue, or adhesive strips in place. These skin closures may need to stay in place for 2 weeks or longer. If adhesive strip edges start to loosen and curl up, you may trim the loose edges. Do not remove adhesive strips completely unless your health care provider tells you to do that.  Check your insertion site every day for signs of infection. Check for: ? Redness, swelling, or pain. ? Fluid or blood. ? Pus or a bad smell. ? Warmth.  Do not take baths, swim, or use a hot tub until your health care provider approves.  You may shower 24-48 hours after the procedure, or as directed by your health care provider. ? Remove the dressing and gently wash the site with plain soap and water. ? Pat the area dry with a clean towel. ? Do not rub the site. That could cause bleeding.  Do not apply powder or lotion to the site. Activity   For 24 hours after the procedure, or as directed by your health care provider: ? Do not flex or bend the affected arm. ? Do not push or pull heavy objects with the affected arm. ? Do not  drive yourself home from the hospital or clinic. You may drive 24 hours after the procedure unless your health care provider tells you not to. ? Do not operate machinery or power tools.  Do not lift anything that is heavier than 10 lb (4.5 kg), or the limit that you are told, until your health care provider says that it is safe.  Ask your health care provider when it is okay to: ? Return to work or school. ? Resume usual physical activities or sports. ? Resume sexual activity. General instructions  If the catheter site starts to bleed, raise your arm and put firm pressure on the site. If the bleeding does not stop, get help right away. This is a medical emergency.  If you went home on the same day as your procedure, a responsible adult should be with you for the first 24 hours after you arrive home.  Keep all follow-up visits as told by your health care provider. This is important. Contact a health care provider if:  You have a fever.  You have redness, swelling, or yellow drainage around your insertion site. Get help right away if:  You have unusual pain at the radial site.  The catheter insertion area swells very fast.  The insertion area is bleeding, and the bleeding does not stop when you hold steady pressure on the area.  Your arm or hand becomes pale, cool, tingly, or numb. These symptoms may represent a serious problem   that is an emergency. Do not wait to see if the symptoms will go away. Get medical help right away. Call your local emergency services (911 in the [place] Do not drive yourself to the hospital. Summary  After the procedure, it is common to have bruising and tenderness at the site.  Follow instructions from your health care provider about how to take care of your radial site wound. Check the wound every day for signs of infection.  Do not lift anything that is heavier than 10 lb (4.5 kg), or the limit that you are told, until your health care provider says  that it is safe. This information is not intended to replace advice given to you by your health care provider. Make sure you discuss any questions you have with your health care provider. Document Released: 12/02/2010 Document Revised: 12/05/2017 Document Reviewed: 12/05/2017 Elsevier Patient Education  2020 Elsevier Inc.  Moderate Conscious Sedation, Adult, Care After These instructions provide you with information about caring for yourself after your procedure. Your health care provider may also give you more specific instructions. Your treatment has been planned according to current medical practices, but problems sometimes occur. Call your health care provider if you have any problems or questions after your procedure. What can I expect after the procedure? After your procedure, it is common:  To feel sleepy for several hours.  To feel clumsy and have poor balance for several hours.  To have poor judgment for several hours.  To vomit if you eat too soon. Follow these instructions at home: For at least 24 hours after the procedure:   Do not: ? Participate in activities where you could fall or become injured. ? Drive. ? Use heavy machinery. ? Drink alcohol. ? Take sleeping pills or medicines that cause drowsiness. ? Make important decisions or sign legal documents. ? Take care of children on your own.  Rest. Eating and drinking  Follow the diet recommended by your health care provider.  If you vomit: ? Drink water, juice, or soup when you can drink without vomiting. ? Make sure you have little or no nausea before eating solid foods. General instructions  Have a responsible adult stay with you until you are awake and alert.  Take over-the-counter and prescription medicines only as told by your health care provider.  If you smoke, do not smoke without supervision.  Keep all follow-up visits as told by your health care provider. This is important. Contact a health care  provider if:  You keep feeling nauseous or you keep vomiting.  You feel light-headed.  You develop a rash.  You have a fever. Get help right away if:  You have trouble breathing. This information is not intended to replace advice given to you by your health care provider. Make sure you discuss any questions you have with your health care provider. Document Released: 08/20/2013 Document Revised: 10/12/2017 Document Reviewed: 02/19/2016 Elsevier Patient Education  2020 Elsevier Inc.  

## 2019-07-07 ENCOUNTER — Inpatient Hospital Stay (HOSPITAL_COMMUNITY): Payer: Medicare Other

## 2019-07-07 ENCOUNTER — Other Ambulatory Visit (HOSPITAL_COMMUNITY): Payer: Medicare Other

## 2019-07-10 ENCOUNTER — Ambulatory Visit (HOSPITAL_COMMUNITY): Payer: Medicare Other | Admitting: Nurse Practitioner

## 2019-07-10 DIAGNOSIS — Z6834 Body mass index (BMI) 34.0-34.9, adult: Secondary | ICD-10-CM | POA: Diagnosis not present

## 2019-07-10 DIAGNOSIS — I671 Cerebral aneurysm, nonruptured: Secondary | ICD-10-CM | POA: Diagnosis not present

## 2019-07-14 ENCOUNTER — Other Ambulatory Visit: Payer: Self-pay

## 2019-07-14 ENCOUNTER — Inpatient Hospital Stay (HOSPITAL_COMMUNITY): Payer: Medicare Other

## 2019-07-14 VITALS — BP 115/68 | HR 87 | Temp 97.8°F | Resp 18

## 2019-07-14 DIAGNOSIS — J45909 Unspecified asthma, uncomplicated: Secondary | ICD-10-CM | POA: Diagnosis not present

## 2019-07-14 DIAGNOSIS — D508 Other iron deficiency anemias: Secondary | ICD-10-CM | POA: Diagnosis not present

## 2019-07-14 DIAGNOSIS — Z87891 Personal history of nicotine dependence: Secondary | ICD-10-CM | POA: Diagnosis not present

## 2019-07-14 DIAGNOSIS — F419 Anxiety disorder, unspecified: Secondary | ICD-10-CM | POA: Diagnosis not present

## 2019-07-14 DIAGNOSIS — Z79899 Other long term (current) drug therapy: Secondary | ICD-10-CM | POA: Diagnosis not present

## 2019-07-14 DIAGNOSIS — D649 Anemia, unspecified: Secondary | ICD-10-CM

## 2019-07-14 MED ORDER — SODIUM CHLORIDE 0.9 % IV SOLN
INTRAVENOUS | Status: DC
  Administered 2019-07-14: 14:00:00 via INTRAVENOUS

## 2019-07-14 MED ORDER — SODIUM CHLORIDE 0.9 % IV SOLN
510.0000 mg | Freq: Once | INTRAVENOUS | Status: AC
  Administered 2019-07-14: 510 mg via INTRAVENOUS
  Filled 2019-07-14: qty 510

## 2019-07-14 NOTE — Patient Instructions (Signed)
[facility]  Discharge Instructions:   _______________________________________________________________  Thank you for choosing [facility] to provide your oncology and hematology care.  To afford each patient quality time with our providers, please arrive at least 15 minutes before your scheduled appointment.  You need to re-schedule your appointment if you arrive 10 or more minutes late.  We strive to give you quality time with our providers, and arriving late affects you and other patients whose appointments are after yours.  Also, if you no show three or more times for appointments you may be dismissed from the clinic.  Again, thank you for choosing  [facility]. Our hope is that these requests will allow you access to exceptional care and in a timely manner. _______________________________________________________________  If you have questions after your visit, please contact our office at [phone] between the hours of 8:30 a.m. and 5:00 p.m. Voicemails left after 4:30 p.m. will not be returned until the following business day. _______________________________________________________________  For prescription refill requests, have your pharmacy contact our office. _______________________________________________________________  Recommendations made by the consultant and any test results will be sent to your referring physician. _______________________________________________________________ 

## 2019-07-14 NOTE — Progress Notes (Signed)
Pt presents today for Feraheme infusion. Pt has no complaints of any pain today. Pt complains of fatigue not relieved by rest. Pt missed her last Feraheme infusion. MAR reviewed. VSS.   Feraheme given today per MD orders. Tolerated infusion without adverse affects. Vital signs stable. No complaints at this time. Discharged from clinic ambulatory. F/U with [facility] as scheduled.

## 2019-07-15 DIAGNOSIS — Z6834 Body mass index (BMI) 34.0-34.9, adult: Secondary | ICD-10-CM | POA: Diagnosis not present

## 2019-07-15 DIAGNOSIS — I671 Cerebral aneurysm, nonruptured: Secondary | ICD-10-CM | POA: Diagnosis not present

## 2019-07-15 DIAGNOSIS — I10 Essential (primary) hypertension: Secondary | ICD-10-CM | POA: Diagnosis not present

## 2019-07-17 ENCOUNTER — Other Ambulatory Visit (HOSPITAL_COMMUNITY): Payer: Self-pay | Admitting: Neurosurgery

## 2019-07-17 ENCOUNTER — Other Ambulatory Visit: Payer: Self-pay | Admitting: Neurosurgery

## 2019-07-17 DIAGNOSIS — I671 Cerebral aneurysm, nonruptured: Secondary | ICD-10-CM

## 2019-07-23 DIAGNOSIS — M79604 Pain in right leg: Secondary | ICD-10-CM | POA: Diagnosis not present

## 2019-07-23 DIAGNOSIS — E782 Mixed hyperlipidemia: Secondary | ICD-10-CM | POA: Diagnosis not present

## 2019-07-23 DIAGNOSIS — Z0000 Encounter for general adult medical examination without abnormal findings: Secondary | ICD-10-CM | POA: Diagnosis not present

## 2019-07-23 DIAGNOSIS — J45909 Unspecified asthma, uncomplicated: Secondary | ICD-10-CM | POA: Diagnosis not present

## 2019-07-23 DIAGNOSIS — K210 Gastro-esophageal reflux disease with esophagitis: Secondary | ICD-10-CM | POA: Diagnosis not present

## 2019-07-23 DIAGNOSIS — Z6834 Body mass index (BMI) 34.0-34.9, adult: Secondary | ICD-10-CM | POA: Diagnosis not present

## 2019-07-23 DIAGNOSIS — I10 Essential (primary) hypertension: Secondary | ICD-10-CM | POA: Diagnosis not present

## 2019-07-23 DIAGNOSIS — Z23 Encounter for immunization: Secondary | ICD-10-CM | POA: Diagnosis not present

## 2019-07-23 DIAGNOSIS — R739 Hyperglycemia, unspecified: Secondary | ICD-10-CM | POA: Diagnosis not present

## 2019-07-23 DIAGNOSIS — F411 Generalized anxiety disorder: Secondary | ICD-10-CM | POA: Diagnosis not present

## 2019-07-23 DIAGNOSIS — D508 Other iron deficiency anemias: Secondary | ICD-10-CM | POA: Diagnosis not present

## 2019-07-23 DIAGNOSIS — F331 Major depressive disorder, recurrent, moderate: Secondary | ICD-10-CM | POA: Diagnosis not present

## 2019-07-24 ENCOUNTER — Other Ambulatory Visit: Payer: Self-pay | Admitting: Neurosurgery

## 2019-07-25 ENCOUNTER — Other Ambulatory Visit (HOSPITAL_COMMUNITY)
Admission: RE | Admit: 2019-07-25 | Discharge: 2019-07-25 | Disposition: A | Discharge status: Home / Self Care | Payer: Medicare Other | Source: Ambulatory Visit | Attending: Neurosurgery | Admitting: Neurosurgery

## 2019-07-25 ENCOUNTER — Other Ambulatory Visit: Payer: Self-pay

## 2019-07-25 DIAGNOSIS — Z20828 Contact with and (suspected) exposure to other viral communicable diseases: Secondary | ICD-10-CM | POA: Insufficient documentation

## 2019-07-25 DIAGNOSIS — Z01812 Encounter for preprocedural laboratory examination: Secondary | ICD-10-CM | POA: Diagnosis not present

## 2019-07-25 DIAGNOSIS — Z23 Encounter for immunization: Secondary | ICD-10-CM | POA: Diagnosis not present

## 2019-07-25 LAB — SARS CORONAVIRUS 2 (TAT 6-24 HRS): SARS Coronavirus 2: NEGATIVE

## 2019-07-28 ENCOUNTER — Encounter (HOSPITAL_COMMUNITY): Payer: Self-pay

## 2019-07-28 ENCOUNTER — Other Ambulatory Visit: Payer: Self-pay

## 2019-07-28 NOTE — Progress Notes (Signed)
Pre-op call placed.  Spoke w/patient.  Reviewed meds/history/pre-op instructions. Patient staets her surgeon told her to continue taking ASA and Plavix up to surgery date.  Instructed to bring Pro-Air with her to the hospital.  COVID test 07/25/19 is negative and patient states she has followed self quarantine rules and guidelines.  Patient instructed to arrive at Admitting tomorrow morning at 0600.  Patient requests new information re: advanced directives tomorrow and wants to ensure physician calls her sister, Antony Blackbird, after surgery.
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
## 2019-07-28 NOTE — Anesthesia Preprocedure Evaluation (Addendum)
Anesthesia Evaluation  Patient identified by MRN, date of birth, ID band Patient awake    Reviewed: Allergy & Precautions, NPO status , Patient's Chart, lab work & pertinent test results  Airway Mallampati: II  TM Distance: >3 FB     Dental  (+) Dental Advisory Given   Pulmonary asthma , former smoker,    breath sounds clear to auscultation       Cardiovascular hypertension, Pt. on medications  Rhythm:Regular Rate:Normal     Neuro/Psych Cerebral aneurysm    GI/Hepatic Neg liver ROS, GERD  ,  Endo/Other  negative endocrine ROS  Renal/GU negative Renal ROS     Musculoskeletal   Abdominal   Peds  Hematology negative hematology ROS (+)   Anesthesia Other Findings   Reproductive/Obstetrics                            Lab Results  Component Value Date   WBC 7.5 07/04/2019   HGB 12.2 07/04/2019   HCT 37.8 07/04/2019   MCV 97.9 07/04/2019   PLT 307 07/04/2019   Lab Results  Component Value Date   CREATININE 1.29 (H) 07/04/2019   BUN 21 07/04/2019   NA 140 07/04/2019   K 3.7 07/04/2019   CL 103 07/04/2019   CO2 27 07/04/2019    Anesthesia Physical Anesthesia Plan  ASA: III  Anesthesia Plan: General   Post-op Pain Management:    Induction: Intravenous  PONV Risk Score and Plan: 3 and Dexamethasone, Ondansetron and Treatment may vary due to age or medical condition  Airway Management Planned: Oral ETT  Additional Equipment: Arterial line  Intra-op Plan:   Post-operative Plan: Extubation in OR and Possible Post-op intubation/ventilation  Informed Consent: I have reviewed the patients History and Physical, chart, labs and discussed the procedure including the risks, benefits and alternatives for the proposed anesthesia with the patient or authorized representative who has indicated his/her understanding and acceptance.     Dental advisory given  Plan Discussed with:  CRNA  Anesthesia Plan Comments:        Anesthesia Quick Evaluation

## 2019-07-29 ENCOUNTER — Observation Stay (HOSPITAL_COMMUNITY)
Admission: RE | Admit: 2019-07-29 | Discharge: 2019-07-30 | Disposition: A | Discharge status: Home / Self Care | Payer: Medicare Other | Attending: Neurosurgery | Admitting: Neurosurgery

## 2019-07-29 ENCOUNTER — Inpatient Hospital Stay (HOSPITAL_COMMUNITY): Payer: Medicare Other | Admitting: Anesthesiology

## 2019-07-29 ENCOUNTER — Other Ambulatory Visit: Payer: Self-pay

## 2019-07-29 ENCOUNTER — Ambulatory Visit (HOSPITAL_COMMUNITY)
Admission: RE | Admit: 2019-07-29 | Discharge: 2019-07-29 | Disposition: A | Discharge status: Home / Self Care | Payer: Medicare Other | Source: Ambulatory Visit | Attending: Neurosurgery | Admitting: Neurosurgery

## 2019-07-29 ENCOUNTER — Encounter (HOSPITAL_COMMUNITY): Payer: Self-pay | Admitting: *Deleted

## 2019-07-29 ENCOUNTER — Encounter (HOSPITAL_COMMUNITY)
Admission: RE | Disposition: A | Discharge status: Home / Self Care | Payer: Self-pay | Source: Home / Self Care | Attending: Neurosurgery

## 2019-07-29 DIAGNOSIS — I720 Aneurysm of carotid artery: Secondary | ICD-10-CM | POA: Diagnosis not present

## 2019-07-29 DIAGNOSIS — F329 Major depressive disorder, single episode, unspecified: Secondary | ICD-10-CM | POA: Insufficient documentation

## 2019-07-29 DIAGNOSIS — I10 Essential (primary) hypertension: Secondary | ICD-10-CM | POA: Diagnosis not present

## 2019-07-29 DIAGNOSIS — K219 Gastro-esophageal reflux disease without esophagitis: Secondary | ICD-10-CM | POA: Diagnosis not present

## 2019-07-29 DIAGNOSIS — Z79899 Other long term (current) drug therapy: Secondary | ICD-10-CM | POA: Insufficient documentation

## 2019-07-29 DIAGNOSIS — Z87891 Personal history of nicotine dependence: Secondary | ICD-10-CM | POA: Diagnosis not present

## 2019-07-29 DIAGNOSIS — J45909 Unspecified asthma, uncomplicated: Secondary | ICD-10-CM | POA: Diagnosis not present

## 2019-07-29 DIAGNOSIS — Z7982 Long term (current) use of aspirin: Secondary | ICD-10-CM | POA: Diagnosis not present

## 2019-07-29 DIAGNOSIS — F419 Anxiety disorder, unspecified: Secondary | ICD-10-CM | POA: Insufficient documentation

## 2019-07-29 DIAGNOSIS — Z7951 Long term (current) use of inhaled steroids: Secondary | ICD-10-CM | POA: Diagnosis not present

## 2019-07-29 DIAGNOSIS — I671 Cerebral aneurysm, nonruptured: Secondary | ICD-10-CM | POA: Diagnosis not present

## 2019-07-29 DIAGNOSIS — Z882 Allergy status to sulfonamides status: Secondary | ICD-10-CM | POA: Diagnosis not present

## 2019-07-29 DIAGNOSIS — K5909 Other constipation: Secondary | ICD-10-CM | POA: Insufficient documentation

## 2019-07-29 DIAGNOSIS — D649 Anemia, unspecified: Secondary | ICD-10-CM | POA: Diagnosis not present

## 2019-07-29 DIAGNOSIS — D500 Iron deficiency anemia secondary to blood loss (chronic): Secondary | ICD-10-CM | POA: Diagnosis not present

## 2019-07-29 DIAGNOSIS — Z885 Allergy status to narcotic agent status: Secondary | ICD-10-CM | POA: Insufficient documentation

## 2019-07-29 DIAGNOSIS — Z7902 Long term (current) use of antithrombotics/antiplatelets: Secondary | ICD-10-CM | POA: Insufficient documentation

## 2019-07-29 DIAGNOSIS — M1990 Unspecified osteoarthritis, unspecified site: Secondary | ICD-10-CM | POA: Insufficient documentation

## 2019-07-29 HISTORY — PX: IR ANGIO INTRA EXTRACRAN SEL INTERNAL CAROTID UNI L MOD SED: IMG5361

## 2019-07-29 HISTORY — DX: Unspecified osteoarthritis, unspecified site: M19.90

## 2019-07-29 HISTORY — PX: IR TRANSCATH/EMBOLIZ: IMG695

## 2019-07-29 HISTORY — DX: Anemia, unspecified: D64.9

## 2019-07-29 HISTORY — PX: RADIOLOGY WITH ANESTHESIA: SHX6223

## 2019-07-29 HISTORY — PX: IR ANGIOGRAM FOLLOW UP STUDY: IMG697

## 2019-07-29 LAB — PROTIME-INR
INR: 1 (ref 0.8–1.2)
Prothrombin Time: 13.4 seconds (ref 11.4–15.2)

## 2019-07-29 LAB — CBC WITH DIFFERENTIAL/PLATELET
Abs Immature Granulocytes: 0.01 10*3/uL (ref 0.00–0.07)
Basophils Absolute: 0 10*3/uL (ref 0.0–0.1)
Basophils Relative: 1 %
Eosinophils Absolute: 0.3 10*3/uL (ref 0.0–0.5)
Eosinophils Relative: 5 %
HCT: 30.1 % — ABNORMAL LOW (ref 36.0–46.0)
Hemoglobin: 9.9 g/dL — ABNORMAL LOW (ref 12.0–15.0)
Immature Granulocytes: 0 %
Lymphocytes Relative: 23 %
Lymphs Abs: 1.6 10*3/uL (ref 0.7–4.0)
MCH: 32.9 pg (ref 26.0–34.0)
MCHC: 32.9 g/dL (ref 30.0–36.0)
MCV: 100 fL (ref 80.0–100.0)
Monocytes Absolute: 0.5 10*3/uL (ref 0.1–1.0)
Monocytes Relative: 8 %
Neutro Abs: 4.3 10*3/uL (ref 1.7–7.7)
Neutrophils Relative %: 63 %
Platelets: 255 10*3/uL (ref 150–400)
RBC: 3.01 MIL/uL — ABNORMAL LOW (ref 3.87–5.11)
RDW: 15.9 % — ABNORMAL HIGH (ref 11.5–15.5)
WBC: 6.8 10*3/uL (ref 4.0–10.5)
nRBC: 0 % (ref 0.0–0.2)

## 2019-07-29 LAB — URINALYSIS, ROUTINE W REFLEX MICROSCOPIC
Bilirubin Urine: NEGATIVE
Glucose, UA: NEGATIVE mg/dL
Hgb urine dipstick: NEGATIVE
Ketones, ur: NEGATIVE mg/dL
Nitrite: NEGATIVE
Protein, ur: NEGATIVE mg/dL
Specific Gravity, Urine: 1.018 (ref 1.005–1.030)
pH: 5 (ref 5.0–8.0)

## 2019-07-29 LAB — BASIC METABOLIC PANEL
Anion gap: 12 (ref 5–15)
BUN: 25 mg/dL — ABNORMAL HIGH (ref 8–23)
CO2: 24 mmol/L (ref 22–32)
Calcium: 9.5 mg/dL (ref 8.9–10.3)
Chloride: 102 mmol/L (ref 98–111)
Creatinine, Ser: 1.19 mg/dL — ABNORMAL HIGH (ref 0.44–1.00)
GFR calc Af Amer: 54 mL/min — ABNORMAL LOW (ref >60)
GFR calc non Af Amer: 46 mL/min — ABNORMAL LOW (ref >60)
Glucose, Bld: 112 mg/dL — ABNORMAL HIGH (ref 70–99)
Potassium: 3.4 mmol/L — ABNORMAL LOW (ref 3.5–5.1)
Sodium: 138 mmol/L (ref 135–145)

## 2019-07-29 LAB — APTT: aPTT: 29 seconds (ref 24–36)

## 2019-07-29 LAB — POCT ACTIVATED CLOTTING TIME: Activated Clotting Time: 235 seconds

## 2019-07-29 SURGERY — IR WITH ANESTHESIA
Anesthesia: General

## 2019-07-29 MED ORDER — VITAMIN B-12 2500 MCG SL SUBL: 2500.0000 ug | SUBLINGUAL_TABLET | Freq: Every day | SUBLINGUAL | Status: DC

## 2019-07-29 MED ORDER — ONDANSETRON HCL 4 MG/2ML IJ SOLN
INTRAMUSCULAR | Status: DC | PRN
  Administered 2019-07-29: 4 mg via INTRAVENOUS

## 2019-07-29 MED ORDER — PHENOL 1.4 % MT LIQD
1.0000 | OROMUCOSAL | Status: DC | PRN
  Administered 2019-07-29 (×2): 1 via OROMUCOSAL
  Filled 2019-07-29: qty 177

## 2019-07-29 MED ORDER — CHLORHEXIDINE GLUCONATE CLOTH 2 % EX PADS: 6.0000 | MEDICATED_PAD | Freq: Once | CUTANEOUS | Status: DC

## 2019-07-29 MED ORDER — DEXAMETHASONE SODIUM PHOSPHATE 10 MG/ML IJ SOLN
INTRAMUSCULAR | Status: AC
  Filled 2019-07-29: qty 1

## 2019-07-29 MED ORDER — PROPOFOL 10 MG/ML IV BOLUS
INTRAVENOUS | Status: DC | PRN
  Administered 2019-07-29: 80 mg via INTRAVENOUS
  Administered 2019-07-29: 120 mg via INTRAVENOUS

## 2019-07-29 MED ORDER — VERAPAMIL HCL 2.5 MG/ML IV SOLN
INTRAVENOUS | Status: AC
  Filled 2019-07-29: qty 2

## 2019-07-29 MED ORDER — ONDANSETRON HCL 4 MG/2ML IJ SOLN: 4.0000 mg | Freq: Once | INTRAMUSCULAR | Status: DC | PRN

## 2019-07-29 MED ORDER — POLYVINYL ALCOHOL 1.4 % OP SOLN
1.0000 [drp] | OPHTHALMIC | Status: DC | PRN
  Administered 2019-07-29: 1 [drp] via OPHTHALMIC
  Filled 2019-07-29: qty 15

## 2019-07-29 MED ORDER — ORAL CARE MOUTH RINSE
15.0000 mL | Freq: Two times a day (BID) | OROMUCOSAL | Status: DC
  Administered 2019-07-29 – 2019-07-30 (×2): 15 mL via OROMUCOSAL

## 2019-07-29 MED ORDER — FENTANYL CITRATE (PF) 250 MCG/5ML IJ SOLN
INTRAMUSCULAR | Status: DC | PRN
  Administered 2019-07-29 (×3): 50 ug via INTRAVENOUS

## 2019-07-29 MED ORDER — IRBESARTAN 150 MG PO TABS
150.0000 mg | ORAL_TABLET | Freq: Every day | ORAL | Status: DC
  Administered 2019-07-29 – 2019-07-30 (×2): 150 mg via ORAL
  Filled 2019-07-29 (×2): qty 1

## 2019-07-29 MED ORDER — HYDROCODONE-ACETAMINOPHEN 5-325 MG PO TABS
1.0000 | ORAL_TABLET | ORAL | Status: DC | PRN
  Administered 2019-07-29 – 2019-07-30 (×4): 1 via ORAL
  Filled 2019-07-29 (×4): qty 1

## 2019-07-29 MED ORDER — IOHEXOL 300 MG/ML  SOLN
150.0000 mL | Freq: Once | INTRAMUSCULAR | Status: AC | PRN
  Administered 2019-07-29: 80 mL via INTRA_ARTERIAL

## 2019-07-29 MED ORDER — SODIUM CHLORIDE 0.9 % IV SOLN
INTRAVENOUS | Status: DC | PRN
  Administered 2019-07-29: 100 ug/min via INTRAVENOUS

## 2019-07-29 MED ORDER — MENTHOL 3 MG MT LOZG
1.0000 | LOZENGE | OROMUCOSAL | Status: DC | PRN
  Administered 2019-07-29 – 2019-07-30 (×3): 3 mg via ORAL
  Filled 2019-07-29: qty 9

## 2019-07-29 MED ORDER — HYDROCHLOROTHIAZIDE 12.5 MG PO CAPS
12.5000 mg | ORAL_CAPSULE | Freq: Every day | ORAL | Status: DC
  Administered 2019-07-29 – 2019-07-30 (×2): 12.5 mg via ORAL
  Filled 2019-07-29 (×2): qty 1

## 2019-07-29 MED ORDER — FENTANYL CITRATE (PF) 250 MCG/5ML IJ SOLN
INTRAMUSCULAR | Status: AC
  Filled 2019-07-29: qty 5

## 2019-07-29 MED ORDER — EPTIFIBATIDE 20 MG/10ML IV SOLN
INTRAVENOUS | Status: AC | PRN
  Administered 2019-07-29: 2 mg via INTRAVENOUS

## 2019-07-29 MED ORDER — OLMESARTAN MEDOXOMIL-HCTZ 20-12.5 MG PO TABS: 1.0000 | ORAL_TABLET | Freq: Every day | ORAL | Status: DC

## 2019-07-29 MED ORDER — CEFAZOLIN SODIUM-DEXTROSE 2-3 GM-%(50ML) IV SOLR
INTRAVENOUS | Status: DC | PRN
  Administered 2019-07-29: 2 g via INTRAVENOUS

## 2019-07-29 MED ORDER — CEFAZOLIN SODIUM-DEXTROSE 2-4 GM/100ML-% IV SOLN: 2.0000 g | INTRAVENOUS | Status: DC

## 2019-07-29 MED ORDER — ASPIRIN 325 MG PO TABS
325.0000 mg | ORAL_TABLET | Freq: Every day | ORAL | Status: DC
  Administered 2019-07-30: 10:00:00 325 mg via ORAL
  Filled 2019-07-29: qty 1

## 2019-07-29 MED ORDER — ONDANSETRON HCL 4 MG/2ML IJ SOLN
INTRAMUSCULAR | Status: AC
  Filled 2019-07-29: qty 2

## 2019-07-29 MED ORDER — FENTANYL CITRATE (PF) 100 MCG/2ML IJ SOLN: 25.0000 ug | INTRAMUSCULAR | Status: DC | PRN

## 2019-07-29 MED ORDER — SODIUM CHLORIDE (PF) 0.9 % IJ SOLN
INTRAVENOUS | Status: AC | PRN
  Administered 2019-07-29: 50 ug via INTRA_ARTERIAL
  Administered 2019-07-29: 75 ug via INTRA_ARTERIAL
  Administered 2019-07-29: 50 ug via INTRA_ARTERIAL

## 2019-07-29 MED ORDER — CHLORHEXIDINE GLUCONATE CLOTH 2 % EX PADS
6.0000 | MEDICATED_PAD | Freq: Every day | CUTANEOUS | Status: DC
  Administered 2019-07-30: 6 via TOPICAL

## 2019-07-29 MED ORDER — ALBUTEROL SULFATE (2.5 MG/3ML) 0.083% IN NEBU: 3.0000 mL | INHALATION_SOLUTION | Freq: Four times a day (QID) | RESPIRATORY_TRACT | Status: DC | PRN

## 2019-07-29 MED ORDER — VERAPAMIL HCL 2.5 MG/ML IV SOLN
INTRAVENOUS | Status: AC | PRN
  Administered 2019-07-29: 2.5 mg via INTRA_ARTERIAL

## 2019-07-29 MED ORDER — CLONAZEPAM 0.25 MG PO TBDP
0.2500 mg | ORAL_TABLET | Freq: Every day | ORAL | Status: DC
  Administered 2019-07-29: 0.25 mg via ORAL
  Filled 2019-07-29: qty 1

## 2019-07-29 MED ORDER — LIDOCAINE 2% (20 MG/ML) 5 ML SYRINGE
INTRAMUSCULAR | Status: DC | PRN
  Administered 2019-07-29: 80 mg via INTRAVENOUS

## 2019-07-29 MED ORDER — FLUTICASONE FUROATE-VILANTEROL 200-25 MCG/INH IN AEPB
1.0000 | INHALATION_SPRAY | Freq: Every day | RESPIRATORY_TRACT | Status: DC
  Administered 2019-07-30: 1 via RESPIRATORY_TRACT
  Filled 2019-07-29: qty 28

## 2019-07-29 MED ORDER — ROCURONIUM BROMIDE 10 MG/ML (PF) SYRINGE
PREFILLED_SYRINGE | INTRAVENOUS | Status: AC
  Filled 2019-07-29: qty 10

## 2019-07-29 MED ORDER — DEXMEDETOMIDINE HCL 200 MCG/2ML IV SOLN
INTRAVENOUS | Status: DC | PRN
  Administered 2019-07-29: 12 ug via INTRAVENOUS

## 2019-07-29 MED ORDER — ALBUMIN HUMAN 5 % IV SOLN
INTRAVENOUS | Status: AC
  Filled 2019-07-29: qty 250

## 2019-07-29 MED ORDER — ONDANSETRON HCL 4 MG/2ML IJ SOLN: 4.0000 mg | INTRAMUSCULAR | Status: DC | PRN

## 2019-07-29 MED ORDER — ACYCLOVIR 400 MG PO TABS
400.0000 mg | ORAL_TABLET | Freq: Every day | ORAL | Status: DC
  Administered 2019-07-30: 400 mg via ORAL
  Filled 2019-07-29: qty 1

## 2019-07-29 MED ORDER — ROCURONIUM BROMIDE 10 MG/ML (PF) SYRINGE
PREFILLED_SYRINGE | INTRAVENOUS | Status: DC | PRN
  Administered 2019-07-29 (×2): 15 mg via INTRAVENOUS
  Administered 2019-07-29: 40 mg via INTRAVENOUS

## 2019-07-29 MED ORDER — PANTOPRAZOLE SODIUM 40 MG PO TBEC
40.0000 mg | DELAYED_RELEASE_TABLET | Freq: Every day | ORAL | Status: DC
  Administered 2019-07-29 – 2019-07-30 (×2): 40 mg via ORAL
  Filled 2019-07-29 (×2): qty 1

## 2019-07-29 MED ORDER — EPHEDRINE 5 MG/ML INJ
INTRAVENOUS | Status: AC
  Filled 2019-07-29: qty 10

## 2019-07-29 MED ORDER — LORATADINE 10 MG PO TABS
10.0000 mg | ORAL_TABLET | Freq: Every day | ORAL | Status: DC
  Administered 2019-07-30: 10 mg via ORAL
  Filled 2019-07-29: qty 1

## 2019-07-29 MED ORDER — ORLISTAT 60 MG PO CAPS: 60.0000 mg | ORAL_CAPSULE | Freq: Three times a day (TID) | ORAL | Status: DC

## 2019-07-29 MED ORDER — HEPARIN SODIUM (PORCINE) 1000 UNIT/ML IJ SOLN
INTRAMUSCULAR | Status: DC | PRN
  Administered 2019-07-29: 1000 [IU] via INTRAVENOUS
  Administered 2019-07-29: 5000 [IU] via INTRAVENOUS

## 2019-07-29 MED ORDER — ONDANSETRON HCL 4 MG PO TABS: 4.0000 mg | ORAL_TABLET | ORAL | Status: DC | PRN

## 2019-07-29 MED ORDER — MIDAZOLAM HCL 5 MG/5ML IJ SOLN
INTRAMUSCULAR | Status: DC | PRN
  Administered 2019-07-29: 1 mg via INTRAVENOUS

## 2019-07-29 MED ORDER — SODIUM CHLORIDE 0.9 % IV SOLN: INTRAVENOUS | Status: DC

## 2019-07-29 MED ORDER — CITALOPRAM HYDROBROMIDE 20 MG PO TABS
20.0000 mg | ORAL_TABLET | Freq: Every day | ORAL | Status: DC
  Administered 2019-07-30: 20 mg via ORAL
  Filled 2019-07-29: qty 1
  Filled 2019-07-29: qty 2

## 2019-07-29 MED ORDER — MORPHINE SULFATE (PF) 2 MG/ML IV SOLN
1.0000 mg | INTRAVENOUS | Status: DC | PRN
  Administered 2019-07-29 (×2): 1 mg via INTRAVENOUS
  Administered 2019-07-30 (×2): 2 mg via INTRAVENOUS
  Filled 2019-07-29 (×4): qty 1

## 2019-07-29 MED ORDER — SUGAMMADEX SODIUM 500 MG/5ML IV SOLN
INTRAVENOUS | Status: DC | PRN
  Administered 2019-07-29: 400 mg via INTRAVENOUS

## 2019-07-29 MED ORDER — CYCLOBENZAPRINE HCL 10 MG PO TABS: 10.0000 mg | ORAL_TABLET | Freq: Every evening | ORAL | Status: DC | PRN

## 2019-07-29 MED ORDER — ESMOLOL HCL 100 MG/10ML IV SOLN
INTRAVENOUS | Status: DC | PRN
  Administered 2019-07-29 (×2): 20 mg via INTRAVENOUS

## 2019-07-29 MED ORDER — VITAMIN D 25 MCG (1000 UNIT) PO TABS
500.0000 [IU] | ORAL_TABLET | Freq: Every day | ORAL | Status: DC
  Administered 2019-07-29 – 2019-07-30 (×2): 500 [IU] via ORAL
  Filled 2019-07-29 (×2): qty 1

## 2019-07-29 MED ORDER — SUCCINYLCHOLINE CHLORIDE 200 MG/10ML IV SOSY
PREFILLED_SYRINGE | INTRAVENOUS | Status: AC
  Filled 2019-07-29: qty 10

## 2019-07-29 MED ORDER — NITROGLYCERIN 1 MG/10 ML FOR IR/CATH LAB
INTRA_ARTERIAL | Status: AC
  Filled 2019-07-29: qty 10

## 2019-07-29 MED ORDER — EPHEDRINE SULFATE 50 MG/ML IJ SOLN
INTRAMUSCULAR | Status: DC | PRN
  Administered 2019-07-29 (×2): 5 mg via INTRAVENOUS

## 2019-07-29 MED ORDER — CEFAZOLIN SODIUM-DEXTROSE 2-4 GM/100ML-% IV SOLN
2.0000 g | INTRAVENOUS | Status: DC
  Filled 2019-07-29: qty 100

## 2019-07-29 MED ORDER — ALBUMIN HUMAN 5 % IV SOLN
INTRAVENOUS | Status: DC | PRN
  Administered 2019-07-29: 10:00:00 via INTRAVENOUS

## 2019-07-29 MED ORDER — PHENYLEPHRINE 40 MCG/ML (10ML) SYRINGE FOR IV PUSH (FOR BLOOD PRESSURE SUPPORT)
PREFILLED_SYRINGE | INTRAVENOUS | Status: DC | PRN
  Administered 2019-07-29 (×2): 80 ug via INTRAVENOUS
  Administered 2019-07-29: 60 ug via INTRAVENOUS
  Administered 2019-07-29 (×2): 80 ug via INTRAVENOUS

## 2019-07-29 MED ORDER — LIDOCAINE 2% (20 MG/ML) 5 ML SYRINGE
INTRAMUSCULAR | Status: AC
  Filled 2019-07-29: qty 5

## 2019-07-29 MED ORDER — CLOPIDOGREL BISULFATE 75 MG PO TABS
75.0000 mg | ORAL_TABLET | Freq: Every day | ORAL | Status: DC
  Administered 2019-07-30: 10:00:00 75 mg via ORAL
  Filled 2019-07-29: qty 1

## 2019-07-29 MED ORDER — MAGNESIUM 200 MG PO TABS: 70.0000 mg | ORAL_TABLET | Freq: Every day | ORAL | Status: DC

## 2019-07-29 MED ORDER — EPTIFIBATIDE 20 MG/10ML IV SOLN
INTRAVENOUS | Status: AC
  Filled 2019-07-29: qty 10

## 2019-07-29 MED ORDER — LABETALOL HCL 5 MG/ML IV SOLN: 10.0000 mg | INTRAVENOUS | Status: DC | PRN

## 2019-07-29 MED ORDER — LINACLOTIDE 145 MCG PO CAPS
145.0000 ug | ORAL_CAPSULE | Freq: Every day | ORAL | Status: DC | PRN
  Filled 2019-07-29: qty 1

## 2019-07-29 MED ORDER — PHENYLEPHRINE 40 MCG/ML (10ML) SYRINGE FOR IV PUSH (FOR BLOOD PRESSURE SUPPORT)
PREFILLED_SYRINGE | INTRAVENOUS | Status: AC
  Filled 2019-07-29: qty 10

## 2019-07-29 MED ORDER — SUCCINYLCHOLINE CHLORIDE 20 MG/ML IJ SOLN
INTRAMUSCULAR | Status: DC | PRN
  Administered 2019-07-29: 40 mg via INTRAVENOUS

## 2019-07-29 MED ORDER — DEXAMETHASONE SODIUM PHOSPHATE 10 MG/ML IJ SOLN
INTRAMUSCULAR | Status: DC | PRN
  Administered 2019-07-29: 4 mg via INTRAVENOUS

## 2019-07-29 MED ORDER — ALBUMIN HUMAN 5 % IV SOLN
12.5000 g | Freq: Once | INTRAVENOUS | Status: AC
  Administered 2019-07-29: 12.5 g via INTRAVENOUS

## 2019-07-29 MED ORDER — PROPOFOL 10 MG/ML IV BOLUS
INTRAVENOUS | Status: AC
  Filled 2019-07-29: qty 20

## 2019-07-29 NOTE — Progress Notes (Signed)
Pt belongings include: 2 bonnets 2 pairs underwear 1 bra 1 pair jeans 1 top 1 pair black glasses 1 pair shoes 1 cell phone 1 cell phone charger Blistex Visine drops  Verified w/ Merrily Pew, RN

## 2019-07-29 NOTE — Anesthesia Procedure Notes (Signed)
Arterial Line Insertion Start/End9/15/2020 7:30 AM, 07/29/2019 8:15 AM Performed by: Mariea Clonts, CRNA, CRNA  Patient location: Pre-op. Preanesthetic checklist: patient identified, IV checked, site marked, risks and benefits discussed, surgical consent, monitors and equipment checked, pre-op evaluation and timeout performed Lidocaine 1% used for infiltration and patient sedated radial was placed Catheter size: 20 G Hand hygiene performed , maximum sterile barriers used  and Seldinger technique used Allen's test indicative of satisfactory collateral circulation Attempts: 2 Procedure performed without using ultrasound guided technique. Ultrasound Notes:anatomy identified, needle tip was noted to be adjacent to the nerve/plexus identified and no ultrasound evidence of intravascular and/or intraneural injection Following insertion, Biopatch and dressing applied. Post procedure assessment: normal  Patient tolerated the procedure well with no immediate complications.

## 2019-07-29 NOTE — Anesthesia Procedure Notes (Signed)
Procedure Name: Intubation Date/Time: 07/29/2019 9:00 AM Performed by: Mariea Clonts, CRNA Pre-anesthesia Checklist: Patient identified, Emergency Drugs available, Suction available, Patient being monitored and Timeout performed Patient Re-evaluated:Patient Re-evaluated prior to induction Oxygen Delivery Method: Circle system utilized Preoxygenation: Pre-oxygenation with 100% oxygen Induction Type: IV induction Ventilation: Oral airway inserted - appropriate to patient size, Mask ventilation with difficulty and Two handed mask ventilation required Laryngoscope Size: Glidescope and 3 Grade View: Grade I Tube type: Oral Tube size: 7.0 mm Number of attempts: 1 Airway Equipment and Method: Stylet and Video-laryngoscopy Placement Confirmation: ETT inserted through vocal cords under direct vision,  positive ETCO2 and breath sounds checked- equal and bilateral Secured at: 22 cm Tube secured with: Tape Dental Injury: Teeth and Oropharynx as per pre-operative assessment

## 2019-07-29 NOTE — Transfer of Care (Signed)
Immediate Anesthesia Transfer of Care Note  Patient: Candice [name]  Procedure(s) Performed: Arteriogram, Pipeline Embolization of Aneurysm (N/A )  Patient Location: PACU  Anesthesia Type:General  Level of Consciousness: drowsy  Airway & Oxygen Therapy: Patient Spontanous Breathing and Patient connected to face mask oxygen  Post-op Assessment: Report given to RN, Post -op Vital signs reviewed and stable and Patient moving all extremities X 4  Post vital signs: Reviewed and stable  Last Vitals:  Vitals Value Taken Time  BP    Temp    Pulse    Resp    SpO2      Last Pain:  Vitals:   07/29/19 0730  TempSrc:   PainSc: 0-No pain      Patients Stated Pain Goal: 6 ([id number])  Complications: No apparent anesthesia complications

## 2019-07-29 NOTE — Anesthesia Postprocedure Evaluation (Signed)
Anesthesia Post Note  Patient: Candice [name]  Procedure(s) Performed: Arteriogram, Pipeline Embolization of Aneurysm (N/A )     Patient location during evaluation: PACU Anesthesia Type: General Level of consciousness: awake and alert Pain management: pain level controlled Vital Signs Assessment: post-procedure vital signs reviewed and stable Respiratory status: spontaneous breathing, nonlabored ventilation, respiratory function stable and patient connected to nasal cannula oxygen Cardiovascular status: blood pressure returned to baseline and stable Postop Assessment: no apparent nausea or vomiting Anesthetic complications: no    Last Vitals:  Vitals:   07/29/19 1429 07/29/19 1430  BP:    Pulse: 76 76  Resp: 13 14  Temp:  (!) 36.2 C  SpO2: 99% 99%    Last Pain:  Vitals:   07/29/19 1138  TempSrc:   PainSc: [name]

## 2019-07-30 ENCOUNTER — Encounter (HOSPITAL_COMMUNITY): Payer: Self-pay | Admitting: Neurosurgery

## 2019-07-30 DIAGNOSIS — I671 Cerebral aneurysm, nonruptured: Secondary | ICD-10-CM | POA: Diagnosis not present

## 2019-07-30 MED ORDER — CYCLOBENZAPRINE HCL 10 MG PO TABS
10.0000 mg | ORAL_TABLET | Freq: Three times a day (TID) | ORAL | Status: DC | PRN
  Administered 2019-07-30 (×3): 10 mg via ORAL
  Filled 2019-07-30 (×3): qty 1

## 2019-07-30 MED ORDER — METHOCARBAMOL 750 MG PO TABS: 750.0000 mg | ORAL_TABLET | Freq: Three times a day (TID) | ORAL | 1 refills | Status: DC | PRN

## 2019-07-30 NOTE — Care Management CC44 (Signed)
Condition Code 44 Documentation Completed  Patient Details  Name: Candice [name] MRN: [id number] Date of Birth: May 01, 1951   Condition Code 44 given:  Yes Patient signature on Condition Code 44 notice:  Yes Documentation of 2 MD's agreement:  Yes Code 44 added to claim:  Yes    Weston Anna, LCSW 07/30/2019, 2:51 PM

## 2019-07-30 NOTE — Progress Notes (Signed)
Prior to discharge, patient ambulated without any issues, voided S/P foley catheter removal, VSS, and groin site and art line site were all stable. Discharge education went over with patient and her sister. She verbalized understanding, and was given ample time for questions. All questions were answered prior to discharge. She was taken to the main entrance via wheelchair, where her RN helped her get into the passenger side of her sisters car. She was very thankful for the care she received here.

## 2019-07-30 NOTE — Progress Notes (Signed)
Right femoral artery groin site dressing and V pad removed. Groin at Level 0, dressing CDI, distal pulses intact, cap refill <3 sec. Patient with no complaints. Gauze and tape applied to groin site prior to discharge.

## 2019-07-30 NOTE — Progress Notes (Signed)
Puncture site was dressed with a V-Pad and covered with gauze and tegaderm.

## 2019-07-30 NOTE — Progress Notes (Signed)
8 french sheath was removed from the right groin using manual pressure at 9:23.  Site was clean, dry and free from hematomas at the time of removal.  Hemostasis was achieved at 9:45.  Site was soft and free of hematomas.  Site was dressed with gauze and a tegaderm.  Site was viewed by and handed off to Josh(RN).    [facility]

## 2019-07-30 NOTE — Care Management CC44 (Signed)
Condition Code 44 Documentation Completed  Patient Details  Name: Candice [name] MRN: [id number] Date of Birth: 01-14-1951   Condition Code 44 given:  Yes Patient signature on Condition Code 44 notice:  Yes Documentation of 2 MD's agreement:    Code 44 added to claim:  Yes    Weston Anna, LCSW 07/30/2019, 12:37 PM

## 2019-07-30 NOTE — Progress Notes (Signed)
  NEUROSURGERY PROGRESS NOTE   Pt seen and examined. No issues overnight. No HA, mild back pain.  EXAM: Temp:  [97.1 F (36.2 C)-97.8 F (36.6 C)] 97.8 F (36.6 C) (09/16 0400) Pulse Rate:  [62-94] 71 (09/16 0830) Resp:  [10-23] 18 (09/16 0830) BP: (87-141)/(46-107) 96/73 (09/16 0830) SpO2:  [89 %-100 %] 94 % (09/16 0830) Arterial Line BP: (89-149)/(44-71) 149/64 (09/16 0830) Weight:  [97.9 kg] 97.9 kg (09/15 1500) Intake/Output      09/15 0701 - 09/16 0700 09/16 0701 - 09/17 0700   P.O. 600 480   I.V. (mL/kg) 200 (2)    IV Piggyback 250    Total Intake(mL/kg) 1050 (10.7) 480 (4.9)   Urine (mL/kg/hr) 1775 (0.8) 140 (0.7)   Blood 25    Total Output 1800 140   Net -750 +340         Awake, alert, oriented Speech fluent CN intact Good strength throughout  LABS: Lab Results  Component Value Date   CREATININE 1.19 (H) 07/29/2019   BUN 25 (H) 07/29/2019   NA 138 07/29/2019   K 3.4 (L) 07/29/2019   CL 102 07/29/2019   CO2 24 07/29/2019   Lab Results  Component Value Date   WBC 6.8 07/29/2019   HGB 9.9 (L) 07/29/2019   HCT 30.1 (L) 07/29/2019   MCV 100.0 07/29/2019   PLT 255 07/29/2019    IMPRESSION: - 68 y.o. 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 Neurologically at baseline  PLAN: - D/C groin sheath this am - OOB after 6hrs flat - Can likely d/c home this pm

## 2019-07-30 NOTE — Discharge Summary (Signed)
Physician Discharge Summary  Patient ID: Candice [name] MRN: [id number] DOB/AGE: [birth year] 68 y.o.  Admit date: 07/29/2019 Discharge date: 07/30/2019  Admission Diagnoses:  Nonruptured cerebral aneurysm   Discharge Diagnoses:  Same Active Problems:   Cerebral aneurysm without rupture  Discharged Condition: Stable  Hospital Course:  Candice [name] is a 68 y.o. female who was admitted for the below procedure. There were no post operative complications. At time of discharge, pain was well controlled, ambulating, tolerating po, voiding normal. Ready for discharge.  Treatments: Surgery Pipeline embolization LICA aneurysm  Discharge Exam: Blood pressure (!) 125/47, pulse 70, temperature 97.8 F (36.6 C), temperature source Oral, resp. rate 17, height 5\' 6"  (1.676 m), weight 97.9 kg, SpO2 93 %. Awake, alert, oriented Speech fluent, appropriate CN grossly intact 5/5 BUE/BLE Wound c/d/i  Disposition: Discharge disposition: 01-Home or Self Care       Discharge Instructions    Call MD for:  difficulty breathing, headache or visual disturbances   Complete by: As directed    Call MD for:  persistant dizziness or light-headedness   Complete by: As directed    Call MD for:  redness, tenderness, or signs of infection (pain, swelling, redness, odor or green/yellow discharge around incision site)   Complete by: As directed    Call MD for:  severe uncontrolled pain   Complete by: As directed    Call MD for:  temperature >100.4   Complete by: As directed    Increase activity slowly   Complete by: As directed    Remove dressing in 24 hours   Complete by: As directed      Allergies as of 07/30/2019      Reactions   Codeine Nausea Only   Sulfur Rash      Medication List    STOP taking these medications   ibuprofen 200 MG tablet Commonly known as: ADVIL     TAKE these medications   acetaminophen 500 MG tablet Commonly known as: TYLENOL Take 1,000 mg by mouth  every 6 (six) hours as needed (for pain.).   acyclovir 400 MG tablet Commonly known as: ZOVIRAX Take 400 mg by mouth daily.   APPLE CIDER VINEGAR PO Take 3-4 tablets by mouth daily. [place] Apple Cider Gummies   aspirin 325 MG tablet Take 325 mg by mouth daily.   ASTHMANEFRIN IN Inhale 1 Dose into the lungs every 6 (six) hours as needed (breathing issues).   BLACK CURRANT SEED OIL PO Take 5 mLs by mouth daily. Cold Pressed Black Seed Oil   BRAIN PO Take 1 tablet by mouth at bedtime. Neuriva Original Brain Performance   citalopram 20 MG tablet Commonly known as: CELEXA Take 20 mg by mouth daily. In the morning   clonazePAM 0.5 MG tablet Commonly known as: KLONOPIN Take 0.25 mg by mouth at bedtime.   clopidogrel 75 MG tablet Commonly known as: PLAVIX Take 75 mg by mouth daily.   COQ-10 PO Take 15 mLs by mouth daily.   cyclobenzaprine 10 MG tablet Commonly known as: FLEXERIL Take 10 mg by mouth at bedtime as needed (pain/spasms).   ferumoxytol 510 mg in sodium chloride 0.9 % 100 mL Inject 510 mg into the vein as needed. Per oncology   Fish Oil 1000 MG Caps Take 1,000 mg by mouth daily.   Fluticasone-Salmeterol 250-50 MCG/DOSE Aepb Commonly known as: ADVAIR Inhale 1 puff into the lungs daily.   GLUCOSAMINE CHONDROITIN TRIPLE PO Take 1 tablet by mouth daily.   OSTEO  BI-FLEX ADV TRIPLE ST PO Take 1 tablet by mouth daily.   HAIR/SKIN/NAILS PO Take 1 tablet by mouth daily.   Linzess 145 MCG Caps capsule Generic drug: linaclotide Take 145 mcg by mouth daily as needed (for constipation).   loratadine 10 MG tablet Commonly known as: CLARITIN Take 10 mg by mouth daily.   MAGNESIUM PO Take 70 mg by mouth daily. Cr[employer] Defense   methocarbamol 750 MG tablet Commonly known as: Robaxin-750 Take 1 tablet (750 mg total) by mouth 3 (three) times daily as needed for muscle spasms.   olmesartan-hydrochlorothiazide 20-12.5 MG tablet Commonly known as: BENICAR HCT Take  1 tablet by mouth daily.   orlistat 60 MG capsule Commonly known as: ALLI Take 60 mg by mouth 3 (three) times daily with meals.   pantoprazole 40 MG tablet Commonly known as: PROTONIX Take 40 mg by mouth daily. Heartburn   ProAir HFA 108 (90 Base) MCG/ACT inhaler Generic drug: albuterol Take 2 puffs by mouth 4 (four) times daily as needed for wheezing or shortness of breath.   Vitamin B-12 2500 MCG Subl Take 2,500 mcg by mouth daily.   Vitamin D3 10 MCG (400 UNIT) tablet Take 400 Units by mouth daily.      Follow-up Information    Lisbeth Renshaw, MD. Schedule an appointment as soon as possible for a visit in 3 week(s).   Specialty: Neurosurgery Contact information: [address] [place] [address] [phone]           Signed: Alyson Ingles 07/30/2019, 9:25 AM

## 2019-07-30 NOTE — Care Management Obs Status (Signed)
[facility] NOTIFICATION   Patient Details  Name: ORLETTA SOLIMINE MRN: [id number] Date of Birth: 22-Jan-1951   Medicare Observation Status Notification Given:  Yes    Weston Anna, LCSW 07/30/2019, 12:37 PM

## 2019-07-30 NOTE — Brief Op Note (Signed)
  NEUROSURGERY BRIEF OPERATIVE  NOTE   PREOP DX: LICA aneurysm  POSTOP DX: Same  PROCEDURE: Pipeline embolization LICA aneurysm  SURGEON: Dr. Consuella Lose, MD  ANESTHESIA: GETA  EBL: Minimal  SPECIMENS: None  COMPLICATIONS: None  CONDITION: Stable to recovery  FINDINGS (Full report in [facility]): 1. Successful Pipeline embolization LICA aneurysm complicated by significant cervical carotid catheter induced vasospasm. Spasm treated with intra-arterial Integrilin, nitroglycerin and verapamil with return to near normal. 2. Right groin sheath secured in place post-procedure

## 2019-08-05 DIAGNOSIS — S00412A Abrasion of left ear, initial encounter: Secondary | ICD-10-CM | POA: Diagnosis not present

## 2019-08-05 DIAGNOSIS — Z6834 Body mass index (BMI) 34.0-34.9, adult: Secondary | ICD-10-CM | POA: Diagnosis not present

## 2019-08-05 DIAGNOSIS — R040 Epistaxis: Secondary | ICD-10-CM | POA: Diagnosis not present

## 2019-08-12 ENCOUNTER — Encounter (HOSPITAL_COMMUNITY): Payer: Self-pay | Admitting: Neurosurgery

## 2019-08-18 DIAGNOSIS — I671 Cerebral aneurysm, nonruptured: Secondary | ICD-10-CM | POA: Diagnosis not present

## 2019-08-21 ENCOUNTER — Other Ambulatory Visit: Payer: Self-pay

## 2019-08-21 ENCOUNTER — Telehealth (HOSPITAL_COMMUNITY): Payer: Self-pay | Admitting: *Deleted

## 2019-08-21 ENCOUNTER — Inpatient Hospital Stay (HOSPITAL_COMMUNITY): Payer: Medicare Other | Attending: Nurse Practitioner

## 2019-08-21 ENCOUNTER — Other Ambulatory Visit (HOSPITAL_COMMUNITY): Payer: Medicare Other

## 2019-08-21 DIAGNOSIS — D509 Iron deficiency anemia, unspecified: Secondary | ICD-10-CM | POA: Diagnosis not present

## 2019-08-21 DIAGNOSIS — D649 Anemia, unspecified: Secondary | ICD-10-CM

## 2019-08-21 LAB — CBC WITH DIFFERENTIAL/PLATELET
Abs Immature Granulocytes: 0.03 10*3/uL (ref 0.00–0.07)
Basophils Absolute: 0 10*3/uL (ref 0.0–0.1)
Basophils Relative: 0 %
Eosinophils Absolute: 0.2 10*3/uL (ref 0.0–0.5)
Eosinophils Relative: 2 %
HCT: 25.5 % — ABNORMAL LOW (ref 36.0–46.0)
Hemoglobin: 7.8 g/dL — ABNORMAL LOW (ref 12.0–15.0)
Immature Granulocytes: 0 %
Lymphocytes Relative: 18 %
Lymphs Abs: 1.4 10*3/uL (ref 0.7–4.0)
MCH: 33.6 pg (ref 26.0–34.0)
MCHC: 30.6 g/dL (ref 30.0–36.0)
MCV: 109.9 fL — ABNORMAL HIGH (ref 80.0–100.0)
Monocytes Absolute: 0.8 10*3/uL (ref 0.1–1.0)
Monocytes Relative: 10 %
Neutro Abs: 5.4 10*3/uL (ref 1.7–7.7)
Neutrophils Relative %: 70 %
Platelets: 344 10*3/uL (ref 150–400)
RBC: 2.32 MIL/uL — ABNORMAL LOW (ref 3.87–5.11)
RDW: 18.9 % — ABNORMAL HIGH (ref 11.5–15.5)
WBC: 7.8 10*3/uL (ref 4.0–10.5)
nRBC: 0 % (ref 0.0–0.2)

## 2019-08-21 LAB — COMPREHENSIVE METABOLIC PANEL
ALT: 16 U/L (ref 0–44)
AST: 21 U/L (ref 15–41)
Albumin: 3.9 g/dL (ref 3.5–5.0)
Alkaline Phosphatase: 77 U/L (ref 38–126)
Anion gap: 13 (ref 5–15)
BUN: 27 mg/dL — ABNORMAL HIGH (ref 8–23)
CO2: 23 mmol/L (ref 22–32)
Calcium: 9.2 mg/dL (ref 8.9–10.3)
Chloride: 104 mmol/L (ref 98–111)
Creatinine, Ser: 1.78 mg/dL — ABNORMAL HIGH (ref 0.44–1.00)
GFR calc Af Amer: 33 mL/min — ABNORMAL LOW (ref >60)
GFR calc non Af Amer: 28 mL/min — ABNORMAL LOW (ref >60)
Glucose, Bld: 121 mg/dL — ABNORMAL HIGH (ref 70–99)
Potassium: 3.7 mmol/L (ref 3.5–5.1)
Sodium: 140 mmol/L (ref 135–145)
Total Bilirubin: 0.1 mg/dL — ABNORMAL LOW (ref 0.3–1.2)
Total Protein: 6.8 g/dL (ref 6.5–8.1)

## 2019-08-21 LAB — VITAMIN B12: Vitamin B-12: 2150 pg/mL — ABNORMAL HIGH (ref 180–914)

## 2019-08-21 LAB — IRON AND TIBC
Iron: 44 ug/dL (ref 28–170)
Saturation Ratios: 13 % (ref 10.4–31.8)
TIBC: 347 ug/dL (ref 250–450)
UIBC: 303 ug/dL

## 2019-08-21 LAB — LACTATE DEHYDROGENASE: LDH: 150 U/L (ref 98–192)

## 2019-08-21 LAB — FOLATE: Folate: 53.4 ng/mL (ref >5.9)

## 2019-08-21 LAB — VITAMIN D 25 HYDROXY (VIT D DEFICIENCY, FRACTURES): Vit D, 25-Hydroxy: 33.39 ng/mL (ref 30–100)

## 2019-08-21 LAB — FERRITIN: Ferritin: 89 ng/mL (ref 11–307)

## 2019-08-22 ENCOUNTER — Other Ambulatory Visit: Payer: Self-pay

## 2019-08-22 ENCOUNTER — Other Ambulatory Visit (HOSPITAL_COMMUNITY): Payer: Self-pay | Admitting: Nurse Practitioner

## 2019-08-22 ENCOUNTER — Encounter (HOSPITAL_COMMUNITY): Payer: Self-pay

## 2019-08-22 ENCOUNTER — Inpatient Hospital Stay (HOSPITAL_COMMUNITY): Payer: Medicare Other

## 2019-08-22 VITALS — BP 110/52 | HR 79 | Temp 97.9°F | Resp 18

## 2019-08-22 DIAGNOSIS — D649 Anemia, unspecified: Secondary | ICD-10-CM

## 2019-08-22 DIAGNOSIS — D509 Iron deficiency anemia, unspecified: Secondary | ICD-10-CM | POA: Diagnosis not present

## 2019-08-22 MED ORDER — SODIUM CHLORIDE 0.9 % IV SOLN
510.0000 mg | Freq: Once | INTRAVENOUS | Status: AC
  Administered 2019-08-22: 510 mg via INTRAVENOUS
  Filled 2019-08-22: qty 510

## 2019-08-22 MED ORDER — SODIUM CHLORIDE 0.9 % IV SOLN
Freq: Once | INTRAVENOUS | Status: AC
  Administered 2019-08-22: 11:00:00 via INTRAVENOUS

## 2019-08-22 NOTE — Patient Instructions (Signed)
[facility] at [facility] Discharge Instructions  Received Feraheme infusion today. Follow-up as scheduled. Call clinic for any questions or concerns   Thank you for choosing [facility] at [facility] to provide your oncology and hematology care.  To afford each patient quality time with our provider, please arrive at least 15 minutes before your scheduled appointment time.   If you have a lab appointment with the Cancer Center please come in thru the Main Entrance and check in at the main information desk.  You need to re-schedule your appointment should you arrive 10 or more minutes late.  We strive to give you quality time with our providers, and arriving late affects you and other patients whose appointments are after yours.  Also, if you no show three or more times for appointments you may be dismissed from the clinic at the providers discretion.     Again, thank you for choosing [facility].  Our hope is that these requests will decrease the amount of time that you wait before being seen by our physicians.       _____________________________________________________________  Should you have questions after your visit to [facility], please contact our office at [phone] between the hours of 8:00 a.m. and 4:30 p.m.  Voicemails left after 4:00 p.m. will not be returned until the following business day.  For prescription refill requests, have your pharmacy contact our office and allow 72 hours.    Due to Covid, you will need to wear a mask upon entering the hospital. If you do not have a mask, a mask will be given to you at the Main Entrance upon arrival. For doctor visits, patients may have 1 support person with them. For treatment visits, patients can not have anyone with them due to social distancing guidelines and our immunocompromised population.     

## 2019-08-22 NOTE — Progress Notes (Signed)
Candice [name] tolerated Feraheme infusion well without complaints or incident. Peripheral IV site checked with positive blood return noted prior to and after infusion. VSS upon discharge. Pt discharged self ambulatory in satisfactory condition 

## 2019-08-29 ENCOUNTER — Encounter (HOSPITAL_COMMUNITY): Payer: Self-pay

## 2019-08-29 ENCOUNTER — Other Ambulatory Visit: Payer: Self-pay

## 2019-08-29 ENCOUNTER — Inpatient Hospital Stay (HOSPITAL_COMMUNITY): Payer: Medicare Other

## 2019-08-29 VITALS — BP 104/57 | HR 73 | Temp 97.5°F | Resp 18

## 2019-08-29 DIAGNOSIS — D649 Anemia, unspecified: Secondary | ICD-10-CM

## 2019-08-29 DIAGNOSIS — D509 Iron deficiency anemia, unspecified: Secondary | ICD-10-CM | POA: Diagnosis not present

## 2019-08-29 MED ORDER — SODIUM CHLORIDE 0.9 % IV SOLN
Freq: Once | INTRAVENOUS | Status: AC
  Administered 2019-08-29: 10:00:00 via INTRAVENOUS

## 2019-08-29 MED ORDER — SODIUM CHLORIDE 0.9 % IV SOLN
510.0000 mg | Freq: Once | INTRAVENOUS | Status: AC
  Administered 2019-08-29: 510 mg via INTRAVENOUS
  Filled 2019-08-29: qty 510

## 2019-08-29 NOTE — Progress Notes (Signed)
Peripheral IV site checked with positive blood return noted prior to and after infusion complet

## 2019-08-29 NOTE — Progress Notes (Signed)
Candice [name] tolerated Feraheme infusion well without complaints or incident. VSS upon discharge. Pt discharged self ambulatory in satisfactory condition 

## 2019-08-29 NOTE — Patient Instructions (Signed)
[facility] at [facility] Discharge Instructions  Received Feraheme infusion today. Follow-up as scheduled. Call clinic for any questions or concerns   Thank you for choosing [facility] at [facility] to provide your oncology and hematology care.  To afford each patient quality time with our provider, please arrive at least 15 minutes before your scheduled appointment time.   If you have a lab appointment with the Cancer Center please come in thru the Main Entrance and check in at the main information desk.  You need to re-schedule your appointment should you arrive 10 or more minutes late.  We strive to give you quality time with our providers, and arriving late affects you and other patients whose appointments are after yours.  Also, if you no show three or more times for appointments you may be dismissed from the clinic at the providers discretion.     Again, thank you for choosing [facility].  Our hope is that these requests will decrease the amount of time that you wait before being seen by our physicians.       _____________________________________________________________  Should you have questions after your visit to [facility], please contact our office at [phone] between the hours of 8:00 a.m. and 4:30 p.m.  Voicemails left after 4:00 p.m. will not be returned until the following business day.  For prescription refill requests, have your pharmacy contact our office and allow 72 hours.    Due to Covid, you will need to wear a mask upon entering the hospital. If you do not have a mask, a mask will be given to you at the Main Entrance upon arrival. For doctor visits, patients may have 1 support person with them. For treatment visits, patients can not have anyone with them due to social distancing guidelines and our immunocompromised population.     

## 2019-09-12 DIAGNOSIS — E782 Mixed hyperlipidemia: Secondary | ICD-10-CM | POA: Diagnosis not present

## 2019-09-12 DIAGNOSIS — I10 Essential (primary) hypertension: Secondary | ICD-10-CM | POA: Diagnosis not present

## 2019-09-19 ENCOUNTER — Other Ambulatory Visit (HOSPITAL_COMMUNITY): Payer: Self-pay

## 2019-09-19 DIAGNOSIS — D649 Anemia, unspecified: Secondary | ICD-10-CM

## 2019-09-19 DIAGNOSIS — D508 Other iron deficiency anemias: Secondary | ICD-10-CM

## 2019-09-22 ENCOUNTER — Inpatient Hospital Stay (HOSPITAL_COMMUNITY): Payer: Medicare Other | Attending: Hematology

## 2019-09-22 ENCOUNTER — Other Ambulatory Visit: Payer: Self-pay

## 2019-09-22 DIAGNOSIS — Z7982 Long term (current) use of aspirin: Secondary | ICD-10-CM | POA: Diagnosis not present

## 2019-09-22 DIAGNOSIS — Z79899 Other long term (current) drug therapy: Secondary | ICD-10-CM | POA: Insufficient documentation

## 2019-09-22 DIAGNOSIS — D509 Iron deficiency anemia, unspecified: Secondary | ICD-10-CM | POA: Diagnosis not present

## 2019-09-22 DIAGNOSIS — Z87891 Personal history of nicotine dependence: Secondary | ICD-10-CM | POA: Diagnosis not present

## 2019-09-22 DIAGNOSIS — D649 Anemia, unspecified: Secondary | ICD-10-CM

## 2019-09-22 DIAGNOSIS — I10 Essential (primary) hypertension: Secondary | ICD-10-CM | POA: Diagnosis not present

## 2019-09-22 DIAGNOSIS — D508 Other iron deficiency anemias: Secondary | ICD-10-CM

## 2019-09-22 LAB — CBC WITH DIFFERENTIAL/PLATELET
Abs Immature Granulocytes: 0.02 10*3/uL (ref 0.00–0.07)
Basophils Absolute: 0 10*3/uL (ref 0.0–0.1)
Basophils Relative: 1 %
Eosinophils Absolute: 0.2 10*3/uL (ref 0.0–0.5)
Eosinophils Relative: 4 %
HCT: 26.7 % — ABNORMAL LOW (ref 36.0–46.0)
Hemoglobin: 8.1 g/dL — ABNORMAL LOW (ref 12.0–15.0)
Immature Granulocytes: 0 %
Lymphocytes Relative: 22 %
Lymphs Abs: 1.3 10*3/uL (ref 0.7–4.0)
MCH: 33.3 pg (ref 26.0–34.0)
MCHC: 30.3 g/dL (ref 30.0–36.0)
MCV: 109.9 fL — ABNORMAL HIGH (ref 80.0–100.0)
Monocytes Absolute: 0.5 10*3/uL (ref 0.1–1.0)
Monocytes Relative: 9 %
Neutro Abs: 3.8 10*3/uL (ref 1.7–7.7)
Neutrophils Relative %: 64 %
Platelets: 381 10*3/uL (ref 150–400)
RBC: 2.43 MIL/uL — ABNORMAL LOW (ref 3.87–5.11)
RDW: 16.3 % — ABNORMAL HIGH (ref 11.5–15.5)
WBC: 5.9 10*3/uL (ref 4.0–10.5)
nRBC: 0 % (ref 0.0–0.2)

## 2019-09-22 LAB — COMPREHENSIVE METABOLIC PANEL
ALT: 16 U/L (ref 0–44)
AST: 23 U/L (ref 15–41)
Albumin: 3.9 g/dL (ref 3.5–5.0)
Alkaline Phosphatase: 68 U/L (ref 38–126)
Anion gap: 8 (ref 5–15)
BUN: 21 mg/dL (ref 8–23)
CO2: 27 mmol/L (ref 22–32)
Calcium: 9.8 mg/dL (ref 8.9–10.3)
Chloride: 105 mmol/L (ref 98–111)
Creatinine, Ser: 1.35 mg/dL — ABNORMAL HIGH (ref 0.44–1.00)
GFR calc Af Amer: 46 mL/min — ABNORMAL LOW (ref >60)
GFR calc non Af Amer: 40 mL/min — ABNORMAL LOW (ref >60)
Glucose, Bld: 112 mg/dL — ABNORMAL HIGH (ref 70–99)
Potassium: 3.3 mmol/L — ABNORMAL LOW (ref 3.5–5.1)
Sodium: 140 mmol/L (ref 135–145)
Total Bilirubin: 0.4 mg/dL (ref 0.3–1.2)
Total Protein: 7.2 g/dL (ref 6.5–8.1)

## 2019-09-22 LAB — LACTATE DEHYDROGENASE: LDH: 138 U/L (ref 98–192)

## 2019-09-22 LAB — VITAMIN B12: Vitamin B-12: >1500 pg/mL — ABNORMAL HIGH (ref 180–914)

## 2019-09-22 LAB — IRON AND TIBC
Iron: 67 ug/dL (ref 28–170)
Saturation Ratios: 19 % (ref 10.4–31.8)
TIBC: 354 ug/dL (ref 250–450)
UIBC: 287 ug/dL

## 2019-09-22 LAB — FOLATE: Folate: 42.3 ng/mL (ref >5.9)

## 2019-09-22 LAB — VITAMIN D 25 HYDROXY (VIT D DEFICIENCY, FRACTURES): Vit D, 25-Hydroxy: 53.98 ng/mL (ref 30–100)

## 2019-09-22 LAB — FERRITIN: Ferritin: 104 ng/mL (ref 11–307)

## 2019-09-29 ENCOUNTER — Inpatient Hospital Stay (HOSPITAL_BASED_OUTPATIENT_CLINIC_OR_DEPARTMENT_OTHER): Payer: Medicare Other | Admitting: Hematology

## 2019-09-29 ENCOUNTER — Other Ambulatory Visit: Payer: Self-pay

## 2019-09-29 ENCOUNTER — Encounter (HOSPITAL_COMMUNITY): Payer: Self-pay | Admitting: Hematology

## 2019-09-29 VITALS — BP 118/50 | HR 72 | Temp 97.3°F | Resp 18 | Wt 210.2 lb

## 2019-09-29 DIAGNOSIS — I10 Essential (primary) hypertension: Secondary | ICD-10-CM | POA: Diagnosis not present

## 2019-09-29 DIAGNOSIS — Z87891 Personal history of nicotine dependence: Secondary | ICD-10-CM | POA: Diagnosis not present

## 2019-09-29 DIAGNOSIS — D649 Anemia, unspecified: Secondary | ICD-10-CM

## 2019-09-29 DIAGNOSIS — D509 Iron deficiency anemia, unspecified: Secondary | ICD-10-CM | POA: Diagnosis not present

## 2019-09-29 DIAGNOSIS — D508 Other iron deficiency anemias: Secondary | ICD-10-CM

## 2019-09-29 DIAGNOSIS — Z7982 Long term (current) use of aspirin: Secondary | ICD-10-CM | POA: Diagnosis not present

## 2019-09-29 DIAGNOSIS — Z79899 Other long term (current) drug therapy: Secondary | ICD-10-CM | POA: Diagnosis not present

## 2019-09-29 NOTE — Assessment & Plan Note (Signed)
1.  Normocytic anemia: -Combination anemia from relative iron deficiency state, chronic kidney disease and likely blood loss. -Colonoscopy on 07/24/2017 shows diverticulosis of the sigmoid colon, external hemorrhoids. -Denies any bleeding per rectum or melena. -Candice [name] underwent pipeline embolization of LICA aneurysm on [id number]. -Candice [name] was subsequently started on aspirin 325 mg and Plavix 75 mg. -Last Feraheme infusion on 08/22/2019 and 08/29/2019. -We reviewed blood work which showed hemoglobin 8.1.  Ferritin is 104. -Candice [name] did not get anticipated improvement in her ferritin or hemoglobin.  Candice [name] could be losing blood as Candice [name] is on dual antiplatelet agents. -We will check her stool for occult blood.  We will give her 2 more infusions of Feraheme.  I will see her back in 4 weeks for follow-up. -Despite iron stores repleted, if Candice [name] does not have improvement in hemoglobin, will consider erythropoiesis stimulating agents.

## 2019-09-29 NOTE — Patient Instructions (Signed)
[place] at [facility] Discharge Instructions  You were seen today by Dr. Delton Coombes. He went over your recent lab results. He will schedule you for 2 weekly doses of IV Iron. He will see you back in 4 weeks for labs and follow up.   Thank you for choosing [place] at [facility] to provide your oncology and hematology care.  To afford each patient quality time with our provider, please arrive at least 15 minutes before your scheduled appointment time.   If you have a lab appointment with the [facility] please come in thru the  Main Entrance and check in at the main information desk  You need to re-schedule your appointment should you arrive 10 or more minutes late.  We strive to give you quality time with our providers, and arriving late affects you and other patients whose appointments are after yours.  Also, if you no show three or more times for appointments you may be dismissed from the clinic at the providers discretion.     Again, thank you for choosing [facility].  Our hope is that these requests will decrease the amount of time that you wait before being seen by our physicians.       _____________________________________________________________  Should you have questions after your visit to [facility], please contact our office at [phone] between the hours of 8:00 a.m. and 4:30 p.m.  Voicemails left after 4:00 p.m. will not be returned until the following business day.  For prescription refill requests, have your pharmacy contact our office and allow 72 hours.    Cancer Center Support Programs:   > Cancer Support Group  2nd Tuesday of the month 1pm-2pm, Journey Room
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
## 2019-10-01 ENCOUNTER — Encounter (HOSPITAL_COMMUNITY): Payer: Self-pay

## 2019-10-01 ENCOUNTER — Other Ambulatory Visit: Payer: Self-pay

## 2019-10-01 ENCOUNTER — Inpatient Hospital Stay (HOSPITAL_COMMUNITY): Payer: Medicare Other

## 2019-10-01 VITALS — BP 123/54 | HR 87 | Temp 97.1°F | Resp 18

## 2019-10-01 DIAGNOSIS — Z87891 Personal history of nicotine dependence: Secondary | ICD-10-CM | POA: Diagnosis not present

## 2019-10-01 DIAGNOSIS — Z7982 Long term (current) use of aspirin: Secondary | ICD-10-CM | POA: Diagnosis not present

## 2019-10-01 DIAGNOSIS — Z79899 Other long term (current) drug therapy: Secondary | ICD-10-CM | POA: Diagnosis not present

## 2019-10-01 DIAGNOSIS — D649 Anemia, unspecified: Secondary | ICD-10-CM

## 2019-10-01 DIAGNOSIS — I671 Cerebral aneurysm, nonruptured: Secondary | ICD-10-CM | POA: Diagnosis not present

## 2019-10-01 DIAGNOSIS — D509 Iron deficiency anemia, unspecified: Secondary | ICD-10-CM | POA: Diagnosis not present

## 2019-10-01 DIAGNOSIS — I10 Essential (primary) hypertension: Secondary | ICD-10-CM | POA: Diagnosis not present

## 2019-10-01 LAB — OCCULT BLOOD X 1 CARD TO LAB, STOOL
Fecal Occult Bld: POSITIVE — AB
Fecal Occult Bld: POSITIVE — AB
Fecal Occult Bld: POSITIVE — AB

## 2019-10-01 MED ORDER — SODIUM CHLORIDE 0.9 % IV SOLN
510.0000 mg | Freq: Once | INTRAVENOUS | Status: AC
  Administered 2019-10-01: 510 mg via INTRAVENOUS
  Filled 2019-10-01: qty 510

## 2019-10-01 MED ORDER — SODIUM CHLORIDE 0.9 % IV SOLN
Freq: Once | INTRAVENOUS | Status: AC
  Administered 2019-10-01: 14:00:00 via INTRAVENOUS

## 2019-10-01 NOTE — Progress Notes (Signed)
Pt presents today for Feraheme infusion. MAR reviewed . Vital signs within normal limits. Pt has no complaints of any changes today.    Treatment given today per MD orders. Tolerated infusion without adverse affects. Vital signs stable. No complaints at this time. Discharged from clinic ambulatory. F/U with [facility] as scheduled.

## 2019-10-07 ENCOUNTER — Other Ambulatory Visit: Payer: Self-pay | Admitting: Neurosurgery

## 2019-10-07 ENCOUNTER — Other Ambulatory Visit: Payer: Self-pay

## 2019-10-07 DIAGNOSIS — I671 Cerebral aneurysm, nonruptured: Secondary | ICD-10-CM

## 2019-10-08 ENCOUNTER — Inpatient Hospital Stay (HOSPITAL_COMMUNITY): Payer: Medicare Other

## 2019-10-08 VITALS — BP 100/50 | HR 80 | Temp 96.9°F | Resp 16

## 2019-10-08 DIAGNOSIS — I10 Essential (primary) hypertension: Secondary | ICD-10-CM | POA: Diagnosis not present

## 2019-10-08 DIAGNOSIS — Z87891 Personal history of nicotine dependence: Secondary | ICD-10-CM | POA: Diagnosis not present

## 2019-10-08 DIAGNOSIS — D649 Anemia, unspecified: Secondary | ICD-10-CM

## 2019-10-08 DIAGNOSIS — Z79899 Other long term (current) drug therapy: Secondary | ICD-10-CM | POA: Diagnosis not present

## 2019-10-08 DIAGNOSIS — D509 Iron deficiency anemia, unspecified: Secondary | ICD-10-CM | POA: Diagnosis not present

## 2019-10-08 DIAGNOSIS — Z7982 Long term (current) use of aspirin: Secondary | ICD-10-CM | POA: Diagnosis not present

## 2019-10-08 MED ORDER — SODIUM CHLORIDE 0.9 % IV SOLN
510.0000 mg | Freq: Once | INTRAVENOUS | Status: AC
  Administered 2019-10-08: 510 mg via INTRAVENOUS
  Filled 2019-10-08: qty 17

## 2019-10-08 MED ORDER — SODIUM CHLORIDE 0.9 % IV SOLN
Freq: Once | INTRAVENOUS | Status: AC
  Administered 2019-10-08: 14:00:00 via INTRAVENOUS

## 2019-10-08 NOTE — Patient Instructions (Signed)
[facility] at [facility]  Discharge Instructions:   _______________________________________________________________  Thank you for choosing [facility] at [facility] to provide your oncology and hematology care.  To afford each patient quality time with our providers, please arrive at least 15 minutes before your scheduled appointment.  You need to re-schedule your appointment if you arrive 10 or more minutes late.  We strive to give you quality time with our providers, and arriving late affects you and other patients whose appointments are after yours.  Also, if you no show three or more times for appointments you may be dismissed from the clinic.  Again, thank you for choosing [facility]. Our hope is that these requests will allow you access to exceptional care and in a timely manner. _______________________________________________________________  If you have questions after your visit, please contact our office at [phone] between the hours of 8:30 a.m. and 5:00 p.m. Voicemails left after 4:30 p.m. will not be returned until the following business day. _______________________________________________________________  For prescription refill requests, have your pharmacy contact our office. _______________________________________________________________  Recommendations made by the consultant and any test results will be sent to your referring physician. _______________________________________________________________ 

## 2019-10-08 NOTE — Progress Notes (Signed)
Feraheme given today per MD orders. Tolerated infusion without adverse affects. Vital signs stable. No complaints at this time. Discharged from clinic ambulatory. F/U with [facility] as scheduled.  

## 2019-10-10 ENCOUNTER — Other Ambulatory Visit (HOSPITAL_COMMUNITY): Payer: Medicare Other

## 2019-10-10 ENCOUNTER — Other Ambulatory Visit (HOSPITAL_COMMUNITY)
Admission: RE | Admit: 2019-10-10 | Discharge: 2019-10-10 | Disposition: A | Discharge status: Home / Self Care | Payer: Medicare Other | Source: Ambulatory Visit | Attending: Neurosurgery | Admitting: Neurosurgery

## 2019-10-10 DIAGNOSIS — Z20828 Contact with and (suspected) exposure to other viral communicable diseases: Secondary | ICD-10-CM | POA: Diagnosis not present

## 2019-10-10 DIAGNOSIS — Z01812 Encounter for preprocedural laboratory examination: Secondary | ICD-10-CM | POA: Insufficient documentation

## 2019-10-10 LAB — SARS CORONAVIRUS 2 (TAT 6-24 HRS): SARS Coronavirus 2: NEGATIVE

## 2019-10-13 ENCOUNTER — Other Ambulatory Visit: Payer: Self-pay | Admitting: Neurosurgery

## 2019-10-13 DIAGNOSIS — I10 Essential (primary) hypertension: Secondary | ICD-10-CM | POA: Diagnosis not present

## 2019-10-13 DIAGNOSIS — E782 Mixed hyperlipidemia: Secondary | ICD-10-CM | POA: Diagnosis not present
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
## 2019-10-14 ENCOUNTER — Other Ambulatory Visit: Payer: Self-pay | Admitting: Neurosurgery

## 2019-10-14 ENCOUNTER — Ambulatory Visit (HOSPITAL_COMMUNITY)
Admission: RE | Admit: 2019-10-14 | Discharge: 2019-10-14 | Disposition: A | Discharge status: Home / Self Care | Payer: Medicare Other | Source: Ambulatory Visit | Attending: Neurosurgery | Admitting: Neurosurgery

## 2019-10-14 ENCOUNTER — Encounter (HOSPITAL_COMMUNITY): Payer: Self-pay | Admitting: Neurosurgery

## 2019-10-14 ENCOUNTER — Other Ambulatory Visit: Payer: Self-pay

## 2019-10-14 DIAGNOSIS — I671 Cerebral aneurysm, nonruptured: Secondary | ICD-10-CM | POA: Insufficient documentation

## 2019-10-14 DIAGNOSIS — Z87891 Personal history of nicotine dependence: Secondary | ICD-10-CM | POA: Insufficient documentation

## 2019-10-14 DIAGNOSIS — Z7951 Long term (current) use of inhaled steroids: Secondary | ICD-10-CM | POA: Diagnosis not present

## 2019-10-14 DIAGNOSIS — Z79899 Other long term (current) drug therapy: Secondary | ICD-10-CM | POA: Insufficient documentation

## 2019-10-14 DIAGNOSIS — I10 Essential (primary) hypertension: Secondary | ICD-10-CM | POA: Diagnosis not present

## 2019-10-14 DIAGNOSIS — Z7982 Long term (current) use of aspirin: Secondary | ICD-10-CM | POA: Diagnosis not present

## 2019-10-14 DIAGNOSIS — F329 Major depressive disorder, single episode, unspecified: Secondary | ICD-10-CM | POA: Insufficient documentation

## 2019-10-14 DIAGNOSIS — K219 Gastro-esophageal reflux disease without esophagitis: Secondary | ICD-10-CM | POA: Diagnosis not present

## 2019-10-14 DIAGNOSIS — J45909 Unspecified asthma, uncomplicated: Secondary | ICD-10-CM | POA: Insufficient documentation

## 2019-10-14 DIAGNOSIS — M1990 Unspecified osteoarthritis, unspecified site: Secondary | ICD-10-CM | POA: Diagnosis not present

## 2019-10-14 DIAGNOSIS — F419 Anxiety disorder, unspecified: Secondary | ICD-10-CM | POA: Insufficient documentation

## 2019-10-14 DIAGNOSIS — Z7902 Long term (current) use of antithrombotics/antiplatelets: Secondary | ICD-10-CM | POA: Diagnosis not present

## 2019-10-14 HISTORY — PX: IR ANGIO INTRA EXTRACRAN SEL COM CAROTID INNOMINATE UNI L MOD SED: IMG5358

## 2019-10-14 HISTORY — PX: IR US GUIDE VASC ACCESS RIGHT: IMG2390

## 2019-10-14 LAB — BASIC METABOLIC PANEL
Anion gap: 11 (ref 5–15)
BUN: 32 mg/dL — ABNORMAL HIGH (ref 8–23)
CO2: 25 mmol/L (ref 22–32)
Calcium: 10.2 mg/dL (ref 8.9–10.3)
Chloride: 102 mmol/L (ref 98–111)
Creatinine, Ser: 1.78 mg/dL — ABNORMAL HIGH (ref 0.44–1.00)
GFR calc Af Amer: 33 mL/min — ABNORMAL LOW (ref >60)
GFR calc non Af Amer: 28 mL/min — ABNORMAL LOW (ref >60)
Glucose, Bld: 110 mg/dL — ABNORMAL HIGH (ref 70–99)
Potassium: 3.7 mmol/L (ref 3.5–5.1)
Sodium: 138 mmol/L (ref 135–145)

## 2019-10-14 LAB — CBC WITH DIFFERENTIAL/PLATELET
Abs Immature Granulocytes: 0.05 10*3/uL (ref 0.00–0.07)
Basophils Absolute: 0 10*3/uL (ref 0.0–0.1)
Basophils Relative: 1 %
Eosinophils Absolute: 0.3 10*3/uL (ref 0.0–0.5)
Eosinophils Relative: 3 %
HCT: 32.1 % — ABNORMAL LOW (ref 36.0–46.0)
Hemoglobin: 10 g/dL — ABNORMAL LOW (ref 12.0–15.0)
Immature Granulocytes: 1 %
Lymphocytes Relative: 20 %
Lymphs Abs: 1.8 10*3/uL (ref 0.7–4.0)
MCH: 33 pg (ref 26.0–34.0)
MCHC: 31.2 g/dL (ref 30.0–36.0)
MCV: 105.9 fL — ABNORMAL HIGH (ref 80.0–100.0)
Monocytes Absolute: 0.7 10*3/uL (ref 0.1–1.0)
Monocytes Relative: 8 %
Neutro Abs: 6 10*3/uL (ref 1.7–7.7)
Neutrophils Relative %: 67 %
Platelets: 324 10*3/uL (ref 150–400)
RBC: 3.03 MIL/uL — ABNORMAL LOW (ref 3.87–5.11)
RDW: 16.1 % — ABNORMAL HIGH (ref 11.5–15.5)
WBC: 8.8 10*3/uL (ref 4.0–10.5)
nRBC: 0 % (ref 0.0–0.2)

## 2019-10-14 LAB — URINALYSIS, ROUTINE W REFLEX MICROSCOPIC
Bilirubin Urine: NEGATIVE
Glucose, UA: NEGATIVE mg/dL
Hgb urine dipstick: NEGATIVE
Ketones, ur: NEGATIVE mg/dL
Leukocytes,Ua: NEGATIVE
Nitrite: NEGATIVE
Protein, ur: NEGATIVE mg/dL
Specific Gravity, Urine: 1.018 (ref 1.005–1.030)
pH: 5 (ref 5.0–8.0)

## 2019-10-14 LAB — PROTIME-INR
INR: 1 (ref 0.8–1.2)
Prothrombin Time: 12.9 seconds (ref 11.4–15.2)

## 2019-10-14 LAB — APTT: aPTT: 26 seconds (ref 24–36)

## 2019-10-14 MED ORDER — CEFAZOLIN SODIUM-DEXTROSE 2-4 GM/100ML-% IV SOLN: 2.0000 g | INTRAVENOUS | Status: DC

## 2019-10-14 MED ORDER — VERAPAMIL HCL 2.5 MG/ML IV SOLN
INTRAVENOUS | Status: AC
  Filled 2019-10-14: qty 2

## 2019-10-14 MED ORDER — LIDOCAINE HCL (PF) 1 % IJ SOLN
INTRAMUSCULAR | Status: AC | PRN
  Administered 2019-10-14: 5 mL

## 2019-10-14 MED ORDER — SODIUM CHLORIDE 0.9 % IV SOLN: INTRAVENOUS | Status: DC

## 2019-10-14 MED ORDER — FENTANYL CITRATE (PF) 100 MCG/2ML IJ SOLN
INTRAMUSCULAR | Status: AC | PRN
  Administered 2019-10-14: 25 ug via INTRAVENOUS

## 2019-10-14 MED ORDER — CHLORHEXIDINE GLUCONATE CLOTH 2 % EX PADS: 6.0000 | MEDICATED_PAD | Freq: Once | CUTANEOUS | Status: DC

## 2019-10-14 MED ORDER — IOHEXOL 300 MG/ML  SOLN
100.0000 mL | Freq: Once | INTRAMUSCULAR | Status: AC | PRN
  Administered 2019-10-14: 30 mL via INTRA_ARTERIAL

## 2019-10-14 MED ORDER — HEPARIN SODIUM (PORCINE) 1000 UNIT/ML IJ SOLN
INTRAMUSCULAR | Status: AC | PRN
  Administered 2019-10-14: 3000 [IU] via INTRAVENOUS

## 2019-10-14 MED ORDER — MIDAZOLAM HCL 2 MG/2ML IJ SOLN
INTRAMUSCULAR | Status: AC
  Filled 2019-10-14: qty 2

## 2019-10-14 MED ORDER — MIDAZOLAM HCL 2 MG/2ML IJ SOLN
INTRAMUSCULAR | Status: AC | PRN
  Administered 2019-10-14: 1 mg via INTRAVENOUS

## 2019-10-14 MED ORDER — LIDOCAINE HCL 1 % IJ SOLN
INTRAMUSCULAR | Status: AC
  Filled 2019-10-14: qty 20

## 2019-10-14 MED ORDER — NITROGLYCERIN 1 MG/10 ML FOR IR/CATH LAB
INTRA_ARTERIAL | Status: AC
  Filled 2019-10-14: qty 10

## 2019-10-14 MED ORDER — HEPARIN SODIUM (PORCINE) 1000 UNIT/ML IJ SOLN
INTRAMUSCULAR | Status: AC
  Filled 2019-10-14: qty 1

## 2019-10-14 MED ORDER — HYDROCODONE-ACETAMINOPHEN 5-325 MG PO TABS: 1.0000 | ORAL_TABLET | ORAL | Status: DC | PRN

## 2019-10-14 MED ORDER — FENTANYL CITRATE (PF) 100 MCG/2ML IJ SOLN
INTRAMUSCULAR | Status: AC
  Filled 2019-10-14: qty 2

## 2019-10-14 NOTE — Brief Op Note (Signed)
  NEUROSURGERY BRIEF OPERATIVE  NOTE   PREOP DX: Aneurysm  POSTOP DX: Same  PROCEDURE: Diagnostic cerebral angiogram  SURGEON: Dr. Consuella Lose, MD  ANESTHESIA: IV Sedation with Local  EBL: Minimal  SPECIMENS: None  COMPLICATIONS: None  CONDITION: Stable to recovery  FINDINGS (Full report in [facility]): 1. Stable position of LICA Pipeline device, no in-stent stenosis. No aneurysm filling is seen.

## 2019-10-14 NOTE — Discharge Instructions (Addendum)
Radial Site Care ° °This sheet gives you information about how to care for yourself after your procedure. Your health care provider may also give you more specific instructions. If you have problems or questions, contact your health care provider. °What can I expect after the procedure? °After the procedure, it is common to have: °· Bruising and tenderness at the catheter insertion area. °Follow these instructions at home: °Medicines °· Take over-the-counter and prescription medicines only as told by your health care provider. °Insertion site care °· Follow instructions from your health care provider about how to take care of your insertion site. Make sure you: °? Wash your hands with soap and water before you change your bandage (dressing). If soap and water are not available, use hand sanitizer. °? Change your dressing as told by your health care provider. °? Leave stitches (sutures), skin glue, or adhesive strips in place. These skin closures may need to stay in place for 2 weeks or longer. If adhesive strip edges start to loosen and curl up, you may trim the loose edges. Do not remove adhesive strips completely unless your health care provider tells you to do that. °· Check your insertion site every day for signs of infection. Check for: °? Redness, swelling, or pain. °? Fluid or blood. °? Pus or a bad smell. °? Warmth. °· Do not take baths, swim, or use a hot tub until your health care provider approves. °· You may shower 24-48 hours after the procedure, or as directed by your health care provider. °? Remove the dressing and gently wash the site with plain soap and water. °? Pat the area dry with a clean towel. °? Do not rub the site. That could cause bleeding. °· Do not apply powder or lotion to the site. °Activity ° °· For 24 hours after the procedure, or as directed by your health care provider: °? Do not flex or bend the affected arm. °? Do not push or pull heavy objects with the affected arm. °? Do not  drive yourself home from the hospital or clinic. You may drive 24 hours after the procedure unless your health care provider tells you not to. °? Do not operate machinery or power tools. °· Do not lift anything that is heavier than 10 lb (4.5 kg), or the limit that you are told, until your health care provider says that it is safe. °· Ask your health care provider when it is okay to: °? Return to work or school. °? Resume usual physical activities or sports. °? Resume sexual activity. °General instructions °· If the catheter site starts to bleed, raise your arm and put firm pressure on the site. If the bleeding does not stop, get help right away. This is a medical emergency. °· If you went home on the same day as your procedure, a responsible adult should be with you for the first 24 hours after you arrive home. °· Keep all follow-up visits as told by your health care provider. This is important. °Contact a health care provider if: °· You have a fever. °· You have redness, swelling, or yellow drainage around your insertion site. °Get help right away if: °· You have unusual pain at the radial site. °· The catheter insertion area swells very fast. °· The insertion area is bleeding, and the bleeding does not stop when you hold steady pressure on the area. °· Your arm or hand becomes pale, cool, tingly, or numb. °These symptoms may represent a serious problem   that is an emergency. Do not wait to see if the symptoms will go away. Get medical help right away. Call your local emergency services (911 in the [place] Do not drive yourself to the hospital. Summary  After the procedure, it is common to have bruising and tenderness at the site.  Follow instructions from your health care provider about how to take care of your radial site wound. Check the wound every day for signs of infection.  Do not lift anything that is heavier than 10 lb (4.5 kg), or the limit that you are told, until your health care provider says  that it is safe. This information is not intended to replace advice given to you by your health care provider. Make sure you discuss any questions you have with your health care provider. Document Released: 12/02/2010 Document Revised: 12/05/2017 Document Reviewed: 12/05/2017 Elsevier Patient Education  2020 [place]. Cerebral Angiogram  A cerebral angiogram is a procedure that is used to examine the blood vessels in the brain and neck. In this procedure, contrast dye is injected through a long, thin tube (catheter) into an artery. X-rays are then taken, which show if there is a blockage or problem in a blood vessel. Tell a health care provider about:  Any allergies you have, including allergies to shellfish, contrast dye, or iodine  All medicines you are taking, including vitamins, herbs, eye drops, creams, and over-the-counter medicines.  Any problems you or family members have had with anesthetic medicines.  Any blood disorders you have.  Any surgeries you have had.  Any medical conditions you have.  Whether you are pregnant or may be pregnant.  Whether you are currently breastfeeding. What are the risks? Generally, this is a safe procedure. However, problems may occur, including:  Damage to surrounding nerves, tissues, or structures.  Blood clot.  Inability to remember what happened (amnesia). This is usually temporary.  Weakness, numbness, speech, or vision problems. This is usually temporary.  Stroke.  Kidney injury.  Bleeding or bruising.  Allergic reaction medicines or dyes.  Infection. What happens before the procedure? Staying hydrated Follow instructions from your health care provider about hydration, which may include:  Up to 2 hours before the procedure - you may continue to drink clear liquids, such as water, clear fruit juice, black coffee, and plain tea. Eating and drinking restrictions Follow instructions from your health care provider about  eating and drinking, which may include:  8 hours before the procedure - stop eating heavy meals or foods such as meat, fried foods, or fatty foods.  6 hours before the procedure - stop eating light meals or foods, such as toast or cereal.  6 hours before the procedure - stop drinking milk or drinks that contain milk.  2 hours before the procedure - stop drinking clear liquids. General instructions  Ask your health care provider about: ? Changing or stopping your regular medicines. This is especially important if you are taking diabetes medicines or blood thinners. ? Taking medicines such as aspirin or ibuprofen. These medicines can thin your blood. Do not take these medicines before your procedure if your health care provider asks you not to.  You may have blood tests done.  Plan to have someone take you home from the hospital or clinic.  If you will be going home right after the procedure, plan to have someone with you for 24 hours. What happens during the procedure?  To reduce your risk of infection: ? Your health care  team will wash or sanitize their hands. ? Your skin will be washed with soap. ? Hair may be removed from the surgical area.  You will lie on your back on an imaging bed with an X-ray machine around you.  Your head will be secured to the bed with a strap or device to help you keep still.  An IV tube will be inserted into one of your veins.  You will be given one or more of the following: ? A medicine to help you relax (sedative). ? A medicine to numb the area (local anesthetic) where the catheter will be inserted. This is usually your groin, leg, or arm.  Your heart rate and other vital signs will be watched carefully. You may have electrodes placed on your chest.  A small cut (incision) will be made where the catheter will be inserted.  The catheter will be inserted into an artery that leads to the head. You may feel slight pressure.  The catheter will be  moved through the body up to your neck and brain. X-ray images will help your health care provider bring the catheter to the correct location.  The dye will be injected into the catheter and will travel to your head or neck area. You may feel a warming or burning sensation or notice a strange taste in your mouth as the dye goes through your system.  Images will be taken to show how the dye flows through the area.  While the images are being taken, you may be given instructions on breathing, swallowing, moving, or talking.  When the images are finished, the catheter will be slowly removed.  Pressure will be applied to the skin to stop any bleeding. A tight bandage (dressing) or seal will be applied to the skin.  Your IV will be removed. The procedure may vary among health care providers and hospitals. What happens after the procedure?  Your blood pressure, heart rate, breathing rate, and blood oxygen level will be monitored until the medicines you were given have worn off.  You will be asked to lie flat for several hours. The arm or leg where the catheter was inserted will need to be kept straight while you are in the recovery room.  The insertion site will be watched for bleeding and you will be checked often.  You will be instructed to drink plenty of fluids. This will help wash the contrast dye out of your system.  Do not drive for 24 hours if you received a sedative.  It is up to you to get the results of your procedure. Ask your health care provider, or the department that is doing the procedure, when your results will be ready. Summary  A cerebral angiogram is a procedure that is used to examine the blood vessels in the brain and neck.  In this procedure, contrast dye is injected through a long, thin tube (catheter) into an artery. X-rays are then taken, which show if there is a blockage or problem in a blood vessel.  You will be given a sedative to help you relax during the  procedure. A local anesthetic will be used to numb the area where the catheter is inserted. You may feel pressure when the catheter is inserted, and you may feel a warm sensation when the dye is injected.  After the procedure, you will be asked to lie flat for several hours. The arm or leg where the catheter was inserted will need to be kept straight  while you are in the recovery room. This information is not intended to replace advice given to you by your health care provider. Make sure you discuss any questions you have with your health care provider. Document Released: 03/16/2014 Document Revised: 10/12/2017 Document Reviewed: 12/04/2016 Elsevier Patient Education  [place]. Moderate Conscious Sedation, Adult, Care After These instructions provide you with information about caring for yourself after your procedure. Your health care provider may also give you more specific instructions. Your treatment has been planned according to current medical practices, but problems sometimes occur. Call your health care provider if you have any problems or questions after your procedure. What can I expect after the procedure? After your procedure, it is common:  To feel sleepy for several hours.  To feel clumsy and have poor balance for several hours.  To have poor judgment for several hours.  To vomit if you eat too soon. Follow these instructions at home: For at least 24 hours after the procedure:   Do not: ? Participate in activities where you could fall or become injured. ? Drive. ? Use heavy machinery. ? Drink alcohol. ? Take sleeping pills or medicines that cause drowsiness. ? Make important decisions or sign legal documents. ? Take care of children on your own.  Rest. Eating and drinking  Follow the diet recommended by your health care provider.  If you vomit: ? Drink water, juice, or soup when you can drink without vomiting. ? Make sure you have little or no nausea  before eating solid foods. General instructions  Have a responsible adult stay with you until you are awake and alert.  Take over-the-counter and prescription medicines only as told by your health care provider.  If you smoke, do not smoke without supervision.  Keep all follow-up visits as told by your health care provider. This is important. Contact a health care provider if:  You keep feeling nauseous or you keep vomiting.  You feel light-headed.  You develop a rash.  You have a fever. Get help right away if:  You have trouble breathing. This information is not intended to replace advice given to you by your health care provider. Make sure you discuss any questions you have with your health care provider. Document Released: 08/20/2013 Document Revised: 10/12/2017 Document Reviewed: 02/19/2016 Elsevier Patient Education  2020 [employer].

## 2019-10-14 NOTE — Progress Notes (Signed)
Discharge instructions reviewed with patient and sister. Verbalized understanding. 

## 2019-10-21 ENCOUNTER — Inpatient Hospital Stay (HOSPITAL_COMMUNITY): Payer: Medicare Other | Attending: Hematology

## 2019-10-21 ENCOUNTER — Other Ambulatory Visit: Payer: Self-pay

## 2019-10-21 DIAGNOSIS — I10 Essential (primary) hypertension: Secondary | ICD-10-CM | POA: Insufficient documentation

## 2019-10-21 DIAGNOSIS — K219 Gastro-esophageal reflux disease without esophagitis: Secondary | ICD-10-CM | POA: Insufficient documentation

## 2019-10-21 DIAGNOSIS — M1990 Unspecified osteoarthritis, unspecified site: Secondary | ICD-10-CM | POA: Diagnosis not present

## 2019-10-21 DIAGNOSIS — Z79899 Other long term (current) drug therapy: Secondary | ICD-10-CM | POA: Insufficient documentation

## 2019-10-21 DIAGNOSIS — D649 Anemia, unspecified: Secondary | ICD-10-CM

## 2019-10-21 DIAGNOSIS — Z87891 Personal history of nicotine dependence: Secondary | ICD-10-CM | POA: Diagnosis not present

## 2019-10-21 DIAGNOSIS — D509 Iron deficiency anemia, unspecified: Secondary | ICD-10-CM | POA: Insufficient documentation

## 2019-10-21 DIAGNOSIS — N189 Chronic kidney disease, unspecified: Secondary | ICD-10-CM | POA: Diagnosis not present

## 2019-10-21 LAB — FOLATE: Folate: 41.4 ng/mL (ref >5.9)

## 2019-10-21 LAB — COMPREHENSIVE METABOLIC PANEL
ALT: 15 U/L (ref 0–44)
AST: 22 U/L (ref 15–41)
Albumin: 4 g/dL (ref 3.5–5.0)
Alkaline Phosphatase: 78 U/L (ref 38–126)
Anion gap: 11 (ref 5–15)
BUN: 18 mg/dL (ref 8–23)
CO2: 25 mmol/L (ref 22–32)
Calcium: 9.7 mg/dL (ref 8.9–10.3)
Chloride: 102 mmol/L (ref 98–111)
Creatinine, Ser: 1.27 mg/dL — ABNORMAL HIGH (ref 0.44–1.00)
GFR calc Af Amer: 50 mL/min — ABNORMAL LOW (ref >60)
GFR calc non Af Amer: 43 mL/min — ABNORMAL LOW (ref >60)
Glucose, Bld: 104 mg/dL — ABNORMAL HIGH (ref 70–99)
Potassium: 3.7 mmol/L (ref 3.5–5.1)
Sodium: 138 mmol/L (ref 135–145)
Total Bilirubin: 0.5 mg/dL (ref 0.3–1.2)
Total Protein: 7.3 g/dL (ref 6.5–8.1)

## 2019-10-21 LAB — CBC WITH DIFFERENTIAL/PLATELET
Abs Immature Granulocytes: 0.03 10*3/uL (ref 0.00–0.07)
Basophils Absolute: 0 10*3/uL (ref 0.0–0.1)
Basophils Relative: 1 %
Eosinophils Absolute: 0.3 10*3/uL (ref 0.0–0.5)
Eosinophils Relative: 5 %
HCT: 30.6 % — ABNORMAL LOW (ref 36.0–46.0)
Hemoglobin: 9.2 g/dL — ABNORMAL LOW (ref 12.0–15.0)
Immature Granulocytes: 1 %
Lymphocytes Relative: 23 %
Lymphs Abs: 1.5 10*3/uL (ref 0.7–4.0)
MCH: 32.2 pg (ref 26.0–34.0)
MCHC: 30.1 g/dL (ref 30.0–36.0)
MCV: 107 fL — ABNORMAL HIGH (ref 80.0–100.0)
Monocytes Absolute: 0.5 10*3/uL (ref 0.1–1.0)
Monocytes Relative: 8 %
Neutro Abs: 4.2 10*3/uL (ref 1.7–7.7)
Neutrophils Relative %: 62 %
Platelets: 338 10*3/uL (ref 150–400)
RBC: 2.86 MIL/uL — ABNORMAL LOW (ref 3.87–5.11)
RDW: 15.5 % (ref 11.5–15.5)
WBC: 6.6 10*3/uL (ref 4.0–10.5)
nRBC: 0 % (ref 0.0–0.2)

## 2019-10-21 LAB — IRON AND TIBC
Iron: 67 ug/dL (ref 28–170)
Saturation Ratios: 21 % (ref 10.4–31.8)
TIBC: 320 ug/dL (ref 250–450)
UIBC: 253 ug/dL

## 2019-10-21 LAB — FERRITIN: Ferritin: 313 ng/mL — ABNORMAL HIGH (ref 11–307)

## 2019-10-21 LAB — VITAMIN B12: Vitamin B-12: 2449 pg/mL — ABNORMAL HIGH (ref 180–914)

## 2019-10-28 ENCOUNTER — Inpatient Hospital Stay (HOSPITAL_BASED_OUTPATIENT_CLINIC_OR_DEPARTMENT_OTHER): Payer: Medicare Other | Admitting: Hematology

## 2019-10-28 ENCOUNTER — Other Ambulatory Visit: Payer: Self-pay

## 2019-10-28 ENCOUNTER — Encounter (HOSPITAL_COMMUNITY): Payer: Self-pay | Admitting: Hematology

## 2019-10-28 VITALS — BP 109/41 | HR 72 | Temp 97.9°F | Resp 20 | Wt 208.2 lb

## 2019-10-28 DIAGNOSIS — D649 Anemia, unspecified: Secondary | ICD-10-CM | POA: Diagnosis not present

## 2019-10-28 DIAGNOSIS — D508 Other iron deficiency anemias: Secondary | ICD-10-CM

## 2019-10-28 DIAGNOSIS — D509 Iron deficiency anemia, unspecified: Secondary | ICD-10-CM | POA: Diagnosis not present

## 2019-10-28 NOTE — Assessment & Plan Note (Addendum)
1.  Normocytic anemia: -Combination anemia from relative iron deficiency state, chronic kidney disease and likely blood loss. -Colonoscopy on 07/24/2017 shows diverticulosis of the sigmoid colon, external hemorrhoids. -Denies any bleeding per rectum or melena. 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 -She was subsequently started on aspirin 325 mg and Plavix 75 mg. -She received Feraheme on 10/01/2019 and 10/08/2019. -She reported improvement in energy in the last 2 weeks. -We reviewed labs from 10/21/2019.  Creatinine improved to 1.27.  Ferritin is 313 with percent saturation of 21.  Folic acid and 123456 are normal.  Hemoglobin was 9.2 with MCV of 107. -Stool for occult blood have been positive on 10/01/2019.  She could be having bleeding from small bowel from antiplatelet agents. -I have recommended close follow-ups with the checking blood and iron levels.  She will more than likely need Feraheme infusions frequently.  If she continues to be anemic with hemoglobin less than 9 despite adequate iron levels, we will consider erythropoiesis stimulating agents like Procrit. -RTC 4 weeks with labs.
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
## 2019-10-28 NOTE — Patient Instructions (Addendum)
[place] at [facility] Discharge Instructions  You were seen today by Dr. Delton Coombes. He went over your recent lab results. All three of your stool cards were positive for blood. He will see you back in 4 weeks for labs and follow up.   Thank you for choosing [place] at [facility] to provide your oncology and hematology care.  To afford each patient quality time with our provider, please arrive at least 15 minutes before your scheduled appointment time.   If you have a lab appointment with the [facility] please come in thru the  Main Entrance and check in at the main information desk  You need to re-schedule your appointment should you arrive 10 or more minutes late.  We strive to give you quality time with our providers, and arriving late affects you and other patients whose appointments are after yours.  Also, if you no show three or more times for appointments you may be dismissed from the clinic at the providers discretion.     Again, thank you for choosing [facility].  Our hope is that these requests will decrease the amount of time that you wait before being seen by our physicians.       _____________________________________________________________  Should you have questions after your visit to [facility], please contact our office at [phone] between the hours of 8:00 a.m. and 4:30 p.m.  Voicemails left after 4:00 p.m. will not be returned until the following business day.  For prescription refill requests, have your pharmacy contact our office and allow 72 hours.    Cancer Center Support Programs:   > Cancer Support Group  2nd Tuesday of the month 1pm-2pm, Journey Room  '

## 2019-11-18 DIAGNOSIS — Z1231 Encounter for screening mammogram for malignant neoplasm of breast: Secondary | ICD-10-CM | POA: Diagnosis not present

## 2019-11-19 ENCOUNTER — Inpatient Hospital Stay (HOSPITAL_COMMUNITY): Payer: Medicare Other | Attending: Hematology

## 2019-11-19 ENCOUNTER — Other Ambulatory Visit: Payer: Self-pay

## 2019-11-19 DIAGNOSIS — M1990 Unspecified osteoarthritis, unspecified site: Secondary | ICD-10-CM | POA: Diagnosis not present

## 2019-11-19 DIAGNOSIS — Z87891 Personal history of nicotine dependence: Secondary | ICD-10-CM | POA: Insufficient documentation

## 2019-11-19 DIAGNOSIS — R200 Anesthesia of skin: Secondary | ICD-10-CM | POA: Diagnosis not present

## 2019-11-19 DIAGNOSIS — N189 Chronic kidney disease, unspecified: Secondary | ICD-10-CM | POA: Diagnosis not present

## 2019-11-19 DIAGNOSIS — D631 Anemia in chronic kidney disease: Secondary | ICD-10-CM | POA: Diagnosis not present

## 2019-11-19 DIAGNOSIS — Z7982 Long term (current) use of aspirin: Secondary | ICD-10-CM | POA: Insufficient documentation

## 2019-11-19 DIAGNOSIS — D509 Iron deficiency anemia, unspecified: Secondary | ICD-10-CM | POA: Insufficient documentation

## 2019-11-19 DIAGNOSIS — Z7951 Long term (current) use of inhaled steroids: Secondary | ICD-10-CM | POA: Insufficient documentation

## 2019-11-19 DIAGNOSIS — Z79899 Other long term (current) drug therapy: Secondary | ICD-10-CM | POA: Diagnosis not present

## 2019-11-19 DIAGNOSIS — R5383 Other fatigue: Secondary | ICD-10-CM | POA: Insufficient documentation

## 2019-11-19 DIAGNOSIS — Z7902 Long term (current) use of antithrombotics/antiplatelets: Secondary | ICD-10-CM | POA: Diagnosis not present

## 2019-11-19 DIAGNOSIS — D649 Anemia, unspecified: Secondary | ICD-10-CM

## 2019-11-19 DIAGNOSIS — K219 Gastro-esophageal reflux disease without esophagitis: Secondary | ICD-10-CM | POA: Insufficient documentation

## 2019-11-19 DIAGNOSIS — F418 Other specified anxiety disorders: Secondary | ICD-10-CM | POA: Insufficient documentation

## 2019-11-19 DIAGNOSIS — I129 Hypertensive chronic kidney disease with stage 1 through stage 4 chronic kidney disease, or unspecified chronic kidney disease: Secondary | ICD-10-CM | POA: Insufficient documentation

## 2019-11-19 LAB — CBC WITH DIFFERENTIAL/PLATELET
Abs Immature Granulocytes: 0.01 10*3/uL (ref 0.00–0.07)
Basophils Absolute: 0.1 10*3/uL (ref 0.0–0.1)
Basophils Relative: 1 %
Eosinophils Absolute: 0.3 10*3/uL (ref 0.0–0.5)
Eosinophils Relative: 4 %
HCT: 32.3 % — ABNORMAL LOW (ref 36.0–46.0)
Hemoglobin: 10.1 g/dL — ABNORMAL LOW (ref 12.0–15.0)
Immature Granulocytes: 0 %
Lymphocytes Relative: 20 %
Lymphs Abs: 1.5 10*3/uL (ref 0.7–4.0)
MCH: 32.2 pg (ref 26.0–34.0)
MCHC: 31.3 g/dL (ref 30.0–36.0)
MCV: 102.9 fL — ABNORMAL HIGH (ref 80.0–100.0)
Monocytes Absolute: 0.7 10*3/uL (ref 0.1–1.0)
Monocytes Relative: 9 %
Neutro Abs: 4.8 10*3/uL (ref 1.7–7.7)
Neutrophils Relative %: 66 %
Platelets: 359 10*3/uL (ref 150–400)
RBC: 3.14 MIL/uL — ABNORMAL LOW (ref 3.87–5.11)
RDW: 15.6 % — ABNORMAL HIGH (ref 11.5–15.5)
WBC: 7.4 10*3/uL (ref 4.0–10.5)
nRBC: 0 % (ref 0.0–0.2)

## 2019-11-19 LAB — COMPREHENSIVE METABOLIC PANEL
ALT: 19 U/L (ref 0–44)
AST: 25 U/L (ref 15–41)
Albumin: 4 g/dL (ref 3.5–5.0)
Alkaline Phosphatase: 70 U/L (ref 38–126)
Anion gap: 11 (ref 5–15)
BUN: 26 mg/dL — ABNORMAL HIGH (ref 8–23)
CO2: 25 mmol/L (ref 22–32)
Calcium: 9.6 mg/dL (ref 8.9–10.3)
Chloride: 101 mmol/L (ref 98–111)
Creatinine, Ser: 1.51 mg/dL — ABNORMAL HIGH (ref 0.44–1.00)
GFR calc Af Amer: 40 mL/min — ABNORMAL LOW (ref >60)
GFR calc non Af Amer: 35 mL/min — ABNORMAL LOW (ref >60)
Glucose, Bld: 114 mg/dL — ABNORMAL HIGH (ref 70–99)
Potassium: 3.2 mmol/L — ABNORMAL LOW (ref 3.5–5.1)
Sodium: 137 mmol/L (ref 135–145)
Total Bilirubin: 0.4 mg/dL (ref 0.3–1.2)
Total Protein: 7.3 g/dL (ref 6.5–8.1)

## 2019-11-19 LAB — FOLATE: Folate: 42.5 ng/mL (ref >5.9)

## 2019-11-19 LAB — VITAMIN B12: Vitamin B-12: 1934 pg/mL — ABNORMAL HIGH (ref 180–914)

## 2019-11-19 LAB — IRON AND TIBC
Iron: 53 ug/dL (ref 28–170)
Saturation Ratios: 15 % (ref 10.4–31.8)
TIBC: 360 ug/dL (ref 250–450)
UIBC: 307 ug/dL

## 2019-11-19 LAB — TSH: TSH: 1.257 u[IU]/mL (ref 0.350–4.500)

## 2019-11-19 LAB — FERRITIN: Ferritin: 47 ng/mL (ref 11–307)

## 2019-11-25 ENCOUNTER — Other Ambulatory Visit: Payer: Self-pay

## 2019-11-25 ENCOUNTER — Inpatient Hospital Stay (HOSPITAL_BASED_OUTPATIENT_CLINIC_OR_DEPARTMENT_OTHER): Payer: Medicare Other | Admitting: Hematology

## 2019-11-25 ENCOUNTER — Encounter (HOSPITAL_COMMUNITY): Payer: Self-pay | Admitting: Hematology

## 2019-11-25 VITALS — BP 106/60 | HR 87 | Temp 97.1°F | Resp 18 | Wt 213.0 lb

## 2019-11-25 DIAGNOSIS — D508 Other iron deficiency anemias: Secondary | ICD-10-CM | POA: Diagnosis not present

## 2019-11-25 DIAGNOSIS — I129 Hypertensive chronic kidney disease with stage 1 through stage 4 chronic kidney disease, or unspecified chronic kidney disease: Secondary | ICD-10-CM | POA: Diagnosis not present

## 2019-11-25 DIAGNOSIS — D649 Anemia, unspecified: Secondary | ICD-10-CM

## 2019-11-25 DIAGNOSIS — R5383 Other fatigue: Secondary | ICD-10-CM | POA: Diagnosis not present

## 2019-11-25 DIAGNOSIS — R200 Anesthesia of skin: Secondary | ICD-10-CM | POA: Diagnosis not present

## 2019-11-25 DIAGNOSIS — D509 Iron deficiency anemia, unspecified: Secondary | ICD-10-CM | POA: Diagnosis not present

## 2019-11-25 DIAGNOSIS — D631 Anemia in chronic kidney disease: Secondary | ICD-10-CM | POA: Diagnosis not present

## 2019-11-25 DIAGNOSIS — N189 Chronic kidney disease, unspecified: Secondary | ICD-10-CM | POA: Diagnosis not present

## 2019-11-25 NOTE — Assessment & Plan Note (Addendum)
1.  Normocytic anemia: -Combination anemia from relative iron deficiency state, chronic kidney disease and likely blood loss. -Colonoscopy on 07/24/2017 shows diverticulosis of the sigmoid colon, external hemorrhoids. -Denies any bleeding per rectum or melena. 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 -She was subsequently started on aspirin 325 mg and Plavix 75 mg. -Last Feraheme was on 10/01/2019 and 10/08/2019.  She is continuing vitamin B12 1 mg daily. -We reviewed her blood work today.  Ferritin decreased to 47 from 313. -Hemoglobin is 10.1.  She complains of feeling tired. -I have recommended 2 more infusions of Feraheme.  We will reevaluate her in 6 weeks. -She has sudden acute drops of ferritin, most likely losing from small bowel.  She is on dual antiplatelet therapy.  Hence she needs to be watched closely. -If her hemoglobin drops below 9 with adequate iron stores, we will consider adding erythropoiesis stimulating agents.  2.  CKD: -Her baseline creatinine is between 1.5 and 1.7.
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
## 2019-11-25 NOTE — Patient Instructions (Addendum)
[place] at [facility] Discharge Instructions  You were seen today by Dr. Delton Coombes. He went over your recent lab results. He will schedule you for 2 doses of IV Iron. He will see you back in 6 weeks for labs and follow up.   Thank you for choosing [place] at [facility] to provide your oncology and hematology care.  To afford each patient quality time with our provider, please arrive at least 15 minutes before your scheduled appointment time.   If you have a lab appointment with the [facility] please come in thru the  Main Entrance and check in at the main information desk  You need to re-schedule your appointment should you arrive 10 or more minutes late.  We strive to give you quality time with our providers, and arriving late affects you and other patients whose appointments are after yours.  Also, if you no show three or more times for appointments you may be dismissed from the clinic at the providers discretion.     Again, thank you for choosing [facility].  Our hope is that these requests will decrease the amount of time that you wait before being seen by our physicians.       _____________________________________________________________  Should you have questions after your visit to [facility], please contact our office at [phone] between the hours of 8:00 a.m. and 4:30 p.m.  Voicemails left after 4:00 p.m. will not be returned until the following business day.  For prescription refill requests, have your pharmacy contact our office and allow 72 hours.    Cancer Center Support Programs:   > Cancer Support Group  2nd Tuesday of the month 1pm-2pm, Journey Room

## 2019-11-28 ENCOUNTER — Inpatient Hospital Stay (HOSPITAL_COMMUNITY): Payer: Medicare Other

## 2019-11-28 ENCOUNTER — Other Ambulatory Visit: Payer: Self-pay

## 2019-11-28 VITALS — BP 115/59 | HR 78 | Temp 97.3°F | Resp 17

## 2019-11-28 DIAGNOSIS — D631 Anemia in chronic kidney disease: Secondary | ICD-10-CM | POA: Diagnosis not present

## 2019-11-28 DIAGNOSIS — R5383 Other fatigue: Secondary | ICD-10-CM | POA: Diagnosis not present

## 2019-11-28 DIAGNOSIS — D509 Iron deficiency anemia, unspecified: Secondary | ICD-10-CM | POA: Diagnosis not present

## 2019-11-28 DIAGNOSIS — I129 Hypertensive chronic kidney disease with stage 1 through stage 4 chronic kidney disease, or unspecified chronic kidney disease: Secondary | ICD-10-CM | POA: Diagnosis not present

## 2019-11-28 DIAGNOSIS — R200 Anesthesia of skin: Secondary | ICD-10-CM | POA: Diagnosis not present

## 2019-11-28 DIAGNOSIS — N189 Chronic kidney disease, unspecified: Secondary | ICD-10-CM | POA: Diagnosis not present

## 2019-11-28 DIAGNOSIS — D649 Anemia, unspecified: Secondary | ICD-10-CM

## 2019-11-28 MED ORDER — SODIUM CHLORIDE 0.9 % IV SOLN: Freq: Once | INTRAVENOUS | Status: AC

## 2019-11-28 MED ORDER — SODIUM CHLORIDE 0.9 % IV SOLN
510.0000 mg | Freq: Once | INTRAVENOUS | Status: AC
  Administered 2019-11-28: 510 mg via INTRAVENOUS
  Filled 2019-11-28: qty 510

## 2019-11-28 NOTE — Patient Instructions (Signed)
[facility] at [facility]  Discharge Instructions:   _______________________________________________________________  Thank you for choosing [facility] at [facility] to provide your oncology and hematology care.  To afford each patient quality time with our providers, please arrive at least 15 minutes before your scheduled appointment.  You need to re-schedule your appointment if you arrive 10 or more minutes late.  We strive to give you quality time with our providers, and arriving late affects you and other patients whose appointments are after yours.  Also, if you no show three or more times for appointments you may be dismissed from the clinic.  Again, thank you for choosing [facility] at [facility]. Our hope is that these requests will allow you access to exceptional care and in a timely manner. _______________________________________________________________  If you have questions after your visit, please contact our office at [phone] between the hours of 8:30 a.m. and 5:00 p.m. Voicemails left after 4:30 p.m. will not be returned until the following business day. _______________________________________________________________  For prescription refill requests, have your pharmacy contact our office. _______________________________________________________________  Recommendations made by the consultant and any test results will be sent to your referring physician. _______________________________________________________________ 

## 2019-11-28 NOTE — Progress Notes (Signed)
Patient presents today for Feraheme infusion. No complaints of any changes since her last visit. MAR reviewed and updated. Vital signs stable.   Feraheme given today per MD orders. Tolerated infusion without adverse affects. Vital signs stable. No complaints at this time. Discharged from clinic ambulatory. F/U with [facility] as scheduled.

## 2019-12-03 ENCOUNTER — Ambulatory Visit: Payer: Medicare Other | Attending: Internal Medicine

## 2019-12-03 DIAGNOSIS — Z23 Encounter for immunization: Secondary | ICD-10-CM | POA: Insufficient documentation

## 2019-12-03 NOTE — Progress Notes (Signed)
   Covid-19 Vaccination Clinic  Name:  Candice [name]    MRN: [id number] DOB: [birth year]  12/03/2019  [name] was observed post Covid-19 immunization for 15 minutes without incidence. She was provided with Vaccine Information Sheet and instruction to access the V-Safe system.   [name] was instructed to call 911 with any severe reactions post vaccine: Marland Kitchen Difficulty breathing  . Swelling of your face and throat  . A fast heartbeat  . A bad rash all over your body  . Dizziness and weakness    Immunizations Administered    Name Date Dose VIS Date Route   Pfizer COVID-19 Vaccine 12/03/2019  3:47 PM 0.3 mL 10/24/2019 Intramuscular   Manufacturer: [place]   Lot: [id number]   [place]: [id number]

## 2019-12-05 ENCOUNTER — Other Ambulatory Visit: Payer: Self-pay

## 2019-12-05 ENCOUNTER — Inpatient Hospital Stay (HOSPITAL_COMMUNITY): Payer: Medicare Other

## 2019-12-05 VITALS — BP 113/56 | HR 78 | Temp 96.9°F | Resp 18

## 2019-12-05 DIAGNOSIS — D649 Anemia, unspecified: Secondary | ICD-10-CM

## 2019-12-05 DIAGNOSIS — D631 Anemia in chronic kidney disease: Secondary | ICD-10-CM | POA: Diagnosis not present

## 2019-12-05 DIAGNOSIS — R200 Anesthesia of skin: Secondary | ICD-10-CM | POA: Diagnosis not present

## 2019-12-05 DIAGNOSIS — N189 Chronic kidney disease, unspecified: Secondary | ICD-10-CM | POA: Diagnosis not present

## 2019-12-05 DIAGNOSIS — I129 Hypertensive chronic kidney disease with stage 1 through stage 4 chronic kidney disease, or unspecified chronic kidney disease: Secondary | ICD-10-CM | POA: Diagnosis not present

## 2019-12-05 DIAGNOSIS — D509 Iron deficiency anemia, unspecified: Secondary | ICD-10-CM | POA: Diagnosis not present

## 2019-12-05 DIAGNOSIS — R5383 Other fatigue: Secondary | ICD-10-CM | POA: Diagnosis not present

## 2019-12-05 MED ORDER — SODIUM CHLORIDE 0.9 % IV SOLN: Freq: Once | INTRAVENOUS | Status: AC

## 2019-12-05 MED ORDER — SODIUM CHLORIDE 0.9 % IV SOLN
510.0000 mg | Freq: Once | INTRAVENOUS | Status: AC
  Administered 2019-12-05: 510 mg via INTRAVENOUS
  Filled 2019-12-05: qty 510

## 2019-12-05 NOTE — Progress Notes (Signed)
Patient presents today for Feraheme. Vital signs stable. MAR reviewed and updated. Patient has no complaints of any pain today or changes since her last visit.   Feraheme given today per MD orders. Tolerated infusion without adverse affects. Vital signs stable. No complaints at this time. Discharged from clinic ambulatory. F/U with [facility] as scheduled.

## 2019-12-11 DIAGNOSIS — I671 Cerebral aneurysm, nonruptured: Secondary | ICD-10-CM | POA: Diagnosis not present

## 2019-12-24 ENCOUNTER — Ambulatory Visit: Payer: Medicare Other | Attending: Internal Medicine

## 2019-12-24 DIAGNOSIS — Z23 Encounter for immunization: Secondary | ICD-10-CM | POA: Insufficient documentation

## 2019-12-24 NOTE — Progress Notes (Signed)
   Covid-19 Vaccination Clinic  Name:  AARON BRISON    MRN: [id number] DOB: 12-01-1950  12/24/2019  Ms. Lango was observed post Covid-19 immunization for 15 minutes without incidence. She was provided with Vaccine Information Sheet and instruction to access the V-Safe system.   Ms. Dove was instructed to call 911 with any severe reactions post vaccine: Marland Kitchen Difficulty breathing  . Swelling of your face and throat  . A fast heartbeat  . A bad rash all over your body  . Dizziness and weakness    Immunizations Administered    Name Date Dose VIS Date Route   Pfizer COVID-19 Vaccine 12/24/2019  9:42 AM 0.3 mL 10/24/2019 Intramuscular   Manufacturer: [place]   Lot: [id number]   [place]: [id number]

## 2019-12-31 ENCOUNTER — Inpatient Hospital Stay (HOSPITAL_COMMUNITY): Payer: Medicare Other | Attending: Hematology

## 2019-12-31 ENCOUNTER — Other Ambulatory Visit: Payer: Self-pay

## 2019-12-31 DIAGNOSIS — D631 Anemia in chronic kidney disease: Secondary | ICD-10-CM | POA: Insufficient documentation

## 2019-12-31 DIAGNOSIS — D509 Iron deficiency anemia, unspecified: Secondary | ICD-10-CM | POA: Diagnosis not present

## 2019-12-31 DIAGNOSIS — Z7982 Long term (current) use of aspirin: Secondary | ICD-10-CM | POA: Diagnosis not present

## 2019-12-31 DIAGNOSIS — N189 Chronic kidney disease, unspecified: Secondary | ICD-10-CM | POA: Insufficient documentation

## 2019-12-31 DIAGNOSIS — D649 Anemia, unspecified: Secondary | ICD-10-CM

## 2019-12-31 LAB — COMPREHENSIVE METABOLIC PANEL
ALT: 22 U/L (ref 0–44)
AST: 22 U/L (ref 15–41)
Albumin: 4 g/dL (ref 3.5–5.0)
Alkaline Phosphatase: 74 U/L (ref 38–126)
Anion gap: 9 (ref 5–15)
BUN: 20 mg/dL (ref 8–23)
CO2: 29 mmol/L (ref 22–32)
Calcium: 9.7 mg/dL (ref 8.9–10.3)
Chloride: 101 mmol/L (ref 98–111)
Creatinine, Ser: 1.14 mg/dL — ABNORMAL HIGH (ref 0.44–1.00)
GFR calc Af Amer: 56 mL/min — ABNORMAL LOW (ref >60)
GFR calc non Af Amer: 49 mL/min — ABNORMAL LOW (ref >60)
Glucose, Bld: 101 mg/dL — ABNORMAL HIGH (ref 70–99)
Potassium: 3.6 mmol/L (ref 3.5–5.1)
Sodium: 139 mmol/L (ref 135–145)
Total Bilirubin: 0.5 mg/dL (ref 0.3–1.2)
Total Protein: 7.3 g/dL (ref 6.5–8.1)

## 2019-12-31 LAB — CBC WITH DIFFERENTIAL/PLATELET
Abs Immature Granulocytes: 0.02 10*3/uL (ref 0.00–0.07)
Basophils Absolute: 0 10*3/uL (ref 0.0–0.1)
Basophils Relative: 1 %
Eosinophils Absolute: 0.4 10*3/uL (ref 0.0–0.5)
Eosinophils Relative: 6 %
HCT: 33.3 % — ABNORMAL LOW (ref 36.0–46.0)
Hemoglobin: 10.3 g/dL — ABNORMAL LOW (ref 12.0–15.0)
Immature Granulocytes: 0 %
Lymphocytes Relative: 22 %
Lymphs Abs: 1.3 10*3/uL (ref 0.7–4.0)
MCH: 31.8 pg (ref 26.0–34.0)
MCHC: 30.9 g/dL (ref 30.0–36.0)
MCV: 102.8 fL — ABNORMAL HIGH (ref 80.0–100.0)
Monocytes Absolute: 0.5 10*3/uL (ref 0.1–1.0)
Monocytes Relative: 8 %
Neutro Abs: 3.8 10*3/uL (ref 1.7–7.7)
Neutrophils Relative %: 63 %
Platelets: 311 10*3/uL (ref 150–400)
RBC: 3.24 MIL/uL — ABNORMAL LOW (ref 3.87–5.11)
RDW: 15.7 % — ABNORMAL HIGH (ref 11.5–15.5)
WBC: 6 10*3/uL (ref 4.0–10.5)
nRBC: 0 % (ref 0.0–0.2)

## 2019-12-31 LAB — IRON AND TIBC
Iron: 71 ug/dL (ref 28–170)
Saturation Ratios: 22 % (ref 10.4–31.8)
TIBC: 330 ug/dL (ref 250–450)
UIBC: 259 ug/dL

## 2019-12-31 LAB — FOLATE: Folate: 19.8 ng/mL (ref >5.9)

## 2019-12-31 LAB — VITAMIN B12: Vitamin B-12: 2208 pg/mL — ABNORMAL HIGH (ref 180–914)

## 2019-12-31 LAB — FERRITIN: Ferritin: 147 ng/mL (ref 11–307)

## 2020-01-07 ENCOUNTER — Other Ambulatory Visit: Payer: Self-pay

## 2020-01-07 ENCOUNTER — Inpatient Hospital Stay (HOSPITAL_BASED_OUTPATIENT_CLINIC_OR_DEPARTMENT_OTHER): Payer: Medicare Other | Admitting: Hematology

## 2020-01-07 ENCOUNTER — Encounter (HOSPITAL_COMMUNITY): Payer: Self-pay | Admitting: Hematology

## 2020-01-07 DIAGNOSIS — D631 Anemia in chronic kidney disease: Secondary | ICD-10-CM | POA: Diagnosis not present

## 2020-01-07 DIAGNOSIS — N189 Chronic kidney disease, unspecified: Secondary | ICD-10-CM

## 2020-01-07 DIAGNOSIS — D509 Iron deficiency anemia, unspecified: Secondary | ICD-10-CM

## 2020-01-07 DIAGNOSIS — D649 Anemia, unspecified: Secondary | ICD-10-CM

## 2020-01-07 NOTE — Progress Notes (Signed)
Virtual Visit via Telephone Note  I connected with Candice [name] on 01/07/20 at  3:05 PM EST by telephone and verified that I am speaking with the correct person using two identifiers.   I discussed the limitations, risks, security and privacy concerns of performing an evaluation and management service by telephone and the availability of in person appointments. I also discussed with the patient that there may be a patient responsible charge related to this service. The patient expressed understanding and agreed to proceed.   History of Present Illness: She is seen in our clinic for normocytic anemia. She has a combination anemia from relative iron deficiency state, CKD and likely blood loss from dual antiplatelet agents. She underwent pipeline embolization of the LICA aneurysm on A999333 and was started on aspirin and Plavix. Since then she was receiving intermittent Feraheme infusions. She had sudden acute drops of ferritin most likely losing blood from small bowel.   Observations/Objective: She reports improved energy levels of 75%. Appetite is 100%. Her last iron infusion was on 12/05/2019. Denies any bleeding per rectum or melena. She reports that Plavix was discontinued by her neurosurgeon about 3 weeks ago. She is still taking aspirin 325 mg daily. Denies any GI symptoms including nausea vomiting or diarrhea.  Assessment and Plan:  1. Iron deficiency anemia: -This is from combination of CKD and iron deficiency. -Last Feraheme was on 12/05/2019. -We reviewed blood work from 12/31/2019. Hemoglobin was 10.3. Ferritin was 147, improved from 47. Saturations are 22%, up from 15%. B12 was normal. -Plavix was discontinued 3 weeks ago. She is continuing to take aspirin 325 mg daily. -She denies any bleeding per rectum or melena. -I plan to repeat her CBC, ferritin and iron panel in 6 weeks. She reports good energy levels at this time. If they continue to stay stable, we will spread out her  follow-up visits.  2. CKD: -Her usual baseline creatinine is between 1.5-1.7. However the last labs show her creatinine improved to 1.14.   Follow Up Instructions: RTC 6 weeks with labs.   I discussed the assessment and treatment plan with the patient. The patient was provided an opportunity to ask questions and all were answered. The patient agreed with the plan and demonstrated an understanding of the instructions.   The patient was advised to call back or seek an in-person evaluation if the symptoms worsen or if the condition fails to improve as anticipated.  I provided 11 minutes of non-face-to-face time during this encounter.   Derek Jack, MD

## 2020-01-21 DIAGNOSIS — F331 Major depressive disorder, recurrent, moderate: Secondary | ICD-10-CM | POA: Diagnosis not present

## 2020-01-21 DIAGNOSIS — R739 Hyperglycemia, unspecified: Secondary | ICD-10-CM | POA: Diagnosis not present

## 2020-01-21 DIAGNOSIS — Z1389 Encounter for screening for other disorder: Secondary | ICD-10-CM | POA: Diagnosis not present

## 2020-01-21 DIAGNOSIS — J45909 Unspecified asthma, uncomplicated: Secondary | ICD-10-CM | POA: Diagnosis not present

## 2020-01-21 DIAGNOSIS — F411 Generalized anxiety disorder: Secondary | ICD-10-CM | POA: Diagnosis not present

## 2020-01-21 DIAGNOSIS — Z0000 Encounter for general adult medical examination without abnormal findings: Secondary | ICD-10-CM | POA: Diagnosis not present

## 2020-01-21 DIAGNOSIS — E782 Mixed hyperlipidemia: Secondary | ICD-10-CM | POA: Diagnosis not present

## 2020-01-21 DIAGNOSIS — Z6834 Body mass index (BMI) 34.0-34.9, adult: Secondary | ICD-10-CM | POA: Diagnosis not present

## 2020-01-21 DIAGNOSIS — Z1331 Encounter for screening for depression: Secondary | ICD-10-CM | POA: Diagnosis not present

## 2020-01-21 DIAGNOSIS — K2100 Gastro-esophageal reflux disease with esophagitis, without bleeding: Secondary | ICD-10-CM | POA: Diagnosis not present

## 2020-01-21 DIAGNOSIS — I10 Essential (primary) hypertension: Secondary | ICD-10-CM | POA: Diagnosis not present

## 2020-01-21 DIAGNOSIS — D508 Other iron deficiency anemias: Secondary | ICD-10-CM | POA: Diagnosis not present

## 2020-02-05 DIAGNOSIS — E041 Nontoxic single thyroid nodule: Secondary | ICD-10-CM | POA: Diagnosis not present

## 2020-02-05 DIAGNOSIS — E042 Nontoxic multinodular goiter: Secondary | ICD-10-CM | POA: Diagnosis not present

## 2020-02-11 DIAGNOSIS — I10 Essential (primary) hypertension: Secondary | ICD-10-CM | POA: Diagnosis not present

## 2020-02-11 DIAGNOSIS — F331 Major depressive disorder, recurrent, moderate: Secondary | ICD-10-CM | POA: Diagnosis not present

## 2020-02-11 DIAGNOSIS — E7849 Other hyperlipidemia: Secondary | ICD-10-CM | POA: Diagnosis not present

## 2020-02-12 ENCOUNTER — Other Ambulatory Visit: Payer: Self-pay

## 2020-02-12 ENCOUNTER — Inpatient Hospital Stay (HOSPITAL_COMMUNITY): Payer: Medicare Other | Attending: Hematology

## 2020-02-12 DIAGNOSIS — Z7982 Long term (current) use of aspirin: Secondary | ICD-10-CM | POA: Diagnosis not present

## 2020-02-12 DIAGNOSIS — N189 Chronic kidney disease, unspecified: Secondary | ICD-10-CM | POA: Insufficient documentation

## 2020-02-12 DIAGNOSIS — Z87891 Personal history of nicotine dependence: Secondary | ICD-10-CM | POA: Insufficient documentation

## 2020-02-12 DIAGNOSIS — Z79899 Other long term (current) drug therapy: Secondary | ICD-10-CM | POA: Insufficient documentation

## 2020-02-12 DIAGNOSIS — F329 Major depressive disorder, single episode, unspecified: Secondary | ICD-10-CM | POA: Diagnosis not present

## 2020-02-12 DIAGNOSIS — Z7951 Long term (current) use of inhaled steroids: Secondary | ICD-10-CM | POA: Diagnosis not present

## 2020-02-12 DIAGNOSIS — J45909 Unspecified asthma, uncomplicated: Secondary | ICD-10-CM | POA: Insufficient documentation

## 2020-02-12 DIAGNOSIS — K219 Gastro-esophageal reflux disease without esophagitis: Secondary | ICD-10-CM | POA: Insufficient documentation

## 2020-02-12 DIAGNOSIS — F419 Anxiety disorder, unspecified: Secondary | ICD-10-CM | POA: Insufficient documentation

## 2020-02-12 DIAGNOSIS — Z7902 Long term (current) use of antithrombotics/antiplatelets: Secondary | ICD-10-CM | POA: Insufficient documentation

## 2020-02-12 DIAGNOSIS — I10 Essential (primary) hypertension: Secondary | ICD-10-CM | POA: Diagnosis not present

## 2020-02-12 DIAGNOSIS — D649 Anemia, unspecified: Secondary | ICD-10-CM

## 2020-02-12 DIAGNOSIS — D509 Iron deficiency anemia, unspecified: Secondary | ICD-10-CM | POA: Insufficient documentation

## 2020-02-12 LAB — IRON AND TIBC
Iron: 50 ug/dL (ref 28–170)
Saturation Ratios: 12 % (ref 10.4–31.8)
TIBC: 405 ug/dL (ref 250–450)
UIBC: 355 ug/dL

## 2020-02-12 LAB — CBC WITH DIFFERENTIAL/PLATELET
Abs Immature Granulocytes: 0.02 10*3/uL (ref 0.00–0.07)
Basophils Absolute: 0.1 10*3/uL (ref 0.0–0.1)
Basophils Relative: 1 %
Eosinophils Absolute: 0.2 10*3/uL (ref 0.0–0.5)
Eosinophils Relative: 3 %
HCT: 31.5 % — ABNORMAL LOW (ref 36.0–46.0)
Hemoglobin: 9.6 g/dL — ABNORMAL LOW (ref 12.0–15.0)
Immature Granulocytes: 0 %
Lymphocytes Relative: 18 %
Lymphs Abs: 1.2 10*3/uL (ref 0.7–4.0)
MCH: 30.3 pg (ref 26.0–34.0)
MCHC: 30.5 g/dL (ref 30.0–36.0)
MCV: 99.4 fL (ref 80.0–100.0)
Monocytes Absolute: 0.6 10*3/uL (ref 0.1–1.0)
Monocytes Relative: 8 %
Neutro Abs: 4.6 10*3/uL (ref 1.7–7.7)
Neutrophils Relative %: 70 %
Platelets: 350 10*3/uL (ref 150–400)
RBC: 3.17 MIL/uL — ABNORMAL LOW (ref 3.87–5.11)
RDW: 15.9 % — ABNORMAL HIGH (ref 11.5–15.5)
WBC: 6.6 10*3/uL (ref 4.0–10.5)
nRBC: 0 % (ref 0.0–0.2)

## 2020-02-12 LAB — FERRITIN: Ferritin: 13 ng/mL (ref 11–307)

## 2020-02-19 ENCOUNTER — Encounter (HOSPITAL_COMMUNITY): Payer: Self-pay | Admitting: Hematology

## 2020-02-19 ENCOUNTER — Other Ambulatory Visit: Payer: Self-pay

## 2020-02-19 ENCOUNTER — Inpatient Hospital Stay (HOSPITAL_BASED_OUTPATIENT_CLINIC_OR_DEPARTMENT_OTHER): Payer: Medicare Other | Admitting: Hematology

## 2020-02-19 VITALS — BP 115/58 | HR 91 | Temp 97.6°F | Resp 20 | Wt 205.0 lb

## 2020-02-19 DIAGNOSIS — D649 Anemia, unspecified: Secondary | ICD-10-CM | POA: Diagnosis not present

## 2020-02-19 DIAGNOSIS — D508 Other iron deficiency anemias: Secondary | ICD-10-CM

## 2020-02-19 DIAGNOSIS — F419 Anxiety disorder, unspecified: Secondary | ICD-10-CM | POA: Diagnosis not present

## 2020-02-19 DIAGNOSIS — F329 Major depressive disorder, single episode, unspecified: Secondary | ICD-10-CM | POA: Diagnosis not present

## 2020-02-19 DIAGNOSIS — N189 Chronic kidney disease, unspecified: Secondary | ICD-10-CM | POA: Diagnosis not present

## 2020-02-19 DIAGNOSIS — J45909 Unspecified asthma, uncomplicated: Secondary | ICD-10-CM | POA: Diagnosis not present

## 2020-02-19 DIAGNOSIS — I10 Essential (primary) hypertension: Secondary | ICD-10-CM | POA: Diagnosis not present

## 2020-02-19 DIAGNOSIS — D509 Iron deficiency anemia, unspecified: Secondary | ICD-10-CM | POA: Diagnosis not present

## 2020-02-19 NOTE — Assessment & Plan Note (Signed)
1.  Normocytic anemia: -Combination anemia from relative iron deficiency state, chronic kidney disease and likely blood loss. -Colonoscopy on 07/24/2017 shows diverticulosis of the sigmoid colon, external hemorrhoids. -Stool for occult blood x3 in November 2020. 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 -She was subsequently started on aspirin 325 mg and Plavix 75 mg.  Plavix has been been discontinued recently. -Last Feraheme was on 11/28/2019 on 12/05/2019. -We reviewed her labs.  Ferritin is down to 13 from 147.  Hemoglobin down to 9.6 from 10.3.  Percent saturation is 12 from 22. -Based on these labs, I have recommended Feraheme x2. -She is likely bleeding from small bowel.  This was accentuated by recent dual antiplatelet therapy.  She was requiring Feraheme infusions on an average every 2 months. -I have recommended follow-up in 2 months with repeat labs.  2.  CKD: -She has CKD with baseline creatinine between 1.2 and 1.5.

## 2020-02-19 NOTE — Patient Instructions (Addendum)
[place] at [facility] Discharge Instructions  You were seen today by Dr. Delton Coombes. He went over your recent lab results. Your iron levels are low today.  We will schedule you for IV iron.  He will see you back in 2 months for labs and follow up.   Thank you for choosing [place] at [facility] to provide your oncology and hematology care.  To afford each patient quality time with our provider, please arrive at least 15 minutes before your scheduled appointment time.   If you have a lab appointment with the [place] please come in thru the  Main Entrance and check in at the main information desk  You need to re-schedule your appointment should you arrive 10 or more minutes late.  We strive to give you quality time with our providers, and arriving late affects you and other patients whose appointments are after yours.  Also, if you no show three or more times for appointments you may be dismissed from the clinic at the providers discretion.     Again, thank you for choosing [facility].  Our hope is that these requests will decrease the amount of time that you wait before being seen by our physicians.       _____________________________________________________________  Should you have questions after your visit to [facility], please contact our office at [phone] between the hours of 8:00 a.m. and 4:30 p.m.  Voicemails left after 4:00 p.m. will not be returned until the following business day.  For prescription refill requests, have your pharmacy contact our office and allow 72 hours.    Cancer Center Support Programs:   > Cancer Support Group  2nd Tuesday of the month 1pm-2pm, Journey Room

## 2020-02-19 NOTE — Progress Notes (Signed)
Patient has been assessed, vital signs and labs have been reviewed by Dr. Delton Coombes. Iron levels are low today.  Please give 2 infusions of iron today per Dr. Delton Coombes.
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
## 2020-02-23 ENCOUNTER — Other Ambulatory Visit: Payer: Self-pay

## 2020-02-23 ENCOUNTER — Encounter (HOSPITAL_COMMUNITY): Payer: Self-pay

## 2020-02-23 ENCOUNTER — Inpatient Hospital Stay (HOSPITAL_COMMUNITY): Payer: Medicare Other

## 2020-02-23 VITALS — BP 108/57 | HR 80 | Temp 97.7°F | Resp 18

## 2020-02-23 DIAGNOSIS — N189 Chronic kidney disease, unspecified: Secondary | ICD-10-CM | POA: Diagnosis not present

## 2020-02-23 DIAGNOSIS — D508 Other iron deficiency anemias: Secondary | ICD-10-CM

## 2020-02-23 DIAGNOSIS — D509 Iron deficiency anemia, unspecified: Secondary | ICD-10-CM | POA: Diagnosis not present

## 2020-02-23 DIAGNOSIS — F419 Anxiety disorder, unspecified: Secondary | ICD-10-CM | POA: Diagnosis not present

## 2020-02-23 DIAGNOSIS — D649 Anemia, unspecified: Secondary | ICD-10-CM

## 2020-02-23 DIAGNOSIS — F329 Major depressive disorder, single episode, unspecified: Secondary | ICD-10-CM | POA: Diagnosis not present

## 2020-02-23 DIAGNOSIS — J45909 Unspecified asthma, uncomplicated: Secondary | ICD-10-CM | POA: Diagnosis not present

## 2020-02-23 DIAGNOSIS — I10 Essential (primary) hypertension: Secondary | ICD-10-CM | POA: Diagnosis not present

## 2020-02-23 MED ORDER — SODIUM CHLORIDE 0.9 % IV SOLN: Freq: Once | INTRAVENOUS | Status: AC

## 2020-02-23 MED ORDER — SODIUM CHLORIDE 0.9 % IV SOLN
510.0000 mg | Freq: Once | INTRAVENOUS | Status: AC
  Administered 2020-02-23: 510 mg via INTRAVENOUS
  Filled 2020-02-23: qty 510

## 2020-02-23 NOTE — Progress Notes (Signed)
Patient presents today for Feraheme infusion. Vital signs stable. Patient has no complaints of any pain today. Patient has no complaints of any significant changes since her last visit. MAR reviewed and updated.   Feraheme given today per MD orders. Tolerated infusion without adverse affects. Vital signs stable. No complaints at this time. Discharged from clinic ambulatory. F/U with [facility] as scheduled.

## 2020-02-23 NOTE — Patient Instructions (Signed)
[facility] at [facility]  Discharge Instructions:   _______________________________________________________________  Thank you for choosing [facility] at [facility] to provide your oncology and hematology care.  To afford each patient quality time with our providers, please arrive at least 15 minutes before your scheduled appointment.  You need to re-schedule your appointment if you arrive 10 or more minutes late.  We strive to give you quality time with our providers, and arriving late affects you and other patients whose appointments are after yours.  Also, if you no show three or more times for appointments you may be dismissed from the clinic.  Again, thank you for choosing [facility] at [facility]. Our hope is that these requests will allow you access to exceptional care and in a timely manner. _______________________________________________________________  If you have questions after your visit, please contact our office at [phone] between the hours of 8:30 a.m. and 5:00 p.m. Voicemails left after 4:30 p.m. will not be returned until the following business day. _______________________________________________________________  For prescription refill requests, have your pharmacy contact our office. _______________________________________________________________  Recommendations made by the consultant and any test results will be sent to your referring physician. _______________________________________________________________ 

## 2020-02-26 DIAGNOSIS — R944 Abnormal results of kidney function studies: Secondary | ICD-10-CM | POA: Diagnosis not present

## 2020-03-01 ENCOUNTER — Inpatient Hospital Stay (HOSPITAL_COMMUNITY): Payer: Medicare Other

## 2020-03-01 ENCOUNTER — Other Ambulatory Visit: Payer: Self-pay

## 2020-03-01 VITALS — BP 132/66 | HR 72 | Temp 97.7°F | Resp 18

## 2020-03-01 DIAGNOSIS — N189 Chronic kidney disease, unspecified: Secondary | ICD-10-CM | POA: Diagnosis not present

## 2020-03-01 DIAGNOSIS — J45909 Unspecified asthma, uncomplicated: Secondary | ICD-10-CM | POA: Diagnosis not present

## 2020-03-01 DIAGNOSIS — D508 Other iron deficiency anemias: Secondary | ICD-10-CM

## 2020-03-01 DIAGNOSIS — F419 Anxiety disorder, unspecified: Secondary | ICD-10-CM | POA: Diagnosis not present

## 2020-03-01 DIAGNOSIS — D509 Iron deficiency anemia, unspecified: Secondary | ICD-10-CM | POA: Diagnosis not present

## 2020-03-01 DIAGNOSIS — D649 Anemia, unspecified: Secondary | ICD-10-CM

## 2020-03-01 DIAGNOSIS — F329 Major depressive disorder, single episode, unspecified: Secondary | ICD-10-CM | POA: Diagnosis not present

## 2020-03-01 DIAGNOSIS — I10 Essential (primary) hypertension: Secondary | ICD-10-CM | POA: Diagnosis not present

## 2020-03-01 MED ORDER — SODIUM CHLORIDE 0.9 % IV SOLN
510.0000 mg | Freq: Once | INTRAVENOUS | Status: AC
  Administered 2020-03-01: 510 mg via INTRAVENOUS
  Filled 2020-03-01: qty 510

## 2020-03-01 MED ORDER — SODIUM CHLORIDE 0.9 % IV SOLN: Freq: Once | INTRAVENOUS | Status: AC

## 2020-03-01 NOTE — Patient Instructions (Signed)
[facility] at [facility]  Discharge Instructions:  Ferumoxytol injection What is this medicine? FERUMOXYTOL is an iron complex. Iron is used to make healthy red blood cells, which carry oxygen and nutrients throughout the body. This medicine is used to treat iron deficiency anemia. This medicine may be used for other purposes; ask your health care provider or pharmacist if you have questions. COMMON BRAND NAME(S): Feraheme What should I tell my health care provider before I take this medicine? They need to know if you have any of these conditions:  anemia not caused by low iron levels  high levels of iron in the blood  magnetic resonance imaging (MRI) test scheduled  an unusual or allergic reaction to iron, other medicines, foods, dyes, or preservatives  pregnant or trying to get pregnant  breast-feeding How should I use this medicine? This medicine is for injection into a vein. It is given by a health care professional in a hospital or clinic setting. Talk to your pediatrician regarding the use of this medicine in children. Special care may be needed. Overdosage: If you think you have taken too much of this medicine contact a poison control center or emergency room at once. NOTE: This medicine is only for you. Do not share this medicine with others. What if I miss a dose? It is important not to miss your dose. Call your doctor or health care professional if you are unable to keep an appointment. What may interact with this medicine? This medicine may interact with the following medications:  other iron products This list may not describe all possible interactions. Give your health care provider a list of all the medicines, herbs, non-prescription drugs, or dietary supplements you use. Also tell them if you smoke, drink alcohol, or use illegal drugs. Some items may interact with your medicine. What should I watch for while using this medicine? Visit your  doctor or healthcare professional regularly. Tell your doctor or healthcare professional if your symptoms do not start to get better or if they get worse. You may need blood work done while you are taking this medicine. You may need to follow a special diet. Talk to your doctor. Foods that contain iron include: whole grains/cereals, dried fruits, beans, or peas, leafy green vegetables, and organ meats (liver, kidney). What side effects may I notice from receiving this medicine? Side effects that you should report to your doctor or health care professional as soon as possible:  allergic reactions like skin rash, itching or hives, swelling of the face, lips, or tongue  breathing problems  changes in blood pressure  feeling faint or lightheaded, falls  fever or chills  flushing, sweating, or hot feelings  swelling of the ankles or feet Side effects that usually do not require medical attention (report to your doctor or health care professional if they continue or are bothersome):  diarrhea  headache  nausea, vomiting  stomach pain This list may not describe all possible side effects. Call your doctor for medical advice about side effects. You may report side effects to FDA at 1-800-FDA-1088. Where should I keep my medicine? This drug is given in a hospital or clinic and will not be stored at home. NOTE: This sheet is a summary. It may not cover all possible information. If you have questions about this medicine, talk to your doctor, pharmacist, or health care provider.  2020 Elsevier/Gold Standard (2016-12-18 20:21:10)  _____________________________________________________________  Thank you for choosing [facility] at   [facility] to provide your oncology and hematology care.  To afford each patient quality time with our providers, please arrive at least 15 minutes before your scheduled appointment.  You need to re-schedule your appointment if you arrive 10 or  more minutes late.  We strive to give you quality time with our providers, and arriving late affects you and other patients whose appointments are after yours.  Also, if you no show three or more times for appointments you may be dismissed from the clinic.  Again, thank you for choosing [facility] at [facility]. Our hope is that these requests will allow you access to exceptional care and in a timely manner. _______________________________________________________________  If you have questions after your visit, please contact our office at [phone] between the hours of 8:30 a.m. and 5:00 p.m. Voicemails left after 4:30 p.m. will not be returned until the following business day. _______________________________________________________________  For prescription refill requests, have your pharmacy contact our office. _______________________________________________________________  Recommendations made by the consultant and any test results will be sent to your referring physician. _______________________________________________________________ 

## 2020-03-01 NOTE — Progress Notes (Signed)
Candice [name] RAENGEL COLEE presents today for IV iron infusion. Infusion tolerated without incident or complaint. See MAR for details. VSS prior to and post infusion. Discharged in satisfactory condition with follow up instructions.

## 2020-03-09 DIAGNOSIS — H838X3 Other specified diseases of inner ear, bilateral: Secondary | ICD-10-CM | POA: Diagnosis not present

## 2020-03-09 DIAGNOSIS — R1312 Dysphagia, oropharyngeal phase: Secondary | ICD-10-CM | POA: Diagnosis not present

## 2020-03-09 DIAGNOSIS — D333 Benign neoplasm of cranial nerves: Secondary | ICD-10-CM | POA: Diagnosis not present

## 2020-03-09 DIAGNOSIS — D440 Neoplasm of uncertain behavior of thyroid gland: Secondary | ICD-10-CM | POA: Diagnosis not present

## 2020-03-09 DIAGNOSIS — H903 Sensorineural hearing loss, bilateral: Secondary | ICD-10-CM | POA: Diagnosis not present

## 2020-03-10 ENCOUNTER — Other Ambulatory Visit (HOSPITAL_COMMUNITY): Payer: Self-pay | Admitting: Otolaryngology

## 2020-03-10 DIAGNOSIS — E042 Nontoxic multinodular goiter: Secondary | ICD-10-CM

## 2020-03-12 DIAGNOSIS — I10 Essential (primary) hypertension: Secondary | ICD-10-CM | POA: Diagnosis not present

## 2020-03-12 DIAGNOSIS — E7849 Other hyperlipidemia: Secondary | ICD-10-CM | POA: Diagnosis not present

## 2020-03-15 IMAGING — XA IR CARO CERE HEAD/NECK UNILAT LEFT (MS)
5 series · 13 of 24 positions shown · IV contrast (IODINE)
Comparison: none

PROCEDURE:
DIAGNOSTIC CEREBRAL ANGIOGRAM
HISTORY: The patient is a 68-year-old woman who is approximately 3 months
status post pipeline embolization of a left carotid aneurysm. During
the procedure she had significant catheter-related vasospasm
necessitating intraoperative administration of vasodilators. This
led to migration of the flow diverting stent. She therefore presents
today for short-term follow-up.
TECHNIQUE: CATHETERS AND WIRES
5-French Jimmons-Q glide catheter

[Series 1: n roadmap · 2 of 20 frames shown]
[frame 4/20]
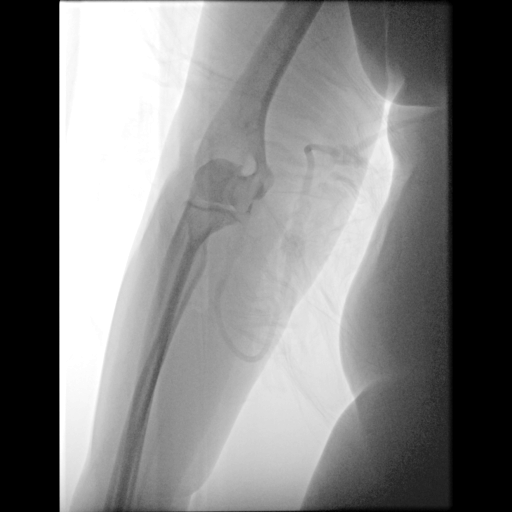
[frame 18/20]
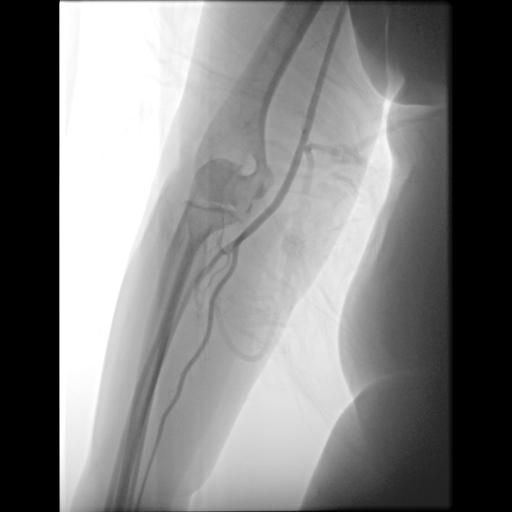

[Series 2: cerebral care 2 · 2 acquisitions, 2 frames shown (1 of 3)]
[im 1/2]
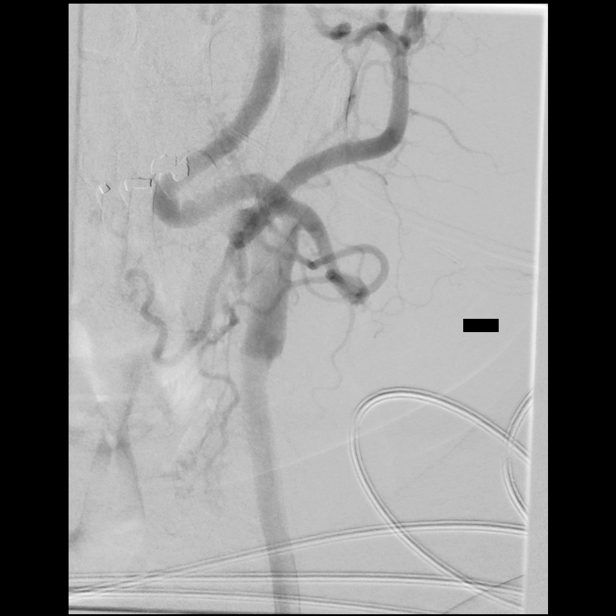
[im 2/2]
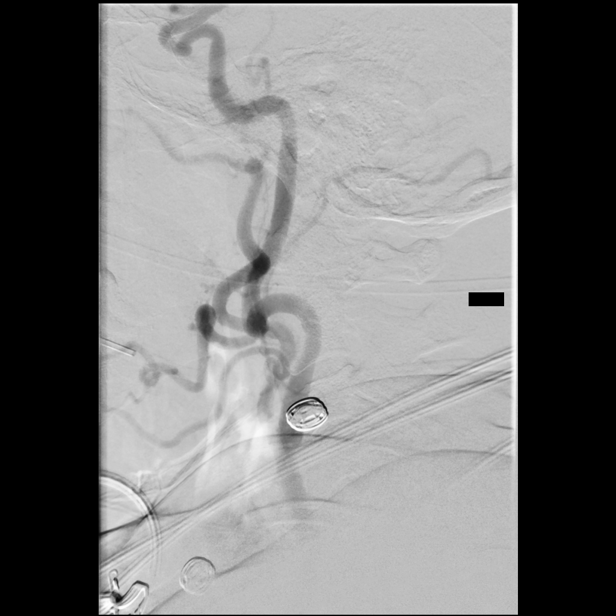

[Series 3: cerebral care 2 · 2 acquisitions, 4 frames shown (2 of 3)]
[im 1/2]
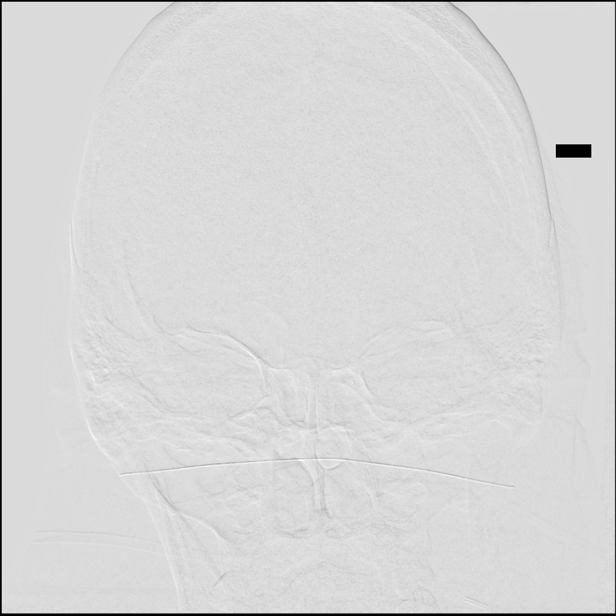
[im 1/2]
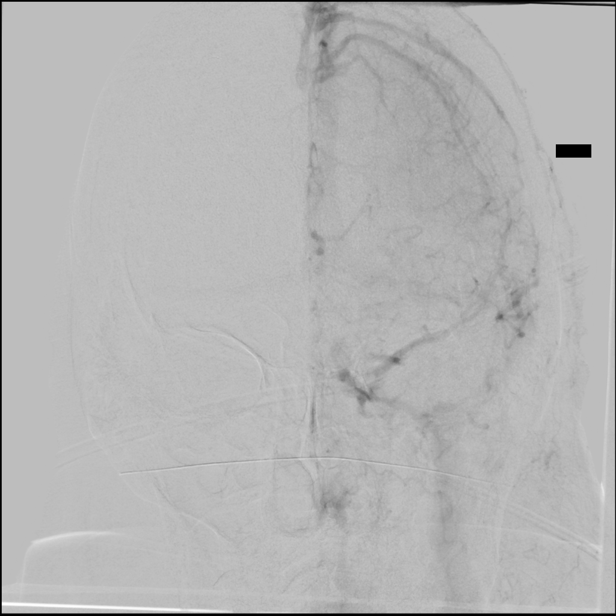
[im 2/2]
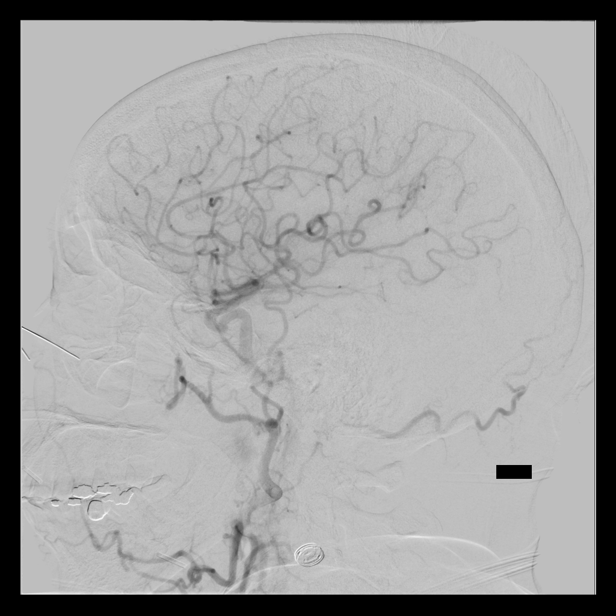
[im 2/2]
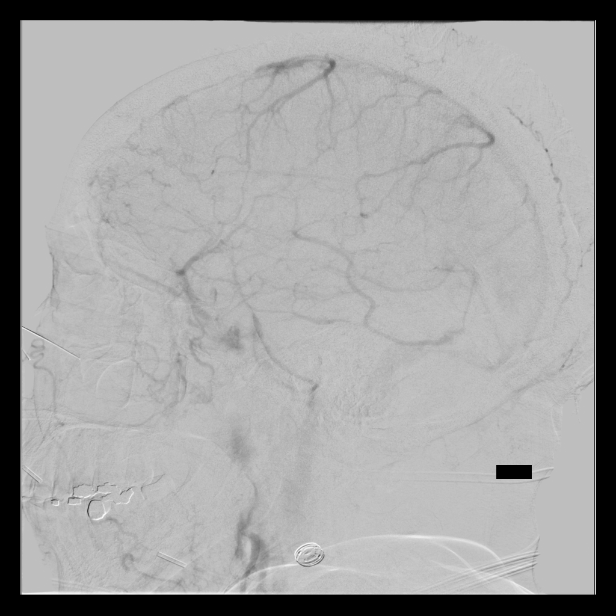

[Series 4: cerebral care 2 · 2 acquisitions, 2 frames shown (3 of 3)]
[im 1/2]
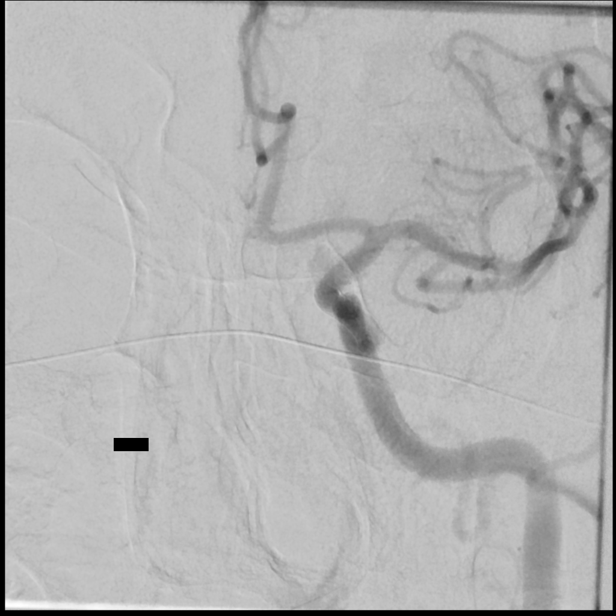
[im 2/2]
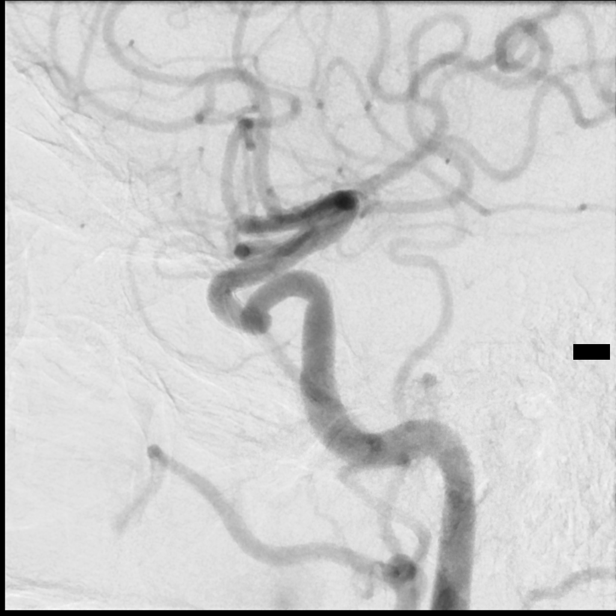

[Series 300: dr. (person_name) · 3 of 7 slices shown]
[im 1/7]
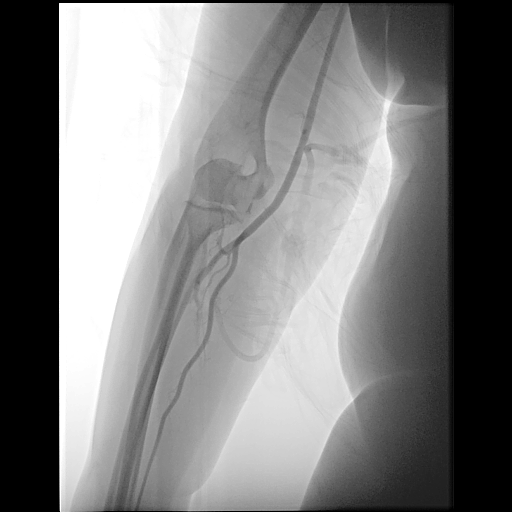
[im 4/7]
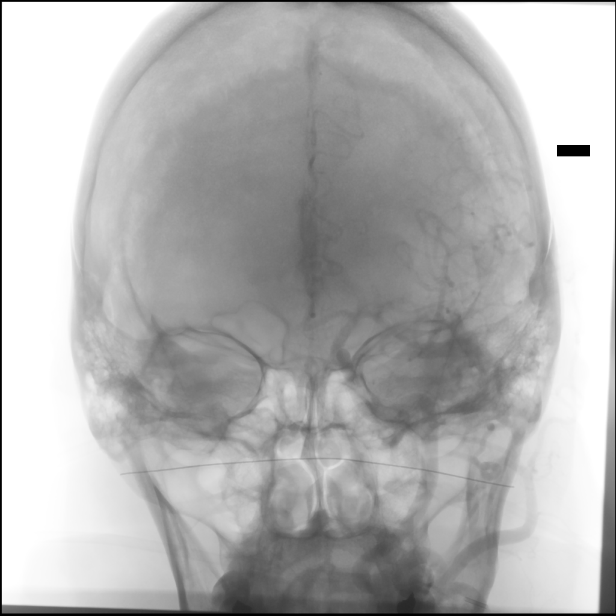
[im 7/7]
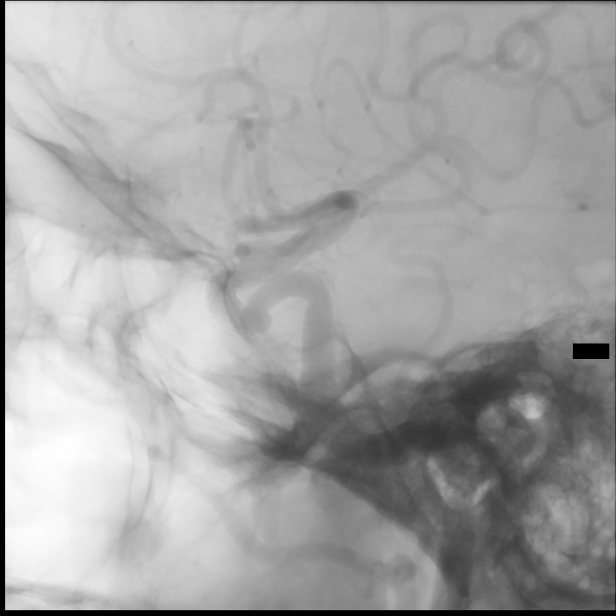

[13 of 24 positions shown; findings below may reference images not displayed]

ACCESS:
The technical aspects of the procedure as well as its potential
risks and benefits were reviewed with the patient. These risks
included but were not limited bleeding, infection, allergic
reaction, damage to organs or vital structures, stroke,
non-diagnostic procedure, and the catastrophic outcomes of heart
attack, coma, and death. With an understanding of these risks,
informed consent was obtained and witnessed. The patient was placed
in the supine position on the angiography table and the skin of
right groin prepped in the usual sterile fashion.

The procedure was performed under local anesthesia (1%-solution of
bicarbonate-buffered Lidocaine) and conscious sedation with 1mg
versed and 34micrograms fentanyl monitored by myself and the
in-suite nurse using continuous pulse-oximetry, heart rate, and
non-invasive blood-pressure.

A 5- French sheath was introduced in the right radial artery under
ultrasound guidance using Seldinger technique. A fluoro-phase
sequence was used to document the sheath position.

MEDICATIONS:
Heparin: 7777 Units total.

Verapamil: 2.5mg

Nitroglycerin: 955micrograms

CONTRAST:  30mL OMNIPAQUE IOHEXOL 300 MG/ML  SOLNcc, Omnipaque 300

FLUOROSCOPY TIME:  FLUOROSCOPY TIME: See IR records
0.035" glidewire

VESSELS CATHETERIZED
Left common carotid

Right radial

VESSELS STUDIED
Left common carotid, neck

Left common carotid, head

Right radial

PROCEDURAL NARRATIVE
A 5-Chudomir Bialkov catheter was advanced over a 0.035 glidewire into the
aortic arch. The above vessel was catheterized and cervical /
cerebral angiograms taken. After review of images, the catheter was
removed without incident.
FINDINGS: Left common carotid, neck:

The cervical portion of the left internal carotid artery is
unremarkable, without any stenosis, or flow limitation. There is no
evidence of arterial dissection. Visualized cervical branches of the
external carotid artery are unremarkable.

Left common carotid: Head:

Injection reveals the presence of a widely patent ICA, A1, and M1
segments and their branches. The pipeline stent appears to be in
stable position, and is now well opposed to the internal carotid
artery. The stent extends from the anterior genu of the cavernous
segment into the supraclinoid portion of the internal carotid
artery. There is no in stent stenosis noted. There is minimal
filling of the previously seen left ophthalmic aneurysm, with
significant contrast stasis noted, indicating continued flow
remodeling. The parenchymal and venous phases are normal. The venous
sinuses are widely patent.

Right radial:

Normal vessel. No significant atherosclerotic disease. Arterial
sheath in adequate position.

DISPOSITION:
Upon completion of the study, the radial sheath was removed and
patent hemostasis was obtained with application of the Terumo TR
band. The procedure was well tolerated and no early complications
were observed. The patient was transferred to the holding area for
further care and removal of the TR band.
IMPRESSION: 1. Stable position of pipeline device 3 months status post
embolization of a left ophthalmic aneurysm. There is no in stent
stenosis. The aneurysm demonstrates marked reduction in filling
indicating continued flow remodeling.

The preliminary results of this procedure were shared with the
patient and the patient's family.

## 2020-03-15 IMAGING — XA IR US GUIDE VASC ACCESS RIGHT
5 series · 13 of 24 positions shown · IV contrast (IODINE)
Comparison: none

PROCEDURE:
DIAGNOSTIC CEREBRAL ANGIOGRAM
HISTORY: The patient is a 68-year-old woman who is approximately 3 months
status post pipeline embolization of a left carotid aneurysm. During
the procedure she had significant catheter-related vasospasm
necessitating intraoperative administration of vasodilators. This
led to migration of the flow diverting stent. She therefore presents
today for short-term follow-up.
TECHNIQUE: CATHETERS AND WIRES
5-French Jimmons-Q glide catheter

[Series 1: n roadmap · 2 of 20 frames shown]
[frame 4/20]
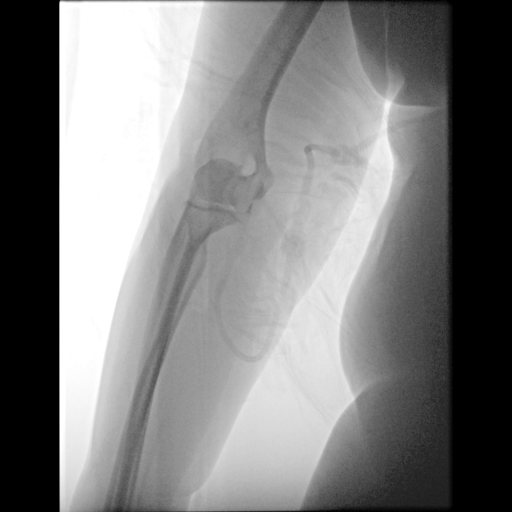
[frame 18/20]
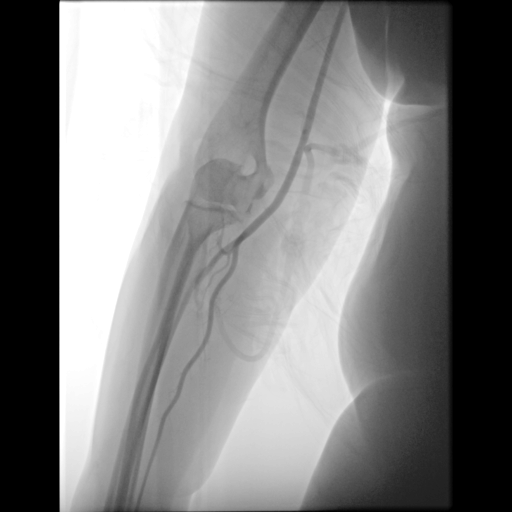

[Series 2: cerebral care 2 · 2 acquisitions, 2 frames shown (1 of 3)]
[im 1/2]
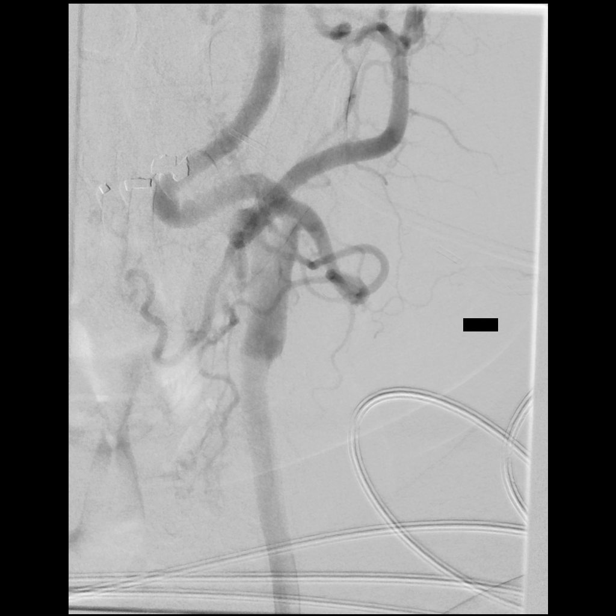
[im 2/2]
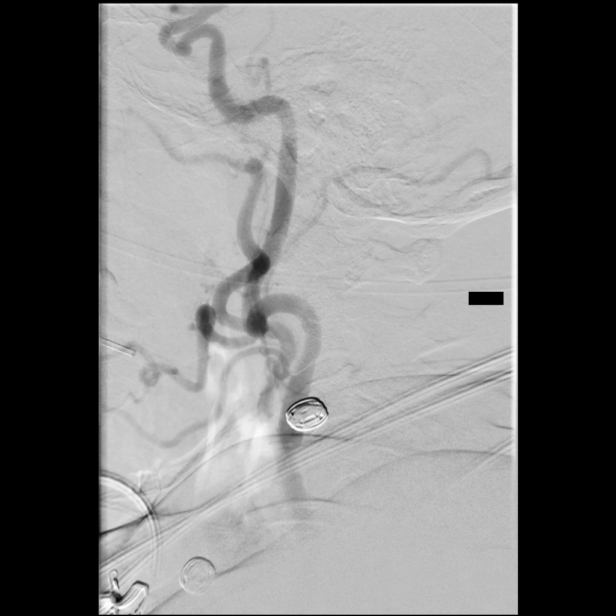

[Series 3: cerebral care 2 · 2 acquisitions, 4 frames shown (2 of 3)]
[im 1/2]
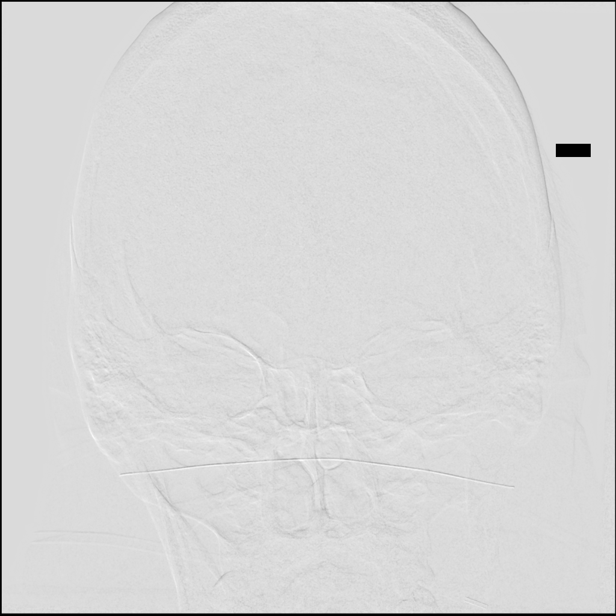
[im 1/2]
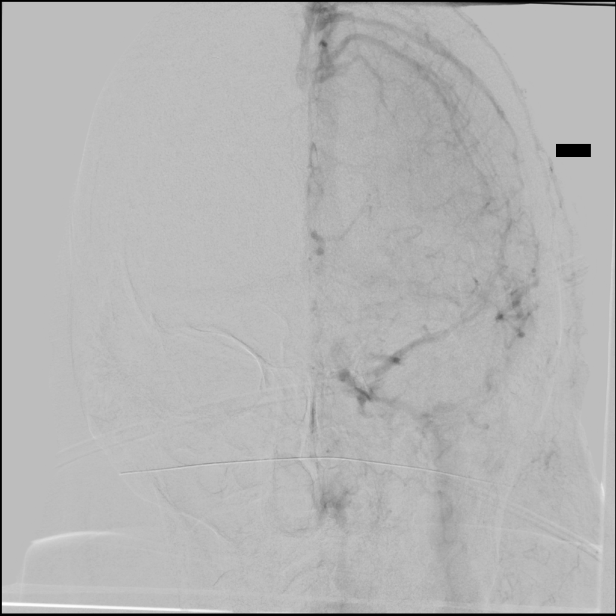
[im 2/2]
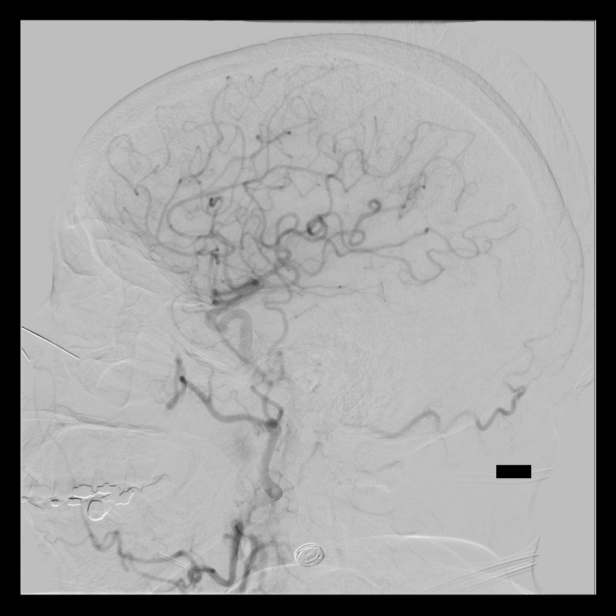
[im 2/2]
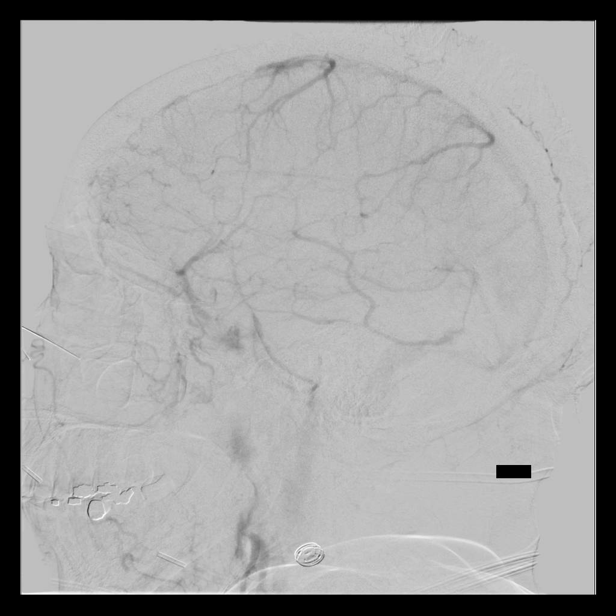

[Series 4: cerebral care 2 · 2 acquisitions, 2 frames shown (3 of 3)]
[im 1/2]
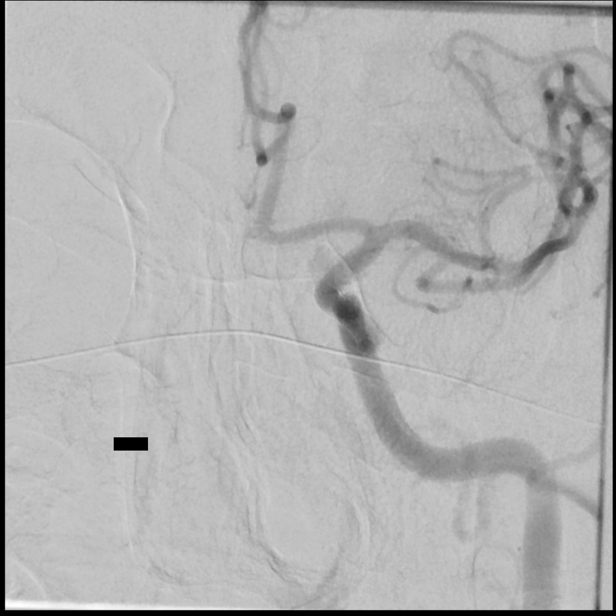
[im 2/2]
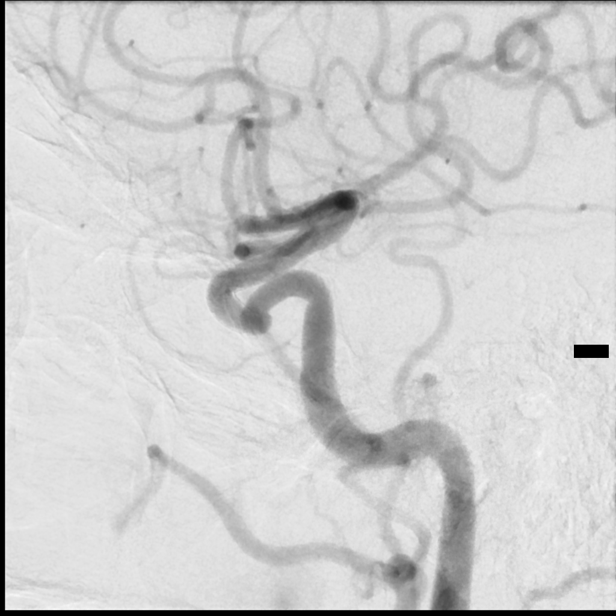

[Series 300: dr. (person_name) · 3 of 7 slices shown]
[im 1/7]
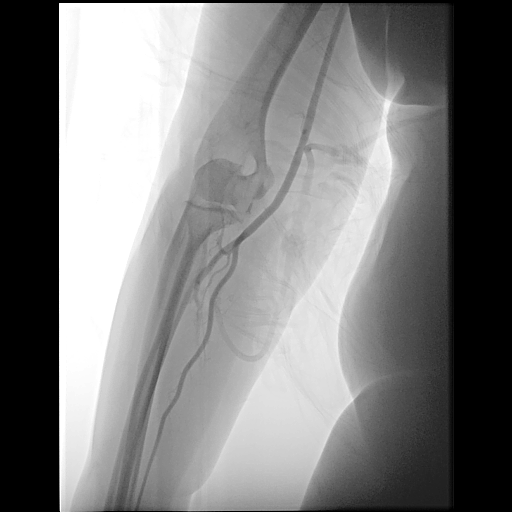
[im 4/7]
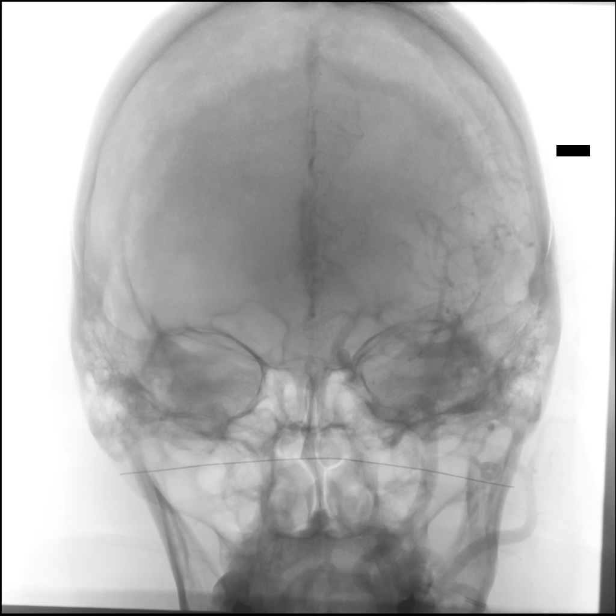
[im 7/7]
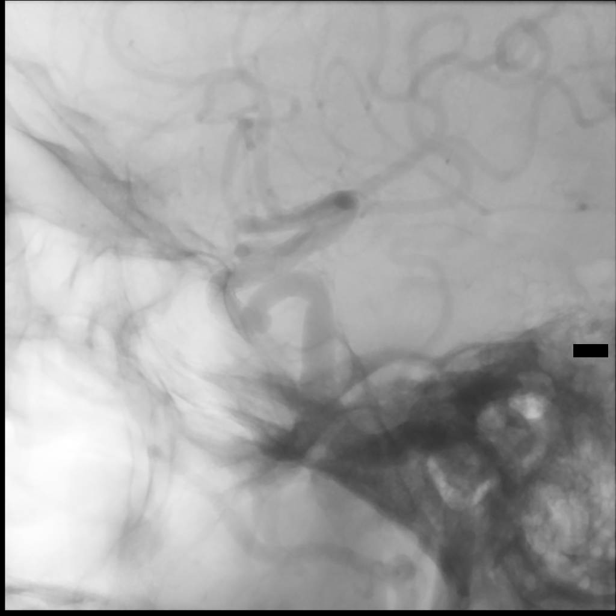

[13 of 24 positions shown; findings below may reference images not displayed]

ACCESS:
The technical aspects of the procedure as well as its potential
risks and benefits were reviewed with the patient. These risks
included but were not limited bleeding, infection, allergic
reaction, damage to organs or vital structures, stroke,
non-diagnostic procedure, and the catastrophic outcomes of heart
attack, coma, and death. With an understanding of these risks,
informed consent was obtained and witnessed. The patient was placed
in the supine position on the angiography table and the skin of
right groin prepped in the usual sterile fashion.

The procedure was performed under local anesthesia (1%-solution of
bicarbonate-buffered Lidocaine) and conscious sedation with 1mg
versed and 34micrograms fentanyl monitored by myself and the
in-suite nurse using continuous pulse-oximetry, heart rate, and
non-invasive blood-pressure.

A 5- French sheath was introduced in the right radial artery under
ultrasound guidance using Seldinger technique. A fluoro-phase
sequence was used to document the sheath position.

MEDICATIONS:
Heparin: 7777 Units total.

Verapamil: 2.5mg

Nitroglycerin: 955micrograms

CONTRAST:  30mL OMNIPAQUE IOHEXOL 300 MG/ML  SOLNcc, Omnipaque 300

FLUOROSCOPY TIME:  FLUOROSCOPY TIME: See IR records
0.035" glidewire

VESSELS CATHETERIZED
Left common carotid

Right radial

VESSELS STUDIED
Left common carotid, neck

Left common carotid, head

Right radial

PROCEDURAL NARRATIVE
A 5-Chudomir Bialkov catheter was advanced over a 0.035 glidewire into the
aortic arch. The above vessel was catheterized and cervical /
cerebral angiograms taken. After review of images, the catheter was
removed without incident.
FINDINGS: Left common carotid, neck:

The cervical portion of the left internal carotid artery is
unremarkable, without any stenosis, or flow limitation. There is no
evidence of arterial dissection. Visualized cervical branches of the
external carotid artery are unremarkable.

Left common carotid: Head:

Injection reveals the presence of a widely patent ICA, A1, and M1
segments and their branches. The pipeline stent appears to be in
stable position, and is now well opposed to the internal carotid
artery. The stent extends from the anterior genu of the cavernous
segment into the supraclinoid portion of the internal carotid
artery. There is no in stent stenosis noted. There is minimal
filling of the previously seen left ophthalmic aneurysm, with
significant contrast stasis noted, indicating continued flow
remodeling. The parenchymal and venous phases are normal. The venous
sinuses are widely patent.

Right radial:

Normal vessel. No significant atherosclerotic disease. Arterial
sheath in adequate position.

DISPOSITION:
Upon completion of the study, the radial sheath was removed and
patent hemostasis was obtained with application of the Terumo TR
band. The procedure was well tolerated and no early complications
were observed. The patient was transferred to the holding area for
further care and removal of the TR band.
IMPRESSION: 1. Stable position of pipeline device 3 months status post
embolization of a left ophthalmic aneurysm. There is no in stent
stenosis. The aneurysm demonstrates marked reduction in filling
indicating continued flow remodeling.

The preliminary results of this procedure were shared with the
patient and the patient's family.

## 2020-03-17 ENCOUNTER — Other Ambulatory Visit: Payer: Self-pay

## 2020-03-17 ENCOUNTER — Encounter: Payer: Self-pay | Admitting: Neurology

## 2020-03-17 ENCOUNTER — Ambulatory Visit (INDEPENDENT_AMBULATORY_CARE_PROVIDER_SITE_OTHER): Payer: Medicare Other | Admitting: Neurology

## 2020-03-17 VITALS — BP 134/82 | HR 75 | Temp 96.9°F | Ht 66.0 in | Wt 207.0 lb

## 2020-03-17 DIAGNOSIS — G5711 Meralgia paresthetica, right lower limb: Secondary | ICD-10-CM | POA: Diagnosis not present

## 2020-03-17 DIAGNOSIS — R200 Anesthesia of skin: Secondary | ICD-10-CM | POA: Diagnosis not present

## 2020-03-17 DIAGNOSIS — R202 Paresthesia of skin: Secondary | ICD-10-CM
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
## 2020-03-17 NOTE — Patient Instructions (Signed)
You may have a condition called peripheral neuropathy, i. e. nerve damage. There is no specific treatment for most neuropathies. The most common cause for neuropathy is diabetes in this country, in which case, tight glucose control is key. Other causes include thyroid disease, and some vitamin deficiencies. Certain medications such as chemotherapy agents and other chemicals or toxins including alcohol can cause neuropathy. There are some genetic conditions or hereditary neuropathies. Typically patients will report a family history of neuropathy in those conditions. There are cases associated with cancers and autoimmune conditions. Most neuropathies are progressive unless a root cause can be found and treated. For most neuropathies there is no actual cure or reversing of symptoms. Painful neuropathy can be difficult to treat symptomatically, but there are some medications available to ease the symptoms. Electrophysiologic testing with nerve conduction velocity studies and EMG (muscle testing) do not always pick up neuropathies that affect the smallest fibers. Other common tests include different type of blood work, and rarely, spinal fluid testing, and sometimes we resort to asking for a nerve and muscle biopsy.  At this time, we will proceed with blood work today.  We will do an EMG and nerve conduction velocity test, which is an electrical nerve and muscle test, which we will schedule. We will call you with the results.

## 2020-03-18 ENCOUNTER — Encounter (HOSPITAL_COMMUNITY): Payer: Self-pay

## 2020-03-18 ENCOUNTER — Ambulatory Visit (HOSPITAL_COMMUNITY)
Admission: RE | Admit: 2020-03-18 | Discharge: 2020-03-18 | Disposition: A | Discharge status: Home / Self Care | Payer: Medicare Other | Source: Ambulatory Visit | Attending: Otolaryngology | Admitting: Otolaryngology

## 2020-03-18 ENCOUNTER — Other Ambulatory Visit (HOSPITAL_COMMUNITY): Payer: Self-pay | Admitting: Otolaryngology

## 2020-03-18 ENCOUNTER — Other Ambulatory Visit: Payer: Self-pay

## 2020-03-18 DIAGNOSIS — E042 Nontoxic multinodular goiter: Secondary | ICD-10-CM | POA: Insufficient documentation

## 2020-03-18 DIAGNOSIS — E0789 Other specified disorders of thyroid: Secondary | ICD-10-CM | POA: Diagnosis not present

## 2020-03-18 MED ORDER — LIDOCAINE HCL (PF) 2 % IJ SOLN
INTRAMUSCULAR | Status: AC
  Administered 2020-03-18: 15 mL
  Filled 2020-03-18: qty 10

## 2020-03-18 MED ORDER — LIDOCAINE HCL (PF) 2 % IJ SOLN
INTRAMUSCULAR | Status: AC
  Filled 2020-03-18: qty 10

## 2020-03-18 NOTE — Sedation Documentation (Signed)
PT tolerated biopsy procedure well. PT verbalized understanding of discharge instructions and ambulated to waiting back to waiting area with NAD noted.

## 2020-03-19 LAB — CYTOLOGY - NON PAP

## 2020-03-22 LAB — MULTIPLE MYELOMA PANEL, SERUM
Albumin SerPl Elph-Mcnc: 4.1 g/dL (ref 2.9–4.4)
Albumin/Glob SerPl: 1.5 (ref 0.7–1.7)
Alpha 1: 0.2 g/dL (ref 0.0–0.4)
Alpha2 Glob SerPl Elph-Mcnc: 0.7 g/dL (ref 0.4–1.0)
B-Globulin SerPl Elph-Mcnc: 1.1 g/dL (ref 0.7–1.3)
Gamma Glob SerPl Elph-Mcnc: 0.8 g/dL (ref 0.4–1.8)
Globulin, Total: 2.8 g/dL (ref 2.2–3.9)
IgG (Immunoglobin G), Serum: 909 mg/dL (ref 586–1602)
IgM (Immunoglobulin M), Srm: 31 mg/dL (ref 26–217)
Immunoglobulin A, (IgA) QN, Serum: 204 mg/dL (ref 64–422)
Total Protein: 6.9 g/dL (ref 6.0–8.5)

## 2020-03-22 LAB — RHEUMATOID FACTOR: Rheumatoid fact SerPl-aCnc: <10 [IU]/mL (ref 0.0–13.9)

## 2020-03-22 LAB — SYPHILIS: RPR W/REFLEX TO RPR TITER AND TREPONEMAL ANTIBODIES, TRADITIONAL SCREENING AND DIAGNOSIS ALGORITHM: RPR Ser Ql: NONREACTIVE

## 2020-03-22 LAB — CK: Total CK: 211 U/L — ABNORMAL HIGH (ref 32–182)

## 2020-03-22 LAB — ANA W/REFLEX: Anti Nuclear Antibody (ANA): NEGATIVE

## 2020-03-22 LAB — VITAMIN B1: Thiamine: 150.8 nmol/L (ref 66.5–200.0)

## 2020-03-22 LAB — HEAVY METALS PROFILE II, BLOOD
Arsenic: 7 ug/L (ref 2–23)
Cadmium: <0.5 ug/L (ref 0.0–1.2)
Lead, Blood: <1 ug/dL (ref 0–4)
Mercury: <1 ug/L (ref 0.0–14.9)

## 2020-03-22 LAB — C-REACTIVE PROTEIN: CRP: 9 mg/L (ref 0–10)

## 2020-03-22 NOTE — Progress Notes (Signed)
Please call patient and advise her that her labs were unremarkable with the exception of a mildly increased CK level which is muscle enzymes.  This is a nonspecific finding, no other abnormality was noted, which is reassuring.  We will proceed with the electrical nerve and muscle testing called EMG and nerve conduction velocity testing.

## 2020-03-23 ENCOUNTER — Telehealth: Payer: Self-pay

## 2020-03-23 NOTE — Telephone Encounter (Signed)
I contacted the pt and advised of results. She verbalized understanding and will keep nerve conduction study as scheduled.

## 2020-03-23 NOTE — Telephone Encounter (Signed)
-----   Message from Star Age, MD sent at 03/22/2020  4:58 PM EDT ----- Please call patient and advise her that her labs were unremarkable with the exception of a mildly increased CK level which is muscle enzymes.  This is a nonspecific finding, no other abnormality was noted, which is reassuring.  We will proceed with the electrical nerve and muscle testing called EMG and nerve conduction velocity testing.

## 2020-03-24 NOTE — Progress Notes (Signed)
Full Name: Candice [name] Gender: Female MRN #: [id number] Date of Birth: [birth year]    Visit Date: 03/25/2020 09:48 Age: 69 Years Examining Physician: Sarina Ill, MD  Referring Physician: Star Age, MD Height: 5 feet 6 inch    History: Burning sensation in hands without neck pain or radicular symptoms. She also reports low back pain with radiation down the right outer thigh and lateral lower leg to the foot, left leg unaffected.   Summary: EMG/NCS was performed on the bilateral upper extremities and right lower extremity.  The right sural sensory nerve showed reduced amplitude(3uV, N>6). The right Median 2nd Digit orthodromic sensory nerve showed prolonged distal peak latency (3.7 ms, N<3.4). The right median/ulnar (palm) comparison nerve showed prolonged distal peak latency (Median Palm, 3.1 ms, N<2.2) and abnormal peak latency difference (Median Palm-Ulnar Palm, 1.1 ms, N<0.4) with a relative median delay.  The left median/ulnar (palm) comparison nerve showed  abnormal peak latency difference (Median Palm-Ulnar Palm, 0.5 ms, N<0.4) with a relative median delay.     Conclusion: 1. Patient has mild right Carpal Tunnel Syndrome. 2. 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 3.No electrodiagnostic evidence of radiculopathy found to explain patient's low back pain with radiation in the leg to the foot as well as sensory changes in the thigh. Patient's body habitus is too large to technically complete nerve conductions for meralgia paresthetica. 4.  The decrease in the right sural sensory nerve in the lower extremity is of uncertain clinical significance given no other abnormal leg findings and can be seen in normal elderly patients.      Sarina Ill, M.D.  [facility] [place], [place]  [address] Tel: [phone] Fax: [phone]  Verbal informed consent was obtained from the patient, patient was informed of potential risk of procedure, including bruising, bleeding, hematoma formation, infection, muscle weakness, muscle pain, numbness, among others.         [place]    Nerve / Sites Muscle Latency Ref. Amplitude Ref. Rel Amp Segments Distance Velocity Ref. Area    ms ms mV mV %  cm m/s m/s mVms  R Median - APB     Wrist APB 3.9 ?4.4 7.9 ?4.0 100 Wrist - APB 7   21.7     Upper arm APB 8.3  7.5  94.5 Upper arm - Wrist 23 51 ?49 20.9  R Ulnar - ADM     Wrist ADM 2.5 ?3.3 11.8 ?6.0 100 Wrist - ADM 7   27.2     B.Elbow ADM 5.9  10.1  85.8 B.Elbow - Wrist 20 59 ?49 25.7     A.Elbow ADM 7.7  9.7  95.9 A.Elbow - B.Elbow 10 55 ?49 25.5         A.Elbow - Wrist      R Peroneal - EDB     Ankle EDB 2.9 ?6.5 6.2 ?2.0 100 Ankle - EDB 9   16.8     Fib head EDB 9.9  4.8  78.3 Fib head - Ankle 31 44 ?44 15.3     Pop fossa EDB 12.2  4.8  99.4 Pop fossa - Fib head 10 44 ?44 13.9         Pop fossa - Ankle      R Tibial - AH     Ankle AH 3.7 ?5.8 5.3 ?  4.0 100 Ankle - AH 9   9.7     Pop fossa AH 13.5  2.6  48.3 Pop fossa - Ankle 40 41 ?41 6.8             SNC    Nerve / Sites Rec. Site Peak Lat Ref.  Amp Ref. Segments Distance Peak Diff Ref.    ms ms V V  cm ms ms  R Sural - Ankle (Calf)     Calf Ankle 3.3 ?4.4 13 ?6 Calf - Ankle 14    R Superficial peroneal - Ankle     Lat leg Ankle 4.1 ?4.4 3 ?6 Lat leg - Ankle 14    L Median, Ulnar - Transcarpal comparison     Median Palm Wrist 2.1 ?2.2 45 ?35 Median Palm - Wrist 8       Ulnar Palm Wrist 1.6 ?2.2 16 ?12 Ulnar Palm - Wrist 8          Median Palm - Ulnar Palm  0.5 ?0.4  R Median, Ulnar - Transcarpal comparison     Median Palm Wrist 2.5 ?2.2 40 ?35 Median Palm - Wrist 8       Ulnar Palm Wrist 1.9 ?2.2 13 ?12 Ulnar Palm - Wrist 8          Median Palm - Ulnar Palm  0.6 ?0.4  R Median - Orthodromic (Dig II, Mid palm)     Dig II Wrist  3.7 ?3.4 10 ?10 Dig II - Wrist 13    L Median - Orthodromic (Dig II, Mid palm)     Dig II Wrist 3.1 ?3.4 10 ?10 Dig II - Wrist 13    R Ulnar - Orthodromic, (Dig V, Mid palm)     Dig V Wrist 2.8 ?3.1 8 ?5 Dig V - Wrist 59                     F  Wave    Nerve F Lat Ref.   ms ms  R Tibial - AH 49.8 ?56.0  R Ulnar - ADM 29.7 ?32.0         EMG Summary Table    Spontaneous MUAP Recruitment  Muscle IA Fib PSW Fasc Other Amp Dur. Poly Pattern  R. Cervical paraspinals (low) Normal None None None _______ Normal Normal Normal Normal  R. Deltoid Normal None None None _______ Normal Normal Normal Normal  R. Triceps brachii Normal None None None _______ Normal Normal Normal Normal  R. Pronator teres Normal None None None _______ Normal Normal Normal Normal  R. First dorsal interosseous Normal None None None _______ Normal Normal Normal Normal  R. Opponens pollicis Normal None None None _______ Normal Normal Normal Normal  R. Vastus medialis Normal None None None _______ Normal Normal Normal Normal  R. Tibialis anterior Normal None None None _______ Normal Normal Normal Normal  R. Gastrocnemius (Medial head) Normal None None None _______ Normal Normal Normal Normal  R. Abductor hallucis Normal None None None _______ Normal Normal Normal Normal  R. Extensor hallucis longus Normal None None None _______ Normal Normal Normal Normal

## 2020-03-25 ENCOUNTER — Other Ambulatory Visit: Payer: Self-pay

## 2020-03-25 ENCOUNTER — Ambulatory Visit (INDEPENDENT_AMBULATORY_CARE_PROVIDER_SITE_OTHER): Payer: Medicare Other | Admitting: Neurology

## 2020-03-25 DIAGNOSIS — R202 Paresthesia of skin: Secondary | ICD-10-CM

## 2020-03-25 DIAGNOSIS — R200 Anesthesia of skin: Secondary | ICD-10-CM | POA: Diagnosis not present

## 2020-03-25 DIAGNOSIS — G5711 Meralgia paresthetica, right lower limb: Secondary | ICD-10-CM

## 2020-03-25 DIAGNOSIS — G5603 Carpal tunnel syndrome, bilateral upper limbs: Secondary | ICD-10-CM

## 2020-03-25 DIAGNOSIS — Z0289 Encounter for other administrative examinations: Secondary | ICD-10-CM

## 2020-03-25 NOTE — Progress Notes (Signed)
See procedure note.

## 2020-03-29 ENCOUNTER — Telehealth: Payer: Self-pay | Admitting: Neurology

## 2020-03-29 DIAGNOSIS — G5603 Carpal tunnel syndrome, bilateral upper limbs: Secondary | ICD-10-CM | POA: Insufficient documentation

## 2020-03-29 NOTE — Procedures (Signed)
Full Name: Candice [name] Gender: Female MRN #: [id number] Date of Birth: 01-12-1951    Visit Date: 03/25/2020 09:48 Age: 69 Years Examining Physician: Sarina Ill, MD  Referring Physician: Star Age, MD Height: 5 feet 6 inch    History: Burning sensation in hands without neck pain or radicular symptoms. She also reports low back pain with radiation down the right outer thigh and lateral lower leg to the foot, left leg unaffected.   Summary: EMG/NCS was performed on the bilateral upper extremities and right lower extremity.  The right sural sensory nerve showed reduced amplitude(3uV, N>6). The right Median 2nd Digit orthodromic sensory nerve showed prolonged distal peak latency (3.7 ms, N<3.4). The right median/ulnar (palm) comparison nerve showed prolonged distal peak latency (Median Palm, 3.1 ms, N<2.2) and abnormal peak latency difference (Median Palm-Ulnar Palm, 1.1 ms, N<0.4) with a relative median delay.  The left median/ulnar (palm) comparison nerve showed  abnormal peak latency difference (Median Palm-Ulnar Palm, 0.5 ms, N<0.4) with a relative median delay.     Conclusion: 1. Patient has mild right Carpal Tunnel Syndrome. 2. 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 3.No electrodiagnostic evidence of radiculopathy found to explain patient's low back pain with radiation in the leg to the foot as well as sensory changes in the thigh. Patient's body habitus is too large to technically complete nerve conductions for meralgia paresthetica. 4.  The decrease amplitude of the right sural sensory nerve in the lower extremity is of uncertain clinical significance given no other abnormal leg findings and can be seen in normal elderly patients.      Sarina Ill, M.D.  [facility] [place], [place] [address] Tel: [phone] Fax: [phone]  Verbal informed consent was obtained from the patient, patient was informed of potential risk of procedure, including bruising, bleeding, hematoma formation, infection, muscle weakness, muscle pain, numbness, among others.         [place]    Nerve / Sites Muscle Latency Ref. Amplitude Ref. Rel Amp Segments Distance Velocity Ref. Area    ms ms mV mV %  cm m/s m/s mVms  R Median - APB     Wrist APB 3.9 ?4.4 7.9 ?4.0 100 Wrist - APB 7   21.7     Upper arm APB 8.3  7.5  94.5 Upper arm - Wrist 23 51 ?49 20.9  R Ulnar - ADM     Wrist ADM 2.5 ?3.3 11.8 ?6.0 100 Wrist - ADM 7   27.2     B.Elbow ADM 5.9  10.1  85.8 B.Elbow - Wrist 20 59 ?49 25.7     A.Elbow ADM 7.7  9.7  95.9 A.Elbow - B.Elbow 10 55 ?49 25.5         A.Elbow - Wrist      R Peroneal - EDB     Ankle EDB 2.9 ?6.5 6.2 ?2.0 100 Ankle - EDB 9   16.8     Fib head EDB 9.9  4.8  78.3 Fib head - Ankle 31 44 ?44 15.3     Pop fossa EDB 12.2  4.8  99.4 Pop fossa - Fib head 10 44 ?44 13.9         Pop fossa - Ankle      R Tibial - AH     Ankle AH 3.7 ?5.8  5.3 ?4.0 100 Ankle - AH 9   9.7     Pop fossa AH 13.5  2.6  48.3 Pop fossa - Ankle 40 41 ?41 6.8             SNC    Nerve / Sites Rec. Site Peak Lat Ref.  Amp Ref. Segments Distance Peak Diff Ref.    ms ms V V  cm ms ms  R Sural - Ankle (Calf)     Calf Ankle 3.3 ?4.4 13 ?6 Calf - Ankle 14    R Superficial peroneal - Ankle     Lat leg Ankle 4.1 ?4.4 3 ?6 Lat leg - Ankle 14    L Median, Ulnar - Transcarpal comparison     Median Palm Wrist 2.1 ?2.2 45 ?35 Median Palm - Wrist 8       Ulnar Palm Wrist 1.6 ?2.2 16 ?12 Ulnar Palm - Wrist 8          Median Palm - Ulnar Palm  0.5 ?0.4  R Median, Ulnar - Transcarpal comparison     Median Palm Wrist 2.5 ?2.2 40 ?35 Median Palm - Wrist 8       Ulnar Palm Wrist 1.9 ?2.2 13 ?12 Ulnar Palm - Wrist 8          Median Palm - Ulnar Palm  0.6 ?0.4  R Median - Orthodromic (Dig II, Mid  palm)     Dig II Wrist 3.7 ?3.4 10 ?10 Dig II - Wrist 13    L Median - Orthodromic (Dig II, Mid palm)     Dig II Wrist 3.1 ?3.4 10 ?10 Dig II - Wrist 13    R Ulnar - Orthodromic, (Dig V, Mid palm)     Dig V Wrist 2.8 ?3.1 8 ?5 Dig V - Wrist 23                     F  Wave    Nerve F Lat Ref.   ms ms  R Tibial - AH 49.8 ?56.0  R Ulnar - ADM 29.7 ?32.0         EMG Summary Table    Spontaneous MUAP Recruitment  Muscle IA Fib PSW Fasc Other Amp Dur. Poly Pattern  R. Cervical paraspinals (low) Normal None None None _______ Normal Normal Normal Normal  R. Deltoid Normal None None None _______ Normal Normal Normal Normal  R. Triceps brachii Normal None None None _______ Normal Normal Normal Normal  R. Pronator teres Normal None None None _______ Normal Normal Normal Normal  R. First dorsal interosseous Normal None None None _______ Normal Normal Normal Normal  R. Opponens pollicis Normal None None None _______ Normal Normal Normal Normal  R. Vastus medialis Normal None None None _______ Normal Normal Normal Normal  R. Tibialis anterior Normal None None None _______ Normal Normal Normal Normal  R. Gastrocnemius (Medial head) Normal None None None _______ Normal Normal Normal Normal  R. Abductor hallucis Normal None None None _______ Normal Normal Normal Normal  R. Extensor hallucis longus Normal None None None _______ Normal Normal Normal Normal

## 2020-03-29 NOTE — Telephone Encounter (Signed)
Pt called to check on the status of her NCV/EMG results.

## 2020-03-29 NOTE — Telephone Encounter (Signed)
Pt needs results for EMG.

## 2020-03-29 NOTE — Progress Notes (Signed)
Please call patient regarding her EMG and nerve conduction velocity test from last week.  She has evidence of mild right-sided carpal tunnel syndrome.  It may help to use a wrist splint, she can get one over-the-counter, it may help at night.  She may have some early findings of carpal tunnel on the left but it was not very significant.  She has no evidence of pinched nerve from the back, no obvious telltale signs of nerve damage or what we call neuropathy, overall fairly benign test altogether.

## 2020-03-30 NOTE — Telephone Encounter (Signed)
I called pt about the EMG results. I stated the EMG and nerve conduction velocity test from last week. She has evidence of mild right-sided carpal tunnel syndrome. It may help to use a wrist splint, and to get one over-the-counter, it may help at night. There are some early findings of carpal tunnel on the left but it was not very significant. There is no evidence of pinched nerve from the back, no obvious telltale signs of nerve damage or what we call neuropathy, overall fairly benign test altogether.PT verbalized understanding. I advise pt to wear wrist on left hand at night being there are early findings of carpel tunnel. PT verbalized understanding.

## 2020-04-07 ENCOUNTER — Encounter (HOSPITAL_COMMUNITY): Payer: Self-pay

## 2020-04-12 ENCOUNTER — Encounter (HOSPITAL_COMMUNITY): Payer: Self-pay

## 2020-04-12 DIAGNOSIS — I10 Essential (primary) hypertension: Secondary | ICD-10-CM | POA: Diagnosis not present

## 2020-04-12 DIAGNOSIS — E7849 Other hyperlipidemia: Secondary | ICD-10-CM | POA: Diagnosis not present

## 2020-04-12 DIAGNOSIS — D508 Other iron deficiency anemias: Secondary | ICD-10-CM | POA: Diagnosis not present

## 2020-04-15 ENCOUNTER — Encounter (HOSPITAL_COMMUNITY): Payer: Self-pay | Admitting: Radiology

## 2020-04-19 ENCOUNTER — Other Ambulatory Visit: Payer: Self-pay

## 2020-04-19 ENCOUNTER — Inpatient Hospital Stay (HOSPITAL_COMMUNITY): Payer: Medicare Other | Attending: Hematology

## 2020-04-19 DIAGNOSIS — E079 Disorder of thyroid, unspecified: Secondary | ICD-10-CM | POA: Insufficient documentation

## 2020-04-19 DIAGNOSIS — D508 Other iron deficiency anemias: Secondary | ICD-10-CM

## 2020-04-19 DIAGNOSIS — Z79899 Other long term (current) drug therapy: Secondary | ICD-10-CM | POA: Insufficient documentation

## 2020-04-19 DIAGNOSIS — D649 Anemia, unspecified: Secondary | ICD-10-CM | POA: Diagnosis not present

## 2020-04-19 LAB — CBC WITH DIFFERENTIAL/PLATELET
Abs Immature Granulocytes: 0.03 10*3/uL (ref 0.00–0.07)
Basophils Absolute: 0 10*3/uL (ref 0.0–0.1)
Basophils Relative: 0 %
Eosinophils Absolute: 0.2 10*3/uL (ref 0.0–0.5)
Eosinophils Relative: 3 %
HCT: 31.6 % — ABNORMAL LOW (ref 36.0–46.0)
Hemoglobin: 10 g/dL — ABNORMAL LOW (ref 12.0–15.0)
Immature Granulocytes: 0 %
Lymphocytes Relative: 13 %
Lymphs Abs: 1 10*3/uL (ref 0.7–4.0)
MCH: 31.4 pg (ref 26.0–34.0)
MCHC: 31.6 g/dL (ref 30.0–36.0)
MCV: 99.4 fL (ref 80.0–100.0)
Monocytes Absolute: 0.4 10*3/uL (ref 0.1–1.0)
Monocytes Relative: 4 %
Neutro Abs: 6.3 10*3/uL (ref 1.7–7.7)
Neutrophils Relative %: 80 %
Platelets: 297 10*3/uL (ref 150–400)
RBC: 3.18 MIL/uL — ABNORMAL LOW (ref 3.87–5.11)
RDW: 17.1 % — ABNORMAL HIGH (ref 11.5–15.5)
WBC: 7.9 10*3/uL (ref 4.0–10.5)
nRBC: 0 % (ref 0.0–0.2)

## 2020-04-19 LAB — IRON AND TIBC
Iron: 60 ug/dL (ref 28–170)
Saturation Ratios: 19 % (ref 10.4–31.8)
TIBC: 314 ug/dL (ref 250–450)
UIBC: 254 ug/dL

## 2020-04-19 LAB — FERRITIN: Ferritin: 32 ng/mL (ref 11–307)

## 2020-04-21 DIAGNOSIS — M7631 Iliotibial band syndrome, right leg: Secondary | ICD-10-CM | POA: Diagnosis not present

## 2020-04-21 DIAGNOSIS — D440 Neoplasm of uncertain behavior of thyroid gland: Secondary | ICD-10-CM | POA: Diagnosis not present

## 2020-04-21 DIAGNOSIS — Z6834 Body mass index (BMI) 34.0-34.9, adult: Secondary | ICD-10-CM | POA: Diagnosis not present

## 2020-04-21 DIAGNOSIS — R1312 Dysphagia, oropharyngeal phase: Secondary | ICD-10-CM | POA: Diagnosis not present

## 2020-04-22 ENCOUNTER — Inpatient Hospital Stay (HOSPITAL_BASED_OUTPATIENT_CLINIC_OR_DEPARTMENT_OTHER): Payer: Medicare Other | Admitting: Hematology

## 2020-04-22 ENCOUNTER — Other Ambulatory Visit: Payer: Self-pay

## 2020-04-22 ENCOUNTER — Encounter (HOSPITAL_COMMUNITY): Payer: Self-pay

## 2020-04-22 DIAGNOSIS — D649 Anemia, unspecified: Secondary | ICD-10-CM

## 2020-04-22 NOTE — Progress Notes (Signed)
Virtual Visit via Telephone Note  I connected with Candice [name] on 04/22/20 at  4:00 PM EDT by telephone and verified that I am speaking with the correct person using two identifiers.   I discussed the limitations, risks, security and privacy concerns of performing an evaluation and management service by telephone and the availability of in person appointments. I also discussed with the patient that there may be a patient responsible charge related to this service. The patient expressed understanding and agreed to proceed.   History of Present Illness: She is followed for her normocytic anemia.  She receives intermittent Feraheme infusions.  Last Feraheme was on 03/01/2020.   Observations/Objective: She reports occasional blood in the stool.  Her energy levels have dropped in the last few days.  Reports appetite 75%.  Energy levels are 50%.  Occasional dizziness when standing.  No pains reported.  Assessment and Plan:  1.  Normocytic anemia: -Combination anemia from relative iron deficiency and CKD and blood loss. -We reviewed her labs.  Hemoglobin is 10.  Ferritin is 32 and percent saturation is 19. -I have recommended 2 more infusions of Feraheme. -We will check her back in 2 months for follow-up.  2.  Abnormal thyroid biopsy: -Left thyroid biopsy showed Bethesda category 3 follicular lesion of undetermined significance. -She is being planned for left hemithyroidectomy by Dr. Benjamine Mola.   Follow Up Instructions: RTC 2 months with labs.   I discussed the assessment and treatment plan with the patient. The patient was provided an opportunity to ask questions and all were answered. The patient agreed with the plan and demonstrated an understanding of the instructions.   The patient was advised to call back or seek an in-person evaluation if the symptoms worsen or if the condition fails to improve as anticipated.  I provided 12 minutes of non-face-to-face time during this  encounter.   Derek Jack, MD

## 2020-04-23 ENCOUNTER — Other Ambulatory Visit: Payer: Self-pay | Admitting: Otolaryngology

## 2020-04-29 ENCOUNTER — Inpatient Hospital Stay (HOSPITAL_COMMUNITY): Payer: Medicare Other

## 2020-04-29 ENCOUNTER — Encounter (HOSPITAL_COMMUNITY): Payer: Self-pay

## 2020-04-29 ENCOUNTER — Other Ambulatory Visit: Payer: Self-pay

## 2020-04-29 VITALS — BP 149/73 | HR 69 | Temp 97.4°F | Resp 18

## 2020-04-29 DIAGNOSIS — D508 Other iron deficiency anemias: Secondary | ICD-10-CM

## 2020-04-29 DIAGNOSIS — D649 Anemia, unspecified: Secondary | ICD-10-CM

## 2020-04-29 MED ORDER — SODIUM CHLORIDE 0.9 % IV SOLN: Freq: Once | INTRAVENOUS | Status: AC

## 2020-04-29 MED ORDER — SODIUM CHLORIDE 0.9 % IV SOLN
510.0000 mg | Freq: Once | INTRAVENOUS | Status: AC
  Administered 2020-04-29: 510 mg via INTRAVENOUS
  Filled 2020-04-29: qty 510

## 2020-04-29 NOTE — Patient Instructions (Signed)
[facility] Discharge Instructions  Received Feraheme infusion today. Follow-up as scheduled   Thank you for choosing [facility] at [facility] to provide your oncology and hematology care.  To afford each patient quality time with our provider, please arrive at least 15 minutes before your scheduled appointment time.   If you have a lab appointment with the Cancer Center please come in thru the Main Entrance and check in at the main information desk.  You need to re-schedule your appointment should you arrive 10 or more minutes late.  We strive to give you quality time with our providers, and arriving late affects you and other patients whose appointments are after yours.  Also, if you no show three or more times for appointments you may be dismissed from the clinic at the providers discretion.     Again, thank you for choosing [facility].  Our hope is that these requests will decrease the amount of time that you wait before being seen by our physicians.       _____________________________________________________________  Should you have questions after your visit to [facility], please contact our office at [phone] between the hours of 8:00 a.m. and 4:30 p.m.  Voicemails left after 4:00 p.m. will not be returned until the following business day.  For prescription refill requests, have your pharmacy contact our office and allow 72 hours.    Due to Covid, you will need to wear a mask upon entering the hospital. If you do not have a mask, a mask will be given to you at the Main Entrance upon arrival. For doctor visits, patients may have 1 support person with them. For treatment visits, patients can not have anyone with them due to social distancing guidelines and our immunocompromised population.     

## 2020-04-29 NOTE — Progress Notes (Signed)
Candice [name] tolerated Feraheme infusion well without complaints or incident. Peripheral IV site checked with positive blood return noted prior to and after infusion. VSS upon discharge. Pt discharged self ambulatory in satisfactory condition

## 2020-05-04 ENCOUNTER — Encounter (HOSPITAL_BASED_OUTPATIENT_CLINIC_OR_DEPARTMENT_OTHER): Payer: Self-pay | Admitting: Otolaryngology

## 2020-05-04 ENCOUNTER — Other Ambulatory Visit: Payer: Self-pay

## 2020-05-06 ENCOUNTER — Ambulatory Visit (HOSPITAL_COMMUNITY): Payer: Medicare Other

## 2020-05-06 ENCOUNTER — Encounter (HOSPITAL_BASED_OUTPATIENT_CLINIC_OR_DEPARTMENT_OTHER)
Admission: RE | Admit: 2020-05-06 | Discharge: 2020-05-06 | Disposition: A | Discharge status: Home / Self Care | Payer: Medicare Other | Source: Ambulatory Visit | Attending: Otolaryngology | Admitting: Otolaryngology

## 2020-05-06 DIAGNOSIS — Z87891 Personal history of nicotine dependence: Secondary | ICD-10-CM | POA: Diagnosis not present

## 2020-05-06 DIAGNOSIS — K5730 Diverticulosis of large intestine without perforation or abscess without bleeding: Secondary | ICD-10-CM | POA: Diagnosis not present

## 2020-05-06 DIAGNOSIS — F419 Anxiety disorder, unspecified: Secondary | ICD-10-CM | POA: Diagnosis not present

## 2020-05-06 DIAGNOSIS — Z79899 Other long term (current) drug therapy: Secondary | ICD-10-CM | POA: Diagnosis not present

## 2020-05-06 DIAGNOSIS — Z9889 Other specified postprocedural states: Secondary | ICD-10-CM | POA: Diagnosis present

## 2020-05-06 DIAGNOSIS — Z6832 Body mass index (BMI) 32.0-32.9, adult: Secondary | ICD-10-CM | POA: Diagnosis not present

## 2020-05-06 DIAGNOSIS — F329 Major depressive disorder, single episode, unspecified: Secondary | ICD-10-CM | POA: Diagnosis not present

## 2020-05-06 DIAGNOSIS — J45909 Unspecified asthma, uncomplicated: Secondary | ICD-10-CM | POA: Diagnosis not present

## 2020-05-06 DIAGNOSIS — Z7951 Long term (current) use of inhaled steroids: Secondary | ICD-10-CM | POA: Diagnosis not present

## 2020-05-06 DIAGNOSIS — E042 Nontoxic multinodular goiter: Secondary | ICD-10-CM | POA: Diagnosis not present

## 2020-05-06 DIAGNOSIS — I10 Essential (primary) hypertension: Secondary | ICD-10-CM | POA: Diagnosis not present

## 2020-05-06 DIAGNOSIS — D509 Iron deficiency anemia, unspecified: Secondary | ICD-10-CM | POA: Diagnosis not present

## 2020-05-06 DIAGNOSIS — Z7982 Long term (current) use of aspirin: Secondary | ICD-10-CM | POA: Diagnosis not present

## 2020-05-06 DIAGNOSIS — E669 Obesity, unspecified: Secondary | ICD-10-CM | POA: Diagnosis not present

## 2020-05-06 DIAGNOSIS — K219 Gastro-esophageal reflux disease without esophagitis: Secondary | ICD-10-CM | POA: Diagnosis not present

## 2020-05-06 LAB — BASIC METABOLIC PANEL
Anion gap: 10 (ref 5–15)
BUN: 15 mg/dL (ref 8–23)
CO2: 27 mmol/L (ref 22–32)
Calcium: 9.9 mg/dL (ref 8.9–10.3)
Chloride: 102 mmol/L (ref 98–111)
Creatinine, Ser: 1.19 mg/dL — ABNORMAL HIGH (ref 0.44–1.00)
GFR calc Af Amer: 53 mL/min — ABNORMAL LOW (ref >60)
GFR calc non Af Amer: 46 mL/min — ABNORMAL LOW (ref >60)
Glucose, Bld: 97 mg/dL (ref 70–99)
Potassium: 4.3 mmol/L (ref 3.5–5.1)
Sodium: 139 mmol/L (ref 135–145)

## 2020-05-06 LAB — CBC
HCT: 35.4 % — ABNORMAL LOW (ref 36.0–46.0)
Hemoglobin: 11.3 g/dL — ABNORMAL LOW (ref 12.0–15.0)
MCH: 31.2 pg (ref 26.0–34.0)
MCHC: 31.9 g/dL (ref 30.0–36.0)
MCV: 97.8 fL (ref 80.0–100.0)
Platelets: 266 10*3/uL (ref 150–400)
RBC: 3.62 MIL/uL — ABNORMAL LOW (ref 3.87–5.11)
RDW: 17.3 % — ABNORMAL HIGH (ref 11.5–15.5)
WBC: 5.9 10*3/uL (ref 4.0–10.5)
nRBC: 0 % (ref 0.0–0.2)

## 2020-05-07 ENCOUNTER — Encounter (HOSPITAL_COMMUNITY): Payer: Self-pay

## 2020-05-07 ENCOUNTER — Other Ambulatory Visit (HOSPITAL_COMMUNITY)
Admission: RE | Admit: 2020-05-07 | Discharge: 2020-05-07 | Disposition: A | Discharge status: Home / Self Care | Payer: Medicare Other | Source: Ambulatory Visit | Attending: Otolaryngology | Admitting: Otolaryngology

## 2020-05-07 ENCOUNTER — Inpatient Hospital Stay (HOSPITAL_COMMUNITY): Payer: Medicare Other

## 2020-05-07 VITALS — BP 123/61 | HR 64 | Temp 97.5°F | Resp 18

## 2020-05-07 DIAGNOSIS — D649 Anemia, unspecified: Secondary | ICD-10-CM | POA: Diagnosis not present

## 2020-05-07 DIAGNOSIS — D508 Other iron deficiency anemias: Secondary | ICD-10-CM

## 2020-05-07 DIAGNOSIS — Z20822 Contact with and (suspected) exposure to covid-19: Secondary | ICD-10-CM | POA: Insufficient documentation

## 2020-05-07 DIAGNOSIS — Z01812 Encounter for preprocedural laboratory examination: Secondary | ICD-10-CM | POA: Insufficient documentation

## 2020-05-07 LAB — SARS CORONAVIRUS 2 (TAT 6-24 HRS): SARS Coronavirus 2: NEGATIVE

## 2020-05-07 MED ORDER — SODIUM CHLORIDE 0.9 % IV SOLN: Freq: Once | INTRAVENOUS | Status: AC

## 2020-05-07 MED ORDER — SODIUM CHLORIDE 0.9 % IV SOLN
510.0000 mg | Freq: Once | INTRAVENOUS | Status: AC
  Administered 2020-05-07: 510 mg via INTRAVENOUS
  Filled 2020-05-07: qty 510

## 2020-05-07 NOTE — Progress Notes (Signed)
Patient presents today for Feraheme infusion. Patient has no complaints of any changes since the last visit. Vital signs are stable. Patient has no complaints of pain. MAR reviewed.   Feraheme given today per MD orders. Tolerated infusion without adverse affects. Vital signs stable. No complaints at this time. Discharged from clinic ambulatory. F/U with [facility] as scheduled.

## 2020-05-07 NOTE — Patient Instructions (Signed)
[facility]  Discharge Instructions:   _______________________________________________________________  Thank you for choosing [facility] at [facility] to provide your oncology and hematology care.  To afford each patient quality time with our providers, please arrive at least 15 minutes before your scheduled appointment.  You need to re-schedule your appointment if you arrive 10 or more minutes late.  We strive to give you quality time with our providers, and arriving late affects you and other patients whose appointments are after yours.  Also, if you no show three or more times for appointments you may be dismissed from the clinic.  Again, thank you for choosing [facility]. Our hope is that these requests will allow you access to exceptional care and in a timely manner. _______________________________________________________________  If you have questions after your visit, please contact our office at [phone] between the hours of 8:30 a.m. and 5:00 p.m. Voicemails left after 4:30 p.m. will not be returned until the following business day. _______________________________________________________________  For prescription refill requests, have your pharmacy contact our office. _______________________________________________________________  Recommendations made by the consultant and any test results will be sent to your referring physician. _______________________________________________________________ 

## 2020-05-11 ENCOUNTER — Ambulatory Visit (HOSPITAL_BASED_OUTPATIENT_CLINIC_OR_DEPARTMENT_OTHER): Payer: Medicare Other | Admitting: Anesthesiology

## 2020-05-11 ENCOUNTER — Other Ambulatory Visit: Payer: Self-pay

## 2020-05-11 ENCOUNTER — Ambulatory Visit (HOSPITAL_BASED_OUTPATIENT_CLINIC_OR_DEPARTMENT_OTHER)
Admission: RE | Admit: 2020-05-11 | Discharge: 2020-05-12 | Disposition: A | Discharge status: Home / Self Care | Payer: Medicare Other | Attending: Otolaryngology | Admitting: Otolaryngology

## 2020-05-11 ENCOUNTER — Encounter (HOSPITAL_BASED_OUTPATIENT_CLINIC_OR_DEPARTMENT_OTHER): Payer: Self-pay | Admitting: Otolaryngology

## 2020-05-11 ENCOUNTER — Encounter (HOSPITAL_BASED_OUTPATIENT_CLINIC_OR_DEPARTMENT_OTHER)
Admission: RE | Disposition: A | Discharge status: Home / Self Care | Payer: Self-pay | Source: Home / Self Care | Attending: Otolaryngology

## 2020-05-11 DIAGNOSIS — J45909 Unspecified asthma, uncomplicated: Secondary | ICD-10-CM | POA: Insufficient documentation

## 2020-05-11 DIAGNOSIS — F419 Anxiety disorder, unspecified: Secondary | ICD-10-CM | POA: Insufficient documentation

## 2020-05-11 DIAGNOSIS — K5730 Diverticulosis of large intestine without perforation or abscess without bleeding: Secondary | ICD-10-CM | POA: Insufficient documentation

## 2020-05-11 DIAGNOSIS — Z87891 Personal history of nicotine dependence: Secondary | ICD-10-CM | POA: Insufficient documentation

## 2020-05-11 DIAGNOSIS — Z7982 Long term (current) use of aspirin: Secondary | ICD-10-CM | POA: Insufficient documentation

## 2020-05-11 DIAGNOSIS — I10 Essential (primary) hypertension: Secondary | ICD-10-CM | POA: Insufficient documentation

## 2020-05-11 DIAGNOSIS — D509 Iron deficiency anemia, unspecified: Secondary | ICD-10-CM | POA: Insufficient documentation

## 2020-05-11 DIAGNOSIS — E042 Nontoxic multinodular goiter: Secondary | ICD-10-CM | POA: Diagnosis not present

## 2020-05-11 DIAGNOSIS — Z6832 Body mass index (BMI) 32.0-32.9, adult: Secondary | ICD-10-CM | POA: Insufficient documentation

## 2020-05-11 DIAGNOSIS — F329 Major depressive disorder, single episode, unspecified: Secondary | ICD-10-CM | POA: Insufficient documentation

## 2020-05-11 DIAGNOSIS — Z79899 Other long term (current) drug therapy: Secondary | ICD-10-CM | POA: Insufficient documentation

## 2020-05-11 DIAGNOSIS — E049 Nontoxic goiter, unspecified: Secondary | ICD-10-CM | POA: Diagnosis not present

## 2020-05-11 DIAGNOSIS — E669 Obesity, unspecified: Secondary | ICD-10-CM | POA: Insufficient documentation

## 2020-05-11 DIAGNOSIS — Z7951 Long term (current) use of inhaled steroids: Secondary | ICD-10-CM | POA: Diagnosis not present

## 2020-05-11 DIAGNOSIS — E890 Postprocedural hypothyroidism: Secondary | ICD-10-CM

## 2020-05-11 DIAGNOSIS — K219 Gastro-esophageal reflux disease without esophagitis: Secondary | ICD-10-CM | POA: Insufficient documentation

## 2020-05-11 HISTORY — PX: THYROIDECTOMY: SHX17

## 2020-05-11 SURGERY — THYROIDECTOMY
Anesthesia: General | Site: Neck | Laterality: Left

## 2020-05-11 MED ORDER — LIDOCAINE-EPINEPHRINE 1 %-1:100000 IJ SOLN
INTRAMUSCULAR | Status: DC | PRN
  Administered 2020-05-11: 4 mL

## 2020-05-11 MED ORDER — LORATADINE 10 MG PO TABS
10.0000 mg | ORAL_TABLET | Freq: Every day | ORAL | Status: DC
  Administered 2020-05-11: 10 mg via ORAL
  Filled 2020-05-11 (×2): qty 1

## 2020-05-11 MED ORDER — IRBESARTAN 150 MG PO TABS
150.0000 mg | ORAL_TABLET | Freq: Every day | ORAL | Status: DC
  Filled 2020-05-11: qty 1

## 2020-05-11 MED ORDER — OXYCODONE-ACETAMINOPHEN 5-325 MG PO TABS
1.0000 | ORAL_TABLET | ORAL | Status: DC | PRN
  Administered 2020-05-11 – 2020-05-12 (×5): 1 via ORAL
  Filled 2020-05-11 (×5): qty 1

## 2020-05-11 MED ORDER — CEFAZOLIN SODIUM-DEXTROSE 2-4 GM/100ML-% IV SOLN
INTRAVENOUS | Status: AC
  Filled 2020-05-11: qty 100

## 2020-05-11 MED ORDER — HYDROMORPHONE HCL 1 MG/ML IJ SOLN
0.2500 mg | INTRAMUSCULAR | Status: DC | PRN
  Administered 2020-05-11 (×2): 0.25 mg via INTRAVENOUS

## 2020-05-11 MED ORDER — HYDROCHLOROTHIAZIDE 12.5 MG PO CAPS
12.5000 mg | ORAL_CAPSULE | Freq: Every day | ORAL | Status: DC
  Filled 2020-05-11: qty 1

## 2020-05-11 MED ORDER — PROPOFOL 500 MG/50ML IV EMUL
INTRAVENOUS | Status: DC | PRN
Start: 2020-05-11 — End: 2020-05-11
  Administered 2020-05-11: 25 ug/kg/min via INTRAVENOUS

## 2020-05-11 MED ORDER — PROPOFOL 500 MG/50ML IV EMUL
INTRAVENOUS | Status: AC
  Filled 2020-05-11: qty 50

## 2020-05-11 MED ORDER — CLONAZEPAM 0.5 MG PO TABS: 0.2500 mg | ORAL_TABLET | Freq: Every day | ORAL | Status: DC

## 2020-05-11 MED ORDER — PANTOPRAZOLE SODIUM 40 MG PO TBEC
40.0000 mg | DELAYED_RELEASE_TABLET | Freq: Every day | ORAL | Status: DC
  Administered 2020-05-11: 40 mg via ORAL
  Filled 2020-05-11: qty 1

## 2020-05-11 MED ORDER — OXYCODONE HCL 5 MG PO TABS: 5.0000 mg | ORAL_TABLET | Freq: Once | ORAL | Status: DC | PRN

## 2020-05-11 MED ORDER — DEXAMETHASONE SODIUM PHOSPHATE 10 MG/ML IJ SOLN
INTRAMUSCULAR | Status: AC
  Filled 2020-05-11: qty 1

## 2020-05-11 MED ORDER — FENTANYL CITRATE (PF) 100 MCG/2ML IJ SOLN
INTRAMUSCULAR | Status: AC
  Filled 2020-05-11: qty 2

## 2020-05-11 MED ORDER — DEXAMETHASONE SODIUM PHOSPHATE 4 MG/ML IJ SOLN
INTRAMUSCULAR | Status: DC | PRN
  Administered 2020-05-11: 5 mg via INTRAVENOUS

## 2020-05-11 MED ORDER — SUCCINYLCHOLINE CHLORIDE 200 MG/10ML IV SOSY
PREFILLED_SYRINGE | INTRAVENOUS | Status: AC
  Filled 2020-05-11: qty 10

## 2020-05-11 MED ORDER — PROMETHAZINE HCL 25 MG/ML IJ SOLN: 6.2500 mg | INTRAMUSCULAR | Status: DC | PRN

## 2020-05-11 MED ORDER — PHENYLEPHRINE HCL (PRESSORS) 10 MG/ML IV SOLN
INTRAVENOUS | Status: DC | PRN
Start: 2020-05-11 — End: 2020-05-11
  Administered 2020-05-11 (×2): 80 ug via INTRAVENOUS

## 2020-05-11 MED ORDER — FENTANYL CITRATE (PF) 100 MCG/2ML IJ SOLN
INTRAMUSCULAR | Status: DC | PRN
  Administered 2020-05-11 (×3): 50 ug via INTRAVENOUS

## 2020-05-11 MED ORDER — ALBUTEROL SULFATE HFA 108 (90 BASE) MCG/ACT IN AERS: 2.0000 | INHALATION_SPRAY | RESPIRATORY_TRACT | Status: DC | PRN

## 2020-05-11 MED ORDER — HYDROMORPHONE HCL 1 MG/ML IJ SOLN
INTRAMUSCULAR | Status: AC
  Filled 2020-05-11: qty 0.5

## 2020-05-11 MED ORDER — PROPOFOL 10 MG/ML IV BOLUS
INTRAVENOUS | Status: AC
  Filled 2020-05-11: qty 20

## 2020-05-11 MED ORDER — EPHEDRINE SULFATE 50 MG/ML IJ SOLN
INTRAMUSCULAR | Status: DC | PRN
Start: 2020-05-11 — End: 2020-05-11
  Administered 2020-05-11 (×2): 10 mg via INTRAVENOUS

## 2020-05-11 MED ORDER — PROPOFOL 10 MG/ML IV BOLUS
INTRAVENOUS | Status: DC | PRN
  Administered 2020-05-11: 30 mg via INTRAVENOUS
  Administered 2020-05-11: 50 mg via INTRAVENOUS
  Administered 2020-05-11: 150 mg via INTRAVENOUS

## 2020-05-11 MED ORDER — SIMVASTATIN 20 MG PO TABS
20.0000 mg | ORAL_TABLET | Freq: Every day | ORAL | Status: DC
  Administered 2020-05-11: 20 mg via ORAL
  Filled 2020-05-11: qty 1

## 2020-05-11 MED ORDER — GABAPENTIN 100 MG PO CAPS
100.0000 mg | ORAL_CAPSULE | Freq: Every day | ORAL | Status: DC
  Administered 2020-05-11: 100 mg via ORAL

## 2020-05-11 MED ORDER — OLMESARTAN MEDOXOMIL-HCTZ 20-12.5 MG PO TABS: 1.0000 | ORAL_TABLET | Freq: Every day | ORAL | Status: DC

## 2020-05-11 MED ORDER — AMOXICILLIN 875 MG PO TABS: 875.0000 mg | ORAL_TABLET | Freq: Two times a day (BID) | ORAL | 0 refills | Status: AC

## 2020-05-11 MED ORDER — GABAPENTIN 100 MG PO CAPS
ORAL_CAPSULE | ORAL | Status: AC
  Filled 2020-05-11: qty 1

## 2020-05-11 MED ORDER — PHENYLEPHRINE HCL-NACL 10-0.9 MG/250ML-% IV SOLN
INTRAVENOUS | Status: DC | PRN
  Administered 2020-05-11: 50 ug/min via INTRAVENOUS

## 2020-05-11 MED ORDER — MOMETASONE FURO-FORMOTEROL FUM 200-5 MCG/ACT IN AERO
2.0000 | INHALATION_SPRAY | Freq: Two times a day (BID) | RESPIRATORY_TRACT | Status: DC
  Administered 2020-05-11: 2 via RESPIRATORY_TRACT
  Filled 2020-05-11: qty 8.8

## 2020-05-11 MED ORDER — AMISULPRIDE (ANTIEMETIC) 5 MG/2ML IV SOLN: 10.0000 mg | Freq: Once | INTRAVENOUS | Status: DC | PRN

## 2020-05-11 MED ORDER — CEFAZOLIN SODIUM-DEXTROSE 2-3 GM-%(50ML) IV SOLR
INTRAVENOUS | Status: DC | PRN
  Administered 2020-05-11: 2 g via INTRAVENOUS

## 2020-05-11 MED ORDER — LIDOCAINE 2% (20 MG/ML) 5 ML SYRINGE
INTRAMUSCULAR | Status: AC
  Filled 2020-05-11: qty 5

## 2020-05-11 MED ORDER — KCL IN DEXTROSE-NACL 20-5-0.45 MEQ/L-%-% IV SOLN
INTRAVENOUS | Status: DC
  Filled 2020-05-11: qty 1000

## 2020-05-11 MED ORDER — SUCCINYLCHOLINE CHLORIDE 20 MG/ML IJ SOLN
INTRAMUSCULAR | Status: DC | PRN
  Administered 2020-05-11: 120 mg via INTRAVENOUS

## 2020-05-11 MED ORDER — MORPHINE SULFATE (PF) 4 MG/ML IV SOLN: 2.0000 mg | INTRAVENOUS | Status: DC | PRN

## 2020-05-11 MED ORDER — ACYCLOVIR 400 MG PO TABS
400.0000 mg | ORAL_TABLET | Freq: Every day | ORAL | Status: DC
  Administered 2020-05-11: 400 mg via ORAL
  Filled 2020-05-11 (×2): qty 1

## 2020-05-11 MED ORDER — OXYCODONE-ACETAMINOPHEN 5-325 MG PO TABS: 1.0000 | ORAL_TABLET | ORAL | 0 refills | Status: AC | PRN

## 2020-05-11 MED ORDER — LACTATED RINGERS IV SOLN: INTRAVENOUS | Status: DC

## 2020-05-11 MED ORDER — OXYCODONE HCL 5 MG/5ML PO SOLN: 5.0000 mg | Freq: Once | ORAL | Status: DC | PRN

## 2020-05-11 MED ORDER — ONDANSETRON HCL 4 MG/2ML IJ SOLN
INTRAMUSCULAR | Status: AC
  Filled 2020-05-11: qty 2

## 2020-05-11 SURGICAL SUPPLY — 62 items
ATTRACTOMAT 16X20 MAGNETIC DRP (DRAPES) IMPLANT
BLADE CLIPPER SURG (BLADE) IMPLANT
BLADE SURG 10 STRL SS (BLADE) IMPLANT
BLADE SURG 15 STRL LF DISP TIS (BLADE) ×1 IMPLANT
BLADE SURG 15 STRL SS (BLADE) ×3
CANISTER SUCT 1200ML W/VALVE (MISCELLANEOUS) ×3 IMPLANT
CLIP VESOCCLUDE SM WIDE 6/CT (CLIP) IMPLANT
CORD BIPOLAR FORCEPS 12FT (ELECTRODE) ×3 IMPLANT
COVER BACK TABLE 60X90IN (DRAPES) ×3 IMPLANT
COVER MAYO STAND STRL (DRAPES) ×3 IMPLANT
COVER WAND RF STERILE (DRAPES) IMPLANT
DECANTER SPIKE VIAL GLASS SM (MISCELLANEOUS) IMPLANT
DERMABOND ADVANCED (GAUZE/BANDAGES/DRESSINGS) ×2
DERMABOND ADVANCED .7 DNX12 (GAUZE/BANDAGES/DRESSINGS) ×1 IMPLANT
DRAIN CHANNEL 10F 3/8 F FF (DRAIN) ×3 IMPLANT
DRAPE U-SHAPE 76X120 STRL (DRAPES) ×3 IMPLANT
ELECT COATED BLADE 2.86 ST (ELECTRODE) ×3 IMPLANT
ELECT REM PT RETURN 9FT ADLT (ELECTROSURGICAL) ×3
ELECTRODE REM PT RTRN 9FT ADLT (ELECTROSURGICAL) ×1 IMPLANT
EVACUATOR SILICONE 100CC (DRAIN) ×3 IMPLANT
FORCEPS BIPOLAR SPETZLER 8 1.0 (NEUROSURGERY SUPPLIES) ×3 IMPLANT
GAUZE 4X4 16PLY RFD (DISPOSABLE) ×9 IMPLANT
GAUZE SPONGE 4X4 12PLY STRL LF (GAUZE/BANDAGES/DRESSINGS) IMPLANT
GLOVE BIO SURGEON STRL SZ 6.5 (GLOVE) IMPLANT
GLOVE BIO SURGEON STRL SZ7.5 (GLOVE) ×3 IMPLANT
GLOVE BIO SURGEONS STRL SZ 6.5 (GLOVE)
GLOVE BIOGEL M 6.5 STRL (GLOVE) ×3 IMPLANT
GLOVE BIOGEL PI IND STRL 7.0 (GLOVE) ×3 IMPLANT
GLOVE BIOGEL PI INDICATOR 7.0 (GLOVE) ×6
GLOVE SURG SS PI 7.0 STRL IVOR (GLOVE) ×3 IMPLANT
GOWN STRL REUS W/ TWL LRG LVL3 (GOWN DISPOSABLE) ×3 IMPLANT
GOWN STRL REUS W/TWL LRG LVL3 (GOWN DISPOSABLE) ×9
HEMOSTAT SURGICEL 2X14 (HEMOSTASIS) IMPLANT
NEEDLE HYPO 25X1 1.5 SAFETY (NEEDLE) ×3 IMPLANT
NS IRRIG 1000ML POUR BTL (IV SOLUTION) ×3 IMPLANT
PENCIL SMOKE EVACUATOR (MISCELLANEOUS) ×3 IMPLANT
PIN SAFETY STERILE (MISCELLANEOUS) ×3 IMPLANT
PROBE NERVBE PRASS .33 (MISCELLANEOUS) ×3 IMPLANT
SET BASIN DAY SURGERY F.S. (CUSTOM PROCEDURE TRAY) ×3 IMPLANT
SHEARS HARMONIC 9CM CVD (BLADE) ×3 IMPLANT
SLEEVE SCD COMPRESS KNEE MED (MISCELLANEOUS) ×3 IMPLANT
SPONGE INTESTINAL PEANUT (DISPOSABLE) ×6 IMPLANT
STAPLER VISISTAT 35W (STAPLE) IMPLANT
SUT ETHILON 3 0 PS 1 (SUTURE) ×3 IMPLANT
SUT PROLENE 5 0 P 3 (SUTURE) IMPLANT
SUT SILK 2 0 SH (SUTURE) ×3 IMPLANT
SUT SILK 2 0 TIES 17X18 (SUTURE)
SUT SILK 2-0 18XBRD TIE BLK (SUTURE) IMPLANT
SUT SILK 3 0 TIES 17X18 (SUTURE) ×6
SUT SILK 3-0 18XBRD TIE BLK (SUTURE) ×2 IMPLANT
SUT VIC AB 3-0 FS2 27 (SUTURE) ×3 IMPLANT
SUT VICRYL 4-0 PS2 18IN ABS (SUTURE) ×3 IMPLANT
SYR BULB EAR ULCER 3OZ GRN STR (SYRINGE) ×3 IMPLANT
SYR CONTROL 10ML LL (SYRINGE) ×3 IMPLANT
TOWEL GREEN STERILE FF (TOWEL DISPOSABLE) ×6 IMPLANT
TRAY DSU PREP LF (CUSTOM PROCEDURE TRAY) ×3 IMPLANT
TUBE CONNECTING 20'X1/4 (TUBING) ×1
TUBE CONNECTING 20X1/4 (TUBING) ×2 IMPLANT
TUBE ENDOTRAC NIMS EMG 6MM (MISCELLANEOUS) IMPLANT
TUBE ENDOTRAC NIMS EMG 7MM (MISCELLANEOUS) IMPLANT
TUBE ENDOTRAC NIMS EMG 8MM (MISCELLANEOUS) ×3 IMPLANT
TUBE ENDOTRAC NIMS EMG 9MM (MISCELLANEOUS) IMPLANT

## 2020-05-11 NOTE — Anesthesia Preprocedure Evaluation (Signed)
Anesthesia Evaluation  Patient identified by MRN, date of birth, ID band Patient awake    Reviewed: Allergy & Precautions, NPO status , Patient's Chart, lab work & pertinent test results  Airway Mallampati: II  TM Distance: >3 FB     Dental  (+) Dental Advisory Given   Pulmonary asthma , former smoker,    breath sounds clear to auscultation       Cardiovascular hypertension, Pt. on medications  Rhythm:Regular Rate:Normal     Neuro/Psych Anxiety Depression Cerebral aneurysm    GI/Hepatic Neg liver ROS, GERD  ,  Endo/Other  negative endocrine ROS  Renal/GU negative Renal ROS     Musculoskeletal   Abdominal (+) + obese,   Peds  Hematology negative hematology ROS (+)   Anesthesia Other Findings   Reproductive/Obstetrics                             Lab Results  Component Value Date   WBC 5.9 05/06/2020   HGB 11.3 (L) 05/06/2020   HCT 35.4 (L) 05/06/2020   MCV 97.8 05/06/2020   PLT 266 05/06/2020   Lab Results  Component Value Date   CREATININE 1.19 (H) 05/06/2020   BUN 15 05/06/2020   NA 139 05/06/2020   K 4.3 05/06/2020   CL 102 05/06/2020   CO2 27 05/06/2020    Anesthesia Physical  Anesthesia Plan  ASA: III  Anesthesia Plan: General   Post-op Pain Management:    Induction: Intravenous  PONV Risk Score and Plan: 3 and Dexamethasone, Ondansetron, Treatment may vary due to age or medical condition and Midazolam  Airway Management Planned: Oral ETT  Additional Equipment:   Intra-op Plan:   Post-operative Plan: Extubation in OR  Informed Consent: I have reviewed the patients History and Physical, chart, labs and discussed the procedure including the risks, benefits and alternatives for the proposed anesthesia with the patient or authorized representative who has indicated his/her understanding and acceptance.     Dental advisory given  Plan Discussed with:  CRNA  Anesthesia Plan Comments:         Anesthesia Quick Evaluation

## 2020-05-11 NOTE — Op Note (Signed)
DATE OF PROCEDURE:  05/11/2020                              OPERATIVE REPORT  SURGEON:  Leta Baptist, MD  PREOPERATIVE DIAGNOSES: 1. Suspicious left thyroid mass.  POSTOPERATIVE DIAGNOSES: 1. Suspicious left thyroid mass.  PROCEDURE PERFORMED: Left total thyroid lobectomy.  ANESTHESIA:  General endotracheal tube anesthesia.  COMPLICATIONS:  None.  ESTIMATED BLOOD LOSS: 100 ml  INDICATION FOR PROCEDURE:  Candice [name] is a 69 y.o. female with a history of a multinodular thyroid goiter.  Her previous ultrasound showed the largest nodule on the right at 2.6 cm in diameter.  The largest nodule on the left measured 5.4 cm in diameter.  She subsequently underwent a fine needle aspiration biopsy of both nodules.  The right nodule biopsy was benign.  However, the left nodule was suspicious.  The specimen was sent for [facility] testing.  The molecular genetic testing found the chance of malignancy to be 50%. Based on the above findings, the decision was made for the patient to undergo the adenotonsillectomy procedure. Likelihood of success in reducing symptoms was also discussed.  The risks, benefits, alternatives, and details of the procedure were discussed with the patient.  Questions were invited and answered.  Informed consent was obtained.  DESCRIPTION:  The patient was taken to the operating room and placed supine on the operating table.  General endotracheal tube anesthesia was administered by the anesthesiologist.  A nerve monitoring endotracheal tube was used.  The nerve monitoring system was functional throughout the case.  The patient was positioned and prepped and draped in the standard fashion for thyroid surgery.  1% lidocaine with 1 100,000 epinephrine was infiltrated at the planned site of incision.  A transverse lower neck incision was made.  The incision was carried down to the level of the platysma muscles.  Superiorly based and inferiorly based subplatysmal flaps were elevated in the  standard fashion.  The strap muscles were divided at midline, and retracted laterally, exposing the thyroid gland.  Careful dissection was performed to free the left thyroid lobe from the surrounding soft tissue.  The patient was noted to have a large 5 cm left thyroid nodule.  The left recurrent laryngeal nerve was identified and preserved.  The nerve was noted to be functional throughout the case.  An inferior left parathyroid gland was also identified and preserved.  The entire left thyroid lobe was resected and sent to the pathology department for permanent histologic identification.  A #10 JP drain was placed.  The strap muscles were reapproximated with 4-0 Vicryl sutures.  The incision was closed in layers with 4-0 Vicryl and Dermabond.  The care of the patient was turned over to the anesthesiologist.  The patient was awakened from anesthesia without difficulty.  The patient was extubated and transferred to the recovery room in good condition.  OPERATIVE FINDINGS:  A large 5 cm left thyroid mass.  SPECIMEN: Left thyroid lobe.  FOLLOWUP CARE:  The patient will be admitted for overnight observation.  Darian Cansler W Lorrie Gargan 05/11/2020 11:27 AM

## 2020-05-11 NOTE — Anesthesia Procedure Notes (Signed)
Procedure Name: Intubation Date/Time: 05/11/2020 9:30 AM Performed by: Maryella Shivers, CRNA Pre-anesthesia Checklist: Patient identified, Emergency Drugs available, Suction available and Patient being monitored Patient Re-evaluated:Patient Re-evaluated prior to induction Oxygen Delivery Method: Circle system utilized Preoxygenation: Pre-oxygenation with 100% oxygen Induction Type: IV induction Ventilation: Mask ventilation with difficulty Grade View: Grade I Tube type: Oral Tube size: 8.0 mm Number of attempts: 1 Airway Equipment and Method: Stylet,  Oral airway and Video-laryngoscopy (GlidescopePlacement of NIMMS ETT) Placement Confirmation: ETT inserted through vocal cords under direct vision,  positive ETCO2 and breath sounds checked- equal and bilateral Tube secured with: Tape Dental Injury: Teeth and Oropharynx as per pre-operative assessment

## 2020-05-11 NOTE — H&P (Signed)
Cc: Multinodular thyroid goiter  HPI: The patient is a 69 year old female who returns today for her follow-up evaluation. The patient was previously seen for multiple medical issues.  Among them, the patient was noted to have a multinodular thyroid goiter.  Her previous ultrasound showed the largest nodule on the right at 2.6 cm in diameter.  The largest nodule on the left measured 5.4 cm in diameter.  She subsequently underwent a fine needle aspiration biopsy of both nodules.  The right nodule biopsy was benign.  However, the left nodule was suspicious.  The specimen was sent for [facility] testing.  The molecular genetic testing found the chance of malignancy to be 50%.  The patient returns today reporting no new symptoms.  She has a history of intermittent compressive symptoms.  She has occasional dysphagia.  She denies any dyspnea or odynophagia.  No other ENT, GI, or respiratory issue noted since the last visit.   Exam: General: Communicates without difficulty, well nourished, no acute distress. Head: Normocephalic, no evidence injury, no tenderness, facial buttresses intact without stepoff. Face/sinus: No tenderness to palpation and percussion. Facial movement is normal and symmetric. Eyes: PERRL, EOMI. No scleral icterus, conjunctivae clear. Neuro: CN II exam reveals vision grossly intact.  No nystagmus at any point of gaze. Ears: Auricles well formed without lesions.  Ear canals are intact without mass or lesion.  No erythema or edema is appreciated.  The TMs are intact without fluid. Nose: External evaluation reveals normal support and skin without lesions.  Dorsum is intact.  Anterior rhinoscopy reveals pink mucosa over anterior aspect of inferior turbinates and intact septum.  No purulence noted. Oral:  Oral cavity and oropharynx are intact, symmetric, without erythema or edema.  Mucosa is moist without lesions. Neck: Full range of motion without pain.  There is no significant lymphadenopathy.  No  masses palpable.  Thyroid bed full to palpation.  Parotid glands and submandibular glands equal bilaterally without mass.  Trachea is midline. Neuro:  CN 2-12 grossly intact. Gait normal. Vestibular: No nystagmus at any point of gaze. The cerebellar examination is unremarkable.   Procedure:  Flexible Fiberoptic Laryngoscopy Risks, benefits, and alternatives of flexible endoscopy were explained to the patient.  Specific mention was made of the risk of throat numbness with difficulty swallowing, possible bleeding from the nose and mouth, and pain from the procedure.  The patient gave oral consent to proceed.  The nasal cavities were decongested and anesthetised with a combination of oxymetazoline and 4% lidocaine solution.  The flexible scope was inserted into the right nasal cavity and advanced towards the nasopharynx.  Visualized mucosa over the turbinates and septum were as described above.  The nasopharynx was clear.  Oropharyngeal walls were symmetric and mobile without lesion, mass, or edema.  Hypopharynx was also without  lesion or edema.  Larynx was mobile without lesions. Supraglottic structures were free of edema, mass, and asymmetry.  True vocal folds were white without mass or lesion.  Base of tongue was within normal limits.   Assessment 1.  Multinodular thyroid goiter.  Her right nodule was found to be benign on FNA.  However, her left thyroid nodule has a 50% chance of being malignant on molecular genetic testing.   2.  The patient's vocal cords are noted to be symmetric and mobile.  3.  Mild intermittent compressive symptoms.    Plan  1.  The physical exam and laryngoscopy findings are reviewed with the patient.  2.  The [facility] testing results  are extensively discussed.  3.  Based on the above findings, the patient will benefit from undergoing left hemithyroidectomy surgery.  In case of malignancy, the possible need for completion thyroidectomy and postop radioactive iodine ablation are  also discussed.  Questions are invited and answered.  4.  The patient would like to proceed with the left hemithyroidectomy surgery.

## 2020-05-11 NOTE — Anesthesia Postprocedure Evaluation (Signed)
Anesthesia Post Note  Patient: Candice [name]  Procedure(s) Performed: LEFT HEMI THYROIDECTOMY (Left Neck)     Patient location during evaluation: PACU Anesthesia Type: General Level of consciousness: awake and alert Pain management: pain level controlled Vital Signs Assessment: post-procedure vital signs reviewed and stable Respiratory status: spontaneous breathing, nonlabored ventilation and respiratory function stable Cardiovascular status: blood pressure returned to baseline and stable Postop Assessment: no apparent nausea or vomiting Anesthetic complications: no   No complications documented.  Last Vitals:  Vitals:   05/11/20 1300 05/11/20 1315  BP: 126/77 133/78  Pulse: 94 92  Resp: 14 11  Temp:    SpO2: 95% 95%    Last Pain:  Vitals:   05/11/20 1315  TempSrc:   PainSc: Asleep                 Lynda Rainwater

## 2020-05-11 NOTE — Transfer of Care (Signed)
Immediate Anesthesia Transfer of Care Note  Patient: Candice [name]  Procedure(s) Performed: LEFT HEMI THYROIDECTOMY (Left Neck)  Patient Location: PACU  Anesthesia Type:General  Level of Consciousness: sedated  Airway & Oxygen Therapy: Patient Spontanous Breathing and Patient connected to face mask oxygen  Post-op Assessment: Report given to RN and Post -op Vital signs reviewed and stable  Post vital signs: Reviewed and stable  Last Vitals:  Vitals Value Taken Time  BP 137/68 05/11/20 1135  Temp    Pulse 93 05/11/20 1137  Resp 16 05/11/20 1137  SpO2 96 % 05/11/20 1137  Vitals shown include unvalidated device data.  Last Pain:  Vitals:   05/11/20 1135  TempSrc:   PainSc: (P) Asleep         Complications: No complications documented.

## 2020-05-12 ENCOUNTER — Encounter (HOSPITAL_BASED_OUTPATIENT_CLINIC_OR_DEPARTMENT_OTHER): Payer: Self-pay | Admitting: Otolaryngology

## 2020-05-12 DIAGNOSIS — E042 Nontoxic multinodular goiter: Secondary | ICD-10-CM | POA: Diagnosis not present

## 2020-05-12 LAB — SURGICAL PATHOLOGY

## 2020-05-12 NOTE — Discharge Instructions (Signed)
Thyroidectomy, Care After This sheet gives you information about how to care for yourself after your procedure. Your health care provider may also give you more specific instructions. If you have problems or questions, contact your health care provider. What can I expect after the procedure? After the procedure, it is common to have:  Mild pain in the neck or upper body, especially when swallowing.  A swollen neck.  A sore throat.  A weak or hoarse voice.  Slight tingling or numbness around your mouth, or in your fingers or toes. This may last for a day or two after surgery. This condition is caused by low levels of calcium. You may be given calcium supplements to treat it. Follow these instructions at home:  Medicines  Take over-the-counter and prescription medicines only as told by your health care provider.  Do not drive or use heavy machinery while taking prescription pain medicine.  Do not take medicines that contain aspirin and ibuprofen until your health care provider says that you can. These medicines can increase your risk of bleeding. Eating and drinking  To prevent or treat constipation while you are taking prescription pain medicine, your health care provider may recommend that you: ? Drink enough fluid to keep your urine pale yellow. ? Take over-the-counter or prescription medicines. ? Eat foods that are high in fiber, such as fresh fruits and vegetables, whole grains, and beans. ? Limit foods that are high in fat and processed sugars, such as fried and sweet foods. Incision care  Follow instructions from your health care provider about how to take care of your incision. Make sure you: ? Wash your hands with soap and water before you change your bandage (dressing). If soap and water are not available, use hand sanitizer. ? Change your dressing as told by your health care provider. ? Leave stitches (sutures), skin glue, or adhesive strips in place. These skin closures  may need to stay in place for 2 weeks or longer. If adhesive strip edges start to loosen and curl up, you may trim the loose edges. Do not remove adhesive strips completely unless your health care provider tells you to do that.  Check your incision area every day for signs of infection. Check for: ? Redness, swelling, or pain. ? Fluid or blood. ? Warmth. ? Pus or a bad smell. Activity  For the first 10 days after the procedure or as instructed by your health care provider: ? Do not lift anything that is heavier than 10 lb (4.5 kg). ? Do not jog, swim, or do other strenuous exercises. ? Do not play contact sports.  Avoid sitting for a long time without moving. Get up to take short walks every 1-2 hours. This is needed to improve blood flow and breathing. Ask for help if you feel weak or unsteady.  Return to your normal activities as told by your health care provider. Ask your health care provider what activities are safe for you. General instructions  Do not use any products that contain nicotine or tobacco, such as cigarettes and e-cigarettes. These can delay healing after surgery. If you need help quitting, ask your health care provider.  Keep all follow-up visits as told by your health care provider. This is important. Your health care provider needs to monitor the calcium level in your blood to make sure that it does not become low. Contact a health care provider if you:  Have a fever.  Have more redness, swelling, or pain around your  incision area.  Have fluid or blood coming from your incision area.  Notice that your incision area feels warm to the touch.  Have pus or a bad smell coming from your incision area.  Have trouble talking.  Have nausea or vomiting for more than 2 days. Get help right away if you:  Have trouble breathing.  Have trouble swallowing.  Develop a rash.  Develop a cough that gets worse.  Notice that your speech changes, or you have hoarseness  that gets worse.  Develop numbness, tingling, or muscle spasms in the arms, hands, feet, or face. Summary  After the procedure, it is common to feel mild pain in the neck or upper body, especially when swallowing.  Take medicines as told by your health care provider. These include pain medicines and thyroid hormones, if required.  Follow instructions from your health care provider about how to take care of your incision. Watch for signs of infection.  Keep all follow-up visits as told by your health care provider. This is important. Your health care provider needs to monitor the calcium level in your blood to make sure that it does not become low.  Get help right away if you develop difficulty breathing, or numbness, tingling, or muscle spasms in the arms, hands, feet, or face. This information is not intended to replace advice given to you by your health care provider. Make sure you discuss any questions you have with your health care provider. Document Revised: 12/26/2018 Document Reviewed: 09/04/2017 Elsevier Patient Education  2020 [employer].

## 2020-05-12 NOTE — Discharge Summary (Signed)
Physician Discharge Summary  Patient ID: Candice [name] MRN: [id number] DOB/AGE: Apr 11, 1951 69 y.o.  Admit date: 05/11/2020 Discharge date: 05/12/2020  Admission Diagnoses: Left thyroid mass  Discharge Diagnoses: Left thyroid mass Active Problems:   S/P partial thyroidectomy   Discharged Condition: good  Hospital Course: Pt had an uneventful overnight stay. Pt tolerated po well. No bleeding. No stridor.  Consults: None  Significant Diagnostic Studies: None  Treatments: surgery: Left hemithyroidectomy  Discharge Exam: Blood pressure 126/69, pulse 80, temperature 97.9 F (36.6 C), resp. rate 16, height 5\' 6"  (1.676 m), weight 92.5 kg, SpO2 96 %. Incision/Wound:c/d/i Voice is strong.  Disposition: Discharge disposition: 01-Home or Self Care       Discharge Instructions    Activity as tolerated - No restrictions   Complete by: As directed    Diet general   Complete by: As directed    No wound care   Complete by: As directed      Allergies as of 05/12/2020      Reactions   Codeine Nausea Only   Sulfur Rash      Medication List    STOP taking these medications   aspirin 325 MG tablet     TAKE these medications   acyclovir 400 MG tablet Commonly known as: ZOVIRAX Take 400 mg by mouth daily.   amoxicillin 875 MG tablet Commonly known as: AMOXIL Take 1 tablet (875 mg total) by mouth 2 (two) times daily for 3 days.   APPLE CIDER VINEGAR PO Take 3-4 tablets by mouth daily. [place]oli Apple Cider Gummies   BLACK CURRANT SEED OIL PO Take 5 mLs by mouth daily. Cold Pressed Black Seed Oil   BRAIN PO Take 1 tablet by mouth at bedtime. Neuriva Original Brain Performance   budesonide-formoterol 160-4.5 MCG/ACT inhaler Commonly known as: SYMBICORT Inhale 2 puffs into the lungs 2 (two) times daily.   citalopram 20 MG tablet Commonly known as: CELEXA Take 20 mg by mouth daily. In the morning   clonazePAM 0.5 MG tablet Commonly known as: KLONOPIN Take 0.25 mg  by mouth at bedtime.   COQ-10 PO Take 15 mLs by mouth daily.   cyclobenzaprine 10 MG tablet Commonly known as: FLEXERIL Take 10 mg by mouth at bedtime as needed (pain/spasms).   gabapentin 100 MG capsule Commonly known as: NEURONTIN Take 100 mg by mouth at bedtime.   GLUCOSAMINE CHONDROITIN TRIPLE PO Take 1 tablet by mouth daily.   OSTEO BI-FLEX ADV TRIPLE ST PO Take 1 tablet by mouth daily.   HAIR/SKIN/NAILS PO Take 1 tablet by mouth daily.   Linzess 145 MCG Caps capsule Generic drug: linaclotide Take 145 mcg by mouth daily as needed (for constipation).   loratadine 10 MG tablet Commonly known as: CLARITIN Take 10 mg by mouth daily.   olmesartan-hydrochlorothiazide 20-12.5 MG tablet Commonly known as: BENICAR HCT Take 1 tablet by mouth daily.   orlistat 60 MG capsule Commonly known as: ALLI Take 60 mg by mouth 3 (three) times daily with meals.   oxyCODONE-acetaminophen 5-325 MG tablet Commonly known as: Percocet Take 1 tablet by mouth every 4 (four) hours as needed for up to 3 days for severe pain.   pantoprazole 40 MG tablet Commonly known as: PROTONIX Take 40 mg by mouth daily. Heartburn   ProAir HFA 108 (90 Base) MCG/ACT inhaler Generic drug: albuterol Take 2 puffs by mouth 4 (four) times daily as needed for wheezing or shortness of breath.   simvastatin 20 MG tablet Commonly known as:  ZOCOR Take 20 mg by mouth at bedtime.   Vitamin B-12 2500 MCG Subl Take 2,500 mcg by mouth daily.   Vitamin D3 10 MCG (400 UNIT) tablet Take 400 Units by mouth daily.       Follow-up Information    Leta Baptist, MD On 05/18/2020.   Specialty: Otolaryngology Why: at 1:50pm Contact information: [address] [place] [address] [phone]               Signed: Burley Saver 05/12/2020, 7:43 AM

## 2020-06-11 DIAGNOSIS — D508 Other iron deficiency anemias: Secondary | ICD-10-CM | POA: Diagnosis not present

## 2020-06-11 DIAGNOSIS — I10 Essential (primary) hypertension: Secondary | ICD-10-CM | POA: Diagnosis not present

## 2020-06-11 DIAGNOSIS — E7849 Other hyperlipidemia: Secondary | ICD-10-CM | POA: Diagnosis not present

## 2020-06-17 ENCOUNTER — Ambulatory Visit: Payer: Medicare Other | Admitting: Neurology

## 2020-06-24 ENCOUNTER — Other Ambulatory Visit: Payer: Self-pay

## 2020-06-24 ENCOUNTER — Inpatient Hospital Stay (HOSPITAL_COMMUNITY): Payer: Medicare Other | Attending: Hematology

## 2020-06-24 DIAGNOSIS — D509 Iron deficiency anemia, unspecified: Secondary | ICD-10-CM | POA: Diagnosis not present

## 2020-06-24 DIAGNOSIS — E890 Postprocedural hypothyroidism: Secondary | ICD-10-CM | POA: Diagnosis not present

## 2020-06-24 DIAGNOSIS — D649 Anemia, unspecified: Secondary | ICD-10-CM

## 2020-06-24 LAB — IRON AND TIBC
Iron: 58 ug/dL (ref 28–170)
Saturation Ratios: 18 % (ref 10.4–31.8)
TIBC: 329 ug/dL (ref 250–450)
UIBC: 271 ug/dL

## 2020-06-24 LAB — CBC WITH DIFFERENTIAL/PLATELET
Abs Immature Granulocytes: 0.02 10*3/uL (ref 0.00–0.07)
Basophils Absolute: 0 10*3/uL (ref 0.0–0.1)
Basophils Relative: 0 %
Eosinophils Absolute: 0.1 10*3/uL (ref 0.0–0.5)
Eosinophils Relative: 2 %
HCT: 37.6 % (ref 36.0–46.0)
Hemoglobin: 11.9 g/dL — ABNORMAL LOW (ref 12.0–15.0)
Immature Granulocytes: 0 %
Lymphocytes Relative: 16 %
Lymphs Abs: 1.1 10*3/uL (ref 0.7–4.0)
MCH: 31.1 pg (ref 26.0–34.0)
MCHC: 31.6 g/dL (ref 30.0–36.0)
MCV: 98.2 fL (ref 80.0–100.0)
Monocytes Absolute: 0.4 10*3/uL (ref 0.1–1.0)
Monocytes Relative: 5 %
Neutro Abs: 5.4 10*3/uL (ref 1.7–7.7)
Neutrophils Relative %: 77 %
Platelets: 253 10*3/uL (ref 150–400)
RBC: 3.83 MIL/uL — ABNORMAL LOW (ref 3.87–5.11)
RDW: 15 % (ref 11.5–15.5)
WBC: 7 10*3/uL (ref 4.0–10.5)
nRBC: 0 % (ref 0.0–0.2)

## 2020-06-24 LAB — FERRITIN: Ferritin: 59 ng/mL (ref 11–307)

## 2020-06-30 DIAGNOSIS — H5203 Hypermetropia, bilateral: Secondary | ICD-10-CM | POA: Diagnosis not present

## 2020-06-30 DIAGNOSIS — H52223 Regular astigmatism, bilateral: Secondary | ICD-10-CM | POA: Diagnosis not present

## 2020-06-30 DIAGNOSIS — H524 Presbyopia: Secondary | ICD-10-CM | POA: Diagnosis not present

## 2020-06-30 DIAGNOSIS — H2513 Age-related nuclear cataract, bilateral: Secondary | ICD-10-CM | POA: Diagnosis not present

## 2020-07-01 ENCOUNTER — Inpatient Hospital Stay (HOSPITAL_BASED_OUTPATIENT_CLINIC_OR_DEPARTMENT_OTHER): Payer: Medicare Other | Admitting: Hematology

## 2020-07-01 DIAGNOSIS — D508 Other iron deficiency anemias: Secondary | ICD-10-CM

## 2020-07-01 NOTE — Progress Notes (Signed)
Virtual Visit via Telephone Note  I connected with Candice [name] on 07/01/20 at  3:15 PM EDT by telephone and verified that I am speaking with the correct person using two identifiers.   I discussed the limitations, risks, security and privacy concerns of performing an evaluation and management service by telephone and the availability of in person appointments. I also discussed with the patient that there may be a patient responsible charge related to this service. The patient expressed understanding and agreed to proceed.   History of Present Illness: She is evaluated in our clinic for normocytic anemia requiring frequent iron infusions.  Last Shirlean Kelly was in June 2021.  She was also found to have left thyroid biopsy with Bethesda category 3 follicular lesion of undetermined significance.   Observations/Objective: Denies black stools, but sees occasional blood on the tissue paper.  Reports that her swallowing ability has improved since surgery.  She has recovered well from recent thyroid left lobectomy.  Denies any chest pains or lightheadedness.  Assessment and Plan:  1.  Normocytic anemia: -Combination anemia from relative iron deficiency and CKD and blood loss. -Labs show ferritin is 59 with percent saturation of 18.  Hemoglobin is 11.9. -Recommended 2 more infusions of Feraheme weekly x2. -We will see her back in 3 months for follow-up with repeat labs.  2.  Left thyroid lobectomy: -She underwent left thyroid lobectomy on 05/11/2020, pathology consistent with no evidence of malignancy.  Thyroid lobe with benign nodular hyperplasia.   Follow Up Instructions: RTC 3 months with labs   I discussed the assessment and treatment plan with the patient. The patient was provided an opportunity to ask questions and all were answered. The patient agreed with the plan and demonstrated an understanding of the instructions.   The patient was advised to call back or seek an in-person  evaluation if the symptoms worsen or if the condition fails to improve as anticipated.  I provided 11 minutes of non-face-to-face time during this encounter.   Derek Jack, MD

## 2020-07-06 ENCOUNTER — Other Ambulatory Visit: Payer: Self-pay

## 2020-07-06 ENCOUNTER — Inpatient Hospital Stay (HOSPITAL_COMMUNITY): Payer: Medicare Other

## 2020-07-06 VITALS — BP 118/54 | HR 76 | Temp 97.3°F | Resp 18

## 2020-07-06 DIAGNOSIS — D509 Iron deficiency anemia, unspecified: Secondary | ICD-10-CM | POA: Diagnosis not present

## 2020-07-06 DIAGNOSIS — E890 Postprocedural hypothyroidism: Secondary | ICD-10-CM | POA: Diagnosis not present

## 2020-07-06 DIAGNOSIS — D649 Anemia, unspecified: Secondary | ICD-10-CM

## 2020-07-06 DIAGNOSIS — D508 Other iron deficiency anemias: Secondary | ICD-10-CM

## 2020-07-06 MED ORDER — SODIUM CHLORIDE 0.9 % IV SOLN: Freq: Once | INTRAVENOUS | Status: AC

## 2020-07-06 MED ORDER — SODIUM CHLORIDE 0.9 % IV SOLN
510.0000 mg | Freq: Once | INTRAVENOUS | Status: AC
  Administered 2020-07-06: 510 mg via INTRAVENOUS
  Filled 2020-07-06: qty 510

## 2020-07-06 NOTE — Patient Instructions (Signed)

## 2020-07-06 NOTE — Progress Notes (Signed)
Patient tolerated Iron infusion well without complaints or incident. Peripheral IV site checked with positive blood return noted prior to and after infusion. VSS upon discharge. Pt discharged in satisfactory condition and ambulatory. Pt verbalized understanding of discharge instructions and will follow up with cancer center as scheduled.

## 2020-07-13 ENCOUNTER — Inpatient Hospital Stay (HOSPITAL_COMMUNITY): Payer: Medicare Other

## 2020-07-13 ENCOUNTER — Encounter (HOSPITAL_COMMUNITY): Payer: Self-pay

## 2020-07-13 ENCOUNTER — Other Ambulatory Visit: Payer: Self-pay

## 2020-07-13 VITALS — BP 120/65 | HR 71 | Temp 97.7°F | Resp 18

## 2020-07-13 DIAGNOSIS — E890 Postprocedural hypothyroidism: Secondary | ICD-10-CM | POA: Diagnosis not present

## 2020-07-13 DIAGNOSIS — D509 Iron deficiency anemia, unspecified: Secondary | ICD-10-CM | POA: Diagnosis not present

## 2020-07-13 DIAGNOSIS — D649 Anemia, unspecified: Secondary | ICD-10-CM

## 2020-07-13 DIAGNOSIS — D508 Other iron deficiency anemias: Secondary | ICD-10-CM

## 2020-07-13 MED ORDER — SODIUM CHLORIDE 0.9 % IV SOLN: Freq: Once | INTRAVENOUS | Status: AC

## 2020-07-13 MED ORDER — SODIUM CHLORIDE 0.9 % IV SOLN
510.0000 mg | Freq: Once | INTRAVENOUS | Status: AC
  Administered 2020-07-13: 510 mg via INTRAVENOUS
  Filled 2020-07-13: qty 510

## 2020-07-13 NOTE — Progress Notes (Signed)
Patient presents today for Feraheme infusion. Vital signs stable. Patient has no complaints of any pain today or changes since her last visit.   Feraheme given today per MD orders. Tolerated infusion without adverse affects. Vital signs stable. No complaints at this time. Discharged from clinic ambulatory. F/U with [facility] as scheduled.

## 2020-07-13 NOTE — Patient Instructions (Signed)
[facility]  Discharge Instructions:   _______________________________________________________________  Thank you for choosing [facility] to provide your oncology and hematology care.  To afford each patient quality time with our providers, please arrive at least 15 minutes before your scheduled appointment.  You need to re-schedule your appointment if you arrive 10 or more minutes late.  We strive to give you quality time with our providers, and arriving late affects you and other patients whose appointments are after yours.  Also, if you no show three or more times for appointments you may be dismissed from the clinic.  Again, thank you for choosing  [facility]. Our hope is that these requests will allow you access to exceptional care and in a timely manner. _______________________________________________________________  If you have questions after your visit, please contact our office at [phone] between the hours of 8:30 a.m. and 5:00 p.m. Voicemails left after 4:30 p.m. will not be returned until the following business day. _______________________________________________________________  For prescription refill requests, have your pharmacy contact our office. _______________________________________________________________  Recommendations made by the consultant and any test results will be sent to your referring physician. _______________________________________________________________ 

## 2020-07-21 DIAGNOSIS — Z23 Encounter for immunization: Secondary | ICD-10-CM | POA: Diagnosis not present

## 2020-07-28 ENCOUNTER — Other Ambulatory Visit: Payer: Self-pay

## 2020-07-28 ENCOUNTER — Encounter: Payer: Self-pay | Admitting: Neurology

## 2020-07-28 ENCOUNTER — Ambulatory Visit (INDEPENDENT_AMBULATORY_CARE_PROVIDER_SITE_OTHER): Payer: Medicare Other | Admitting: Neurology

## 2020-07-28 VITALS — BP 117/59 | HR 76 | Ht 66.0 in | Wt 209.0 lb

## 2020-07-28 DIAGNOSIS — G5601 Carpal tunnel syndrome, right upper limb: Secondary | ICD-10-CM

## 2020-07-28 NOTE — Patient Instructions (Addendum)
Your exam is benign and reassuring. Your recent EMG and nerve conduction velocity testing through our office showed mild carpal tunnel on the right side.  Using the splint at night can certainly help.  If you have worse symptoms over time with carpal tunnel, we can consult with a hand surgeon.   If you have issues with back pain especially with sciatica type symptoms, we can certainly look into the possibility of degenerative spine disease and order an MRI of the lumbar spine.  For now, please continue to stay active, stay well rested, well hydrated with water.  I would be happy to see you back as needed.
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
## 2020-07-29 ENCOUNTER — Ambulatory Visit: Payer: Medicare Other | Attending: Internal Medicine

## 2020-07-29 DIAGNOSIS — Z23 Encounter for immunization: Secondary | ICD-10-CM

## 2020-07-29 NOTE — Progress Notes (Signed)
   Covid-19 Vaccination Clinic  Name:  Candice [name]    MRN: [id number] DOB: 08-14-1951  07/29/2020  [name] was observed post Covid-19 immunization for 15 minutes without incident. She was provided with Vaccine Information Sheet and instruction to access the V-Safe system.   [name] was instructed to call 911 with any severe reactions post vaccine: Marland Kitchen Difficulty breathing  . Swelling of face and throat  . A fast heartbeat  . A bad rash all over body  . Dizziness and weakness

## 2020-08-12 DIAGNOSIS — D508 Other iron deficiency anemias: Secondary | ICD-10-CM | POA: Diagnosis not present

## 2020-08-12 DIAGNOSIS — I10 Essential (primary) hypertension: Secondary | ICD-10-CM | POA: Diagnosis not present

## 2020-08-12 DIAGNOSIS — E7849 Other hyperlipidemia: Secondary | ICD-10-CM | POA: Diagnosis not present

## 2020-08-18 IMAGING — US US BIOPSY FNA W/ IMAGING
1 series · 13 of 25 positions shown · non-contrast
Comparison: US Thyroid 02/05/2020

MEDICATIONS:
13 cc 2% lidocaine

COMPLICATIONS:
None immediate.

INDICATION: Indeterminate thyroid nodule

Right mid lobe thyroid nodule; 2.6 cm
Left mid lobe thyroid nodule; 5.4 cm
EXAM:
ULTRASOUND GUIDED FINE NEEDLE ASPIRATION OF INDETERMINATE THYROID
NODULE
ULTRASOUND GUIDED FINE NEEDLE ASPIRATION OF ADDITIONAL INDETERMINATE
THYROID NODULE
TECHNIQUE: Informed written consent was obtained from the patient after a
discussion of the risks, benefits and alternatives to treatment.
Questions regarding the procedure were encouraged and answered. A
timeout was performed prior to the initiation of the procedure.

[Series 1: us biopsy fna w/ imaging · 29 acquisitions, 13 frames shown]
[im 1/29]
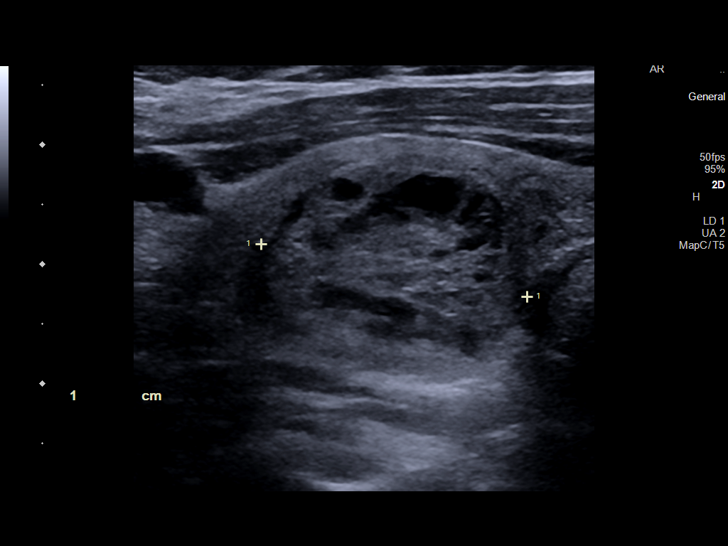
[im 3/29]
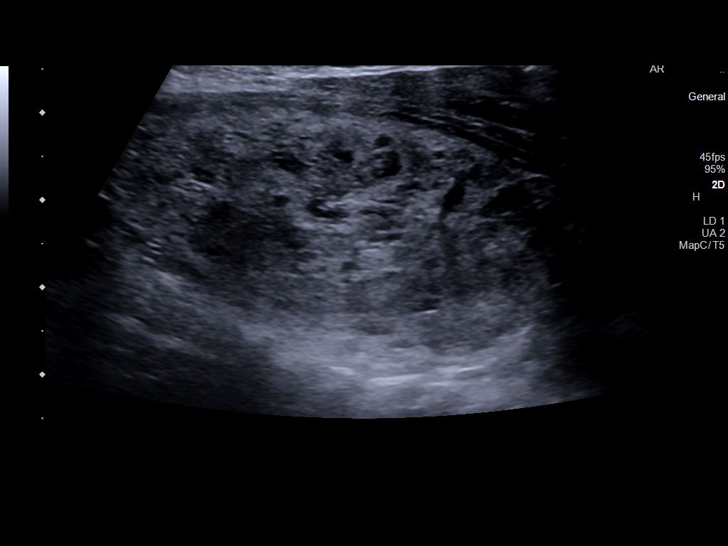
[im 5/29]
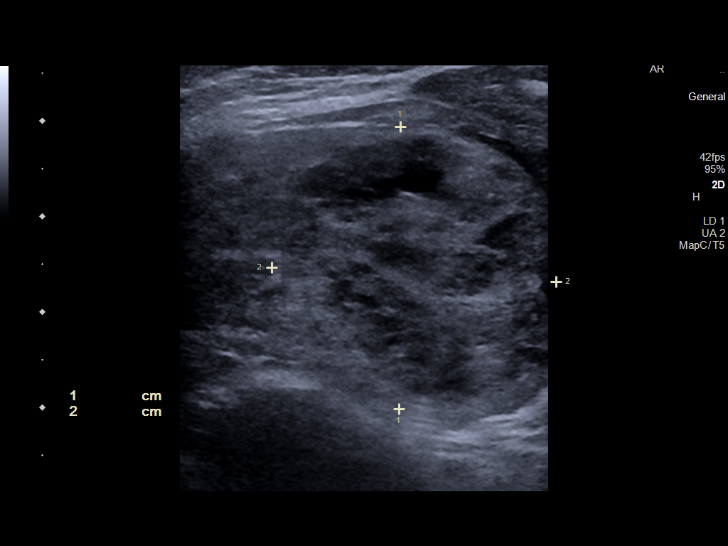
[im 8/29]
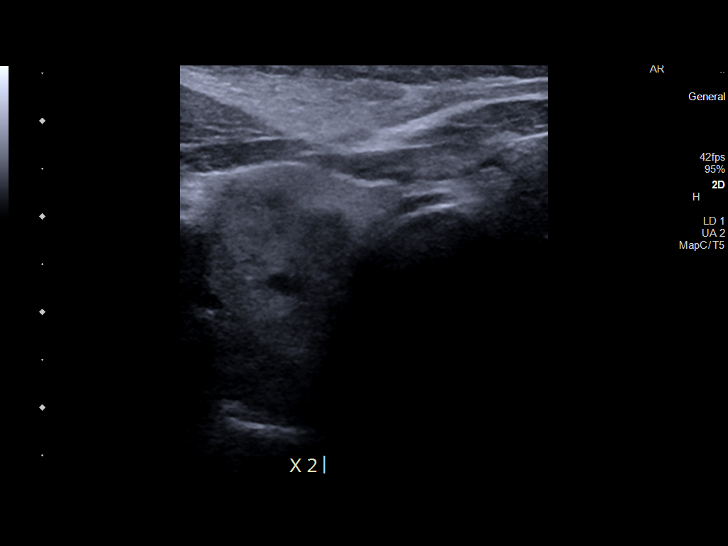
[im 10/29]
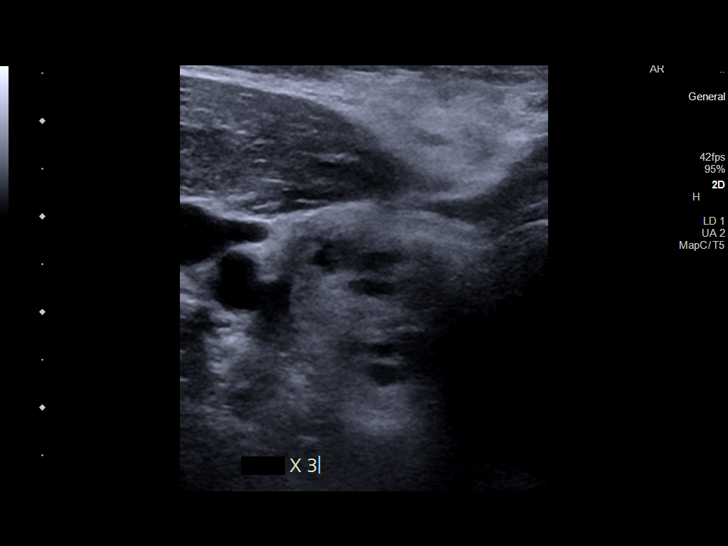
[im 12/29]
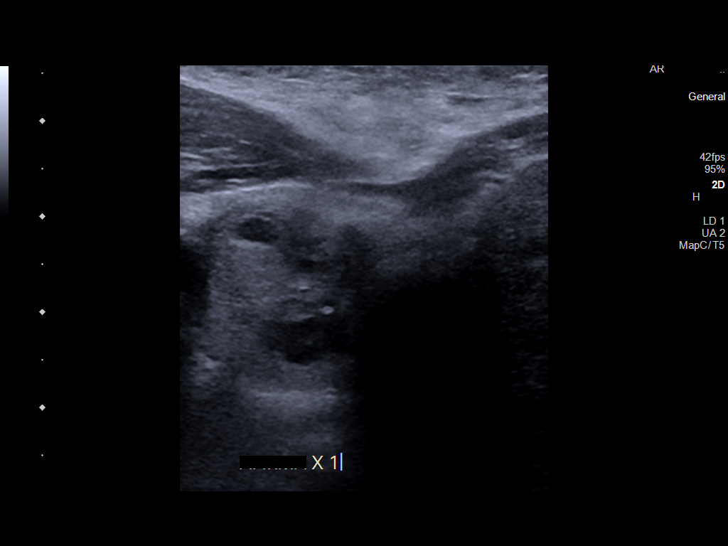
[im 15/29]
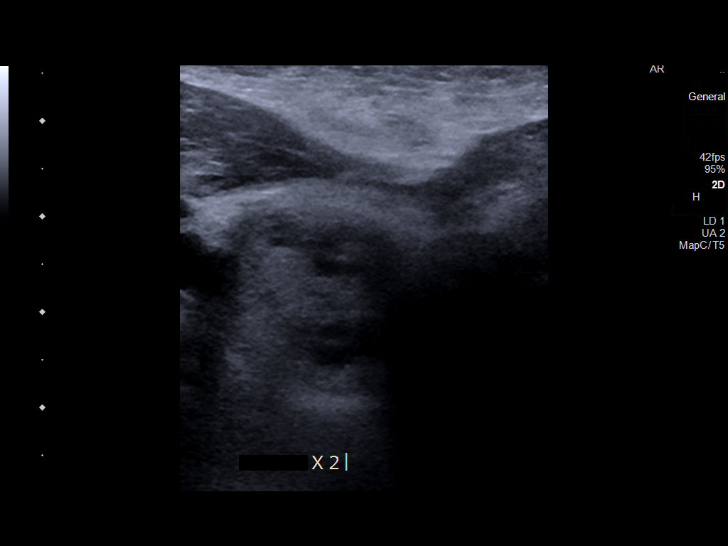
[im 17/29]
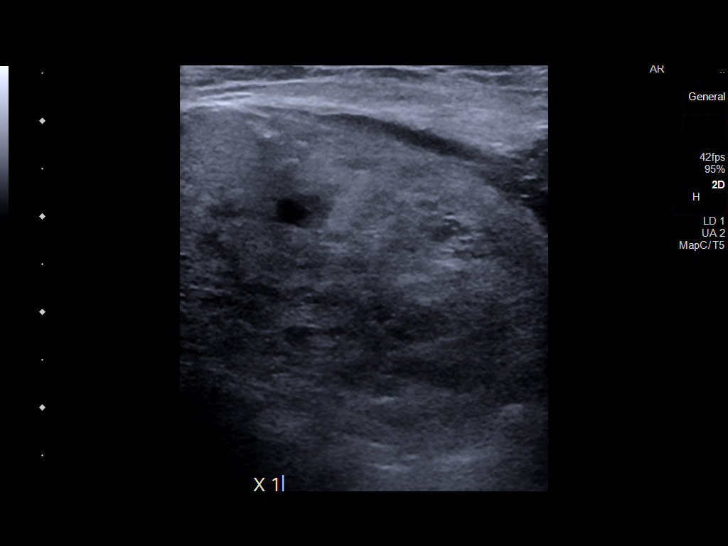
[im 19/29]
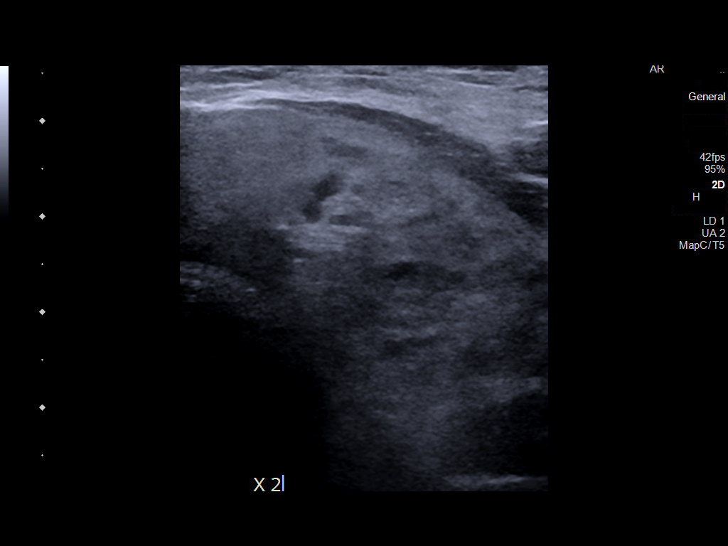
[im 22/29]
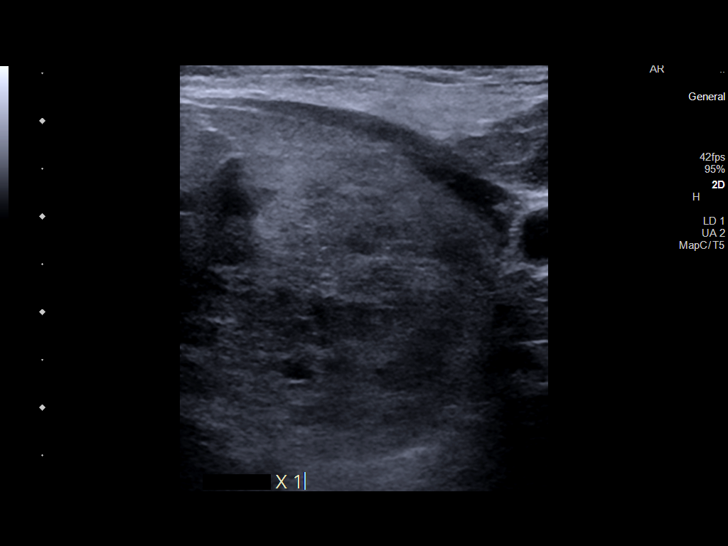
[im 24/29]
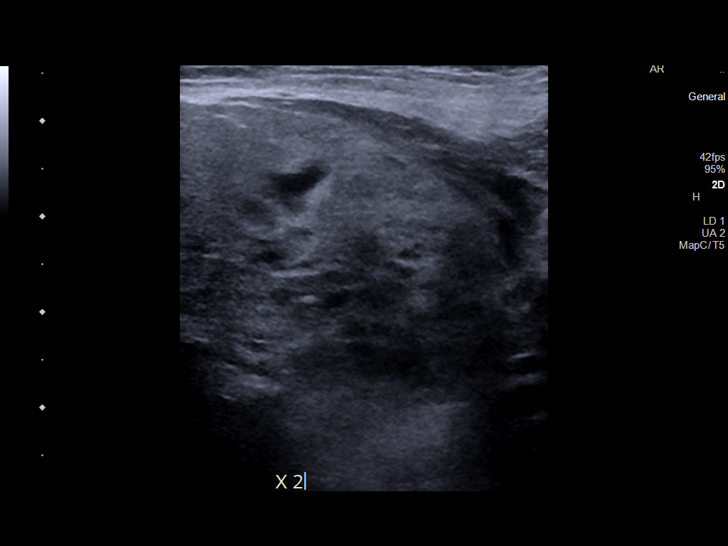
[im 26/29]
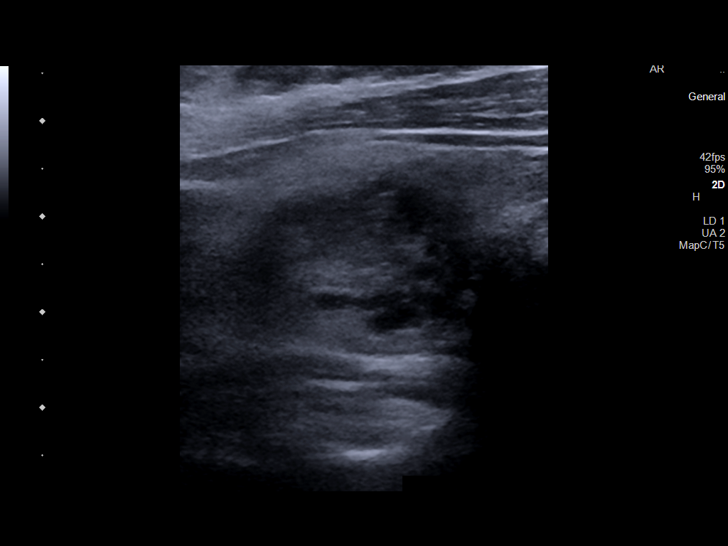
[im 29/29]
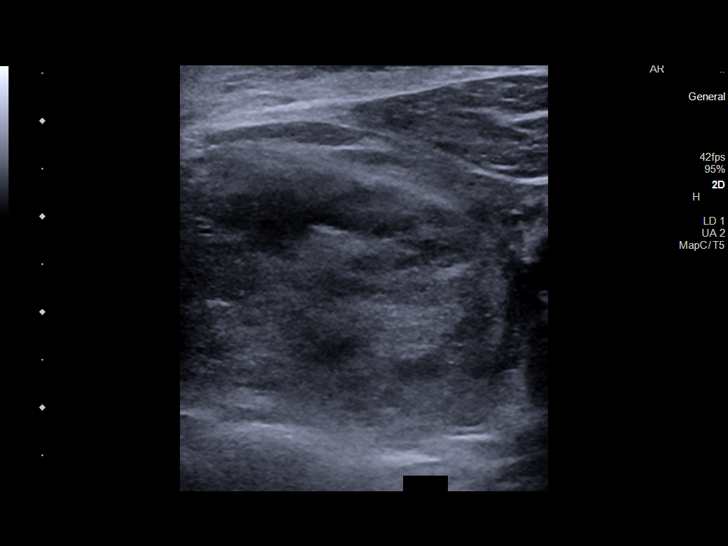

[13 of 25 positions shown; findings below may reference images not displayed]

Pre-procedural ultrasound scanning demonstrated unchanged size and
appearance of the indeterminate nodules within the right and left
thyroid

The procedure was planned. The neck was prepped in the usual sterile
fashion, and a sterile drape was applied covering the operative
field. A timeout was performed prior to the initiation of the
procedure. Local anesthesia was provided with 1% lidocaine.

Under direct ultrasound guidance, 5 FNA biopsies were performed of
the right mid lobe thyroid nodule with a 25 gauge needle.

2 of these samples were obtained for AFIRMA as per ordering LASHALASHA

Multiple ultrasound images were saved for procedural documentation
purposes. The samples were prepared and submitted to pathology.

Under direct ultrasound guidance, 5 FNA biopsies were performed of
the left mid lobe thyroid nodule with a 25 gauge needle.

2 of these samples were obtained for AFIRMA as per ordering LASHALASHA

Multiple ultrasound images were saved for procedural documentation
purposes. The samples were prepared and submitted to pathology.

Limited post procedural scanning was negative for hematoma or
additional complication. Dressings were placed. The patient
tolerated the above procedures procedure well without immediate
postprocedural complication.
FINDINGS: Nodule reference number based on prior diagnostic ultrasound: 2

Maximum size: 2.6 cm

Location: Right; Mid

ACR TI-RADS risk category: TR3 (3 points)

Reason for biopsy: meets ACR TI-RADS criteria

_________________________________________________________

Nodule reference number based on prior diagnostic ultrasound: 3

Maximum size: 5.4  Cm

Location: Left; Mid

ACR TI-RADS risk category: TR3 (3 points)

Reason for biopsy: meets ACR TI-RADS criteria

Ultrasound imaging confirms appropriate placement of the needles
within the thyroid nodule.
IMPRESSION: 1. Technically successful ultrasound guided fine needle aspiration
of right mid lobe thyroid nodule
2. Technically successful ultrasound guided fine needle aspiration
of left mid lobe thyroid nodule

Read by

Josie Marzan

## 2020-08-19 DIAGNOSIS — F331 Major depressive disorder, recurrent, moderate: Secondary | ICD-10-CM | POA: Diagnosis not present

## 2020-08-19 DIAGNOSIS — D508 Other iron deficiency anemias: Secondary | ICD-10-CM | POA: Diagnosis not present

## 2020-08-19 DIAGNOSIS — F411 Generalized anxiety disorder: Secondary | ICD-10-CM | POA: Diagnosis not present

## 2020-08-19 DIAGNOSIS — K2100 Gastro-esophageal reflux disease with esophagitis, without bleeding: Secondary | ICD-10-CM | POA: Diagnosis not present

## 2020-08-19 DIAGNOSIS — I10 Essential (primary) hypertension: Secondary | ICD-10-CM | POA: Diagnosis not present

## 2020-08-19 DIAGNOSIS — R739 Hyperglycemia, unspecified: Secondary | ICD-10-CM | POA: Diagnosis not present

## 2020-08-19 DIAGNOSIS — J45909 Unspecified asthma, uncomplicated: Secondary | ICD-10-CM | POA: Diagnosis not present

## 2020-08-19 DIAGNOSIS — E782 Mixed hyperlipidemia: Secondary | ICD-10-CM | POA: Diagnosis not present

## 2020-08-19 DIAGNOSIS — Z6834 Body mass index (BMI) 34.0-34.9, adult: Secondary | ICD-10-CM | POA: Diagnosis not present

## 2020-08-19 DIAGNOSIS — Z0001 Encounter for general adult medical examination with abnormal findings: Secondary | ICD-10-CM | POA: Diagnosis not present

## 2020-08-25 ENCOUNTER — Other Ambulatory Visit: Payer: Self-pay | Admitting: Neurosurgery

## 2020-08-25 DIAGNOSIS — I671 Cerebral aneurysm, nonruptured: Secondary | ICD-10-CM

## 2020-09-06 ENCOUNTER — Other Ambulatory Visit (HOSPITAL_COMMUNITY)
Admission: RE | Admit: 2020-09-06 | Discharge: 2020-09-06 | Disposition: A | Discharge status: Home / Self Care | Payer: Medicare Other | Source: Ambulatory Visit | Attending: Neurosurgery | Admitting: Neurosurgery

## 2020-09-06 DIAGNOSIS — Z01812 Encounter for preprocedural laboratory examination: Secondary | ICD-10-CM | POA: Diagnosis not present

## 2020-09-06 DIAGNOSIS — Z20822 Contact with and (suspected) exposure to covid-19: Secondary | ICD-10-CM | POA: Diagnosis not present

## 2020-09-06 LAB — SARS CORONAVIRUS 2 (TAT 6-24 HRS): SARS Coronavirus 2: NEGATIVE
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
## 2020-09-09 ENCOUNTER — Ambulatory Visit (HOSPITAL_COMMUNITY)
Admission: RE | Admit: 2020-09-09 | Discharge: 2020-09-09 | Disposition: A | Discharge status: Home / Self Care | Payer: Medicare Other | Source: Ambulatory Visit | Attending: Neurosurgery | Admitting: Neurosurgery

## 2020-09-09 ENCOUNTER — Other Ambulatory Visit: Payer: Self-pay | Admitting: Neurosurgery

## 2020-09-09 ENCOUNTER — Other Ambulatory Visit: Payer: Self-pay

## 2020-09-09 DIAGNOSIS — I671 Cerebral aneurysm, nonruptured: Secondary | ICD-10-CM | POA: Diagnosis not present

## 2020-09-09 DIAGNOSIS — Z882 Allergy status to sulfonamides status: Secondary | ICD-10-CM | POA: Diagnosis not present

## 2020-09-09 DIAGNOSIS — Z87891 Personal history of nicotine dependence: Secondary | ICD-10-CM | POA: Insufficient documentation

## 2020-09-09 DIAGNOSIS — Z885 Allergy status to narcotic agent status: Secondary | ICD-10-CM | POA: Insufficient documentation

## 2020-09-09 HISTORY — PX: IR ANGIO INTRA EXTRACRAN SEL COM CAROTID INNOMINATE BILAT MOD SED: IMG5360

## 2020-09-09 HISTORY — PX: IR US GUIDE VASC ACCESS RIGHT: IMG2390

## 2020-09-09 LAB — CBC WITH DIFFERENTIAL/PLATELET
Abs Immature Granulocytes: 0.01 10*3/uL (ref 0.00–0.07)
Basophils Absolute: 0 10*3/uL (ref 0.0–0.1)
Basophils Relative: 1 %
Eosinophils Absolute: 0.2 10*3/uL (ref 0.0–0.5)
Eosinophils Relative: 4 %
HCT: 36.3 % (ref 36.0–46.0)
Hemoglobin: 11.4 g/dL — ABNORMAL LOW (ref 12.0–15.0)
Immature Granulocytes: 0 %
Lymphocytes Relative: 22 %
Lymphs Abs: 1.2 10*3/uL (ref 0.7–4.0)
MCH: 31.6 pg (ref 26.0–34.0)
MCHC: 31.4 g/dL (ref 30.0–36.0)
MCV: 100.6 fL — ABNORMAL HIGH (ref 80.0–100.0)
Monocytes Absolute: 0.5 10*3/uL (ref 0.1–1.0)
Monocytes Relative: 8 %
Neutro Abs: 3.6 10*3/uL (ref 1.7–7.7)
Neutrophils Relative %: 65 %
Platelets: 286 10*3/uL (ref 150–400)
RBC: 3.61 MIL/uL — ABNORMAL LOW (ref 3.87–5.11)
RDW: 14.9 % (ref 11.5–15.5)
WBC: 5.5 10*3/uL (ref 4.0–10.5)
nRBC: 0 % (ref 0.0–0.2)

## 2020-09-09 LAB — URINALYSIS, ROUTINE W REFLEX MICROSCOPIC
Bilirubin Urine: NEGATIVE
Glucose, UA: NEGATIVE mg/dL
Hgb urine dipstick: NEGATIVE
Ketones, ur: NEGATIVE mg/dL
Leukocytes,Ua: NEGATIVE
Nitrite: NEGATIVE
Protein, ur: NEGATIVE mg/dL
Specific Gravity, Urine: 1.017 (ref 1.005–1.030)
pH: 6 (ref 5.0–8.0)

## 2020-09-09 LAB — BASIC METABOLIC PANEL
Anion gap: 10 (ref 5–15)
BUN: 19 mg/dL (ref 8–23)
CO2: 27 mmol/L (ref 22–32)
Calcium: 9.9 mg/dL (ref 8.9–10.3)
Chloride: 102 mmol/L (ref 98–111)
Creatinine, Ser: 1.54 mg/dL — ABNORMAL HIGH (ref 0.44–1.00)
GFR, Estimated: 36 mL/min — ABNORMAL LOW (ref >60)
Glucose, Bld: 98 mg/dL (ref 70–99)
Potassium: 4 mmol/L (ref 3.5–5.1)
Sodium: 139 mmol/L (ref 135–145)

## 2020-09-09 LAB — APTT: aPTT: 30 seconds (ref 24–36)

## 2020-09-09 LAB — PROTIME-INR
INR: 1 (ref 0.8–1.2)
Prothrombin Time: 12.4 seconds (ref 11.4–15.2)

## 2020-09-09 MED ORDER — MIDAZOLAM HCL 5 MG/5ML IJ SOLN: INTRAMUSCULAR | Status: AC | PRN

## 2020-09-09 MED ORDER — LIDOCAINE HCL 1 % IJ SOLN
INTRAMUSCULAR | Status: AC | PRN
  Administered 2020-09-09: 5 mL

## 2020-09-09 MED ORDER — HEPARIN SODIUM (PORCINE) 1000 UNIT/ML IJ SOLN
INTRAMUSCULAR | Status: AC | PRN
  Administered 2020-09-09: 3000 [IU] via INTRAVENOUS

## 2020-09-09 MED ORDER — NITROGLYCERIN 1 MG/10 ML FOR IR/CATH LAB
INTRA_ARTERIAL | Status: AC
  Filled 2020-09-09: qty 10

## 2020-09-09 MED ORDER — CHLORHEXIDINE GLUCONATE CLOTH 2 % EX PADS: 6.0000 | MEDICATED_PAD | Freq: Once | CUTANEOUS | Status: DC

## 2020-09-09 MED ORDER — LIDOCAINE HCL 1 % IJ SOLN
INTRAMUSCULAR | Status: AC
  Filled 2020-09-09: qty 20

## 2020-09-09 MED ORDER — MIDAZOLAM HCL 2 MG/2ML IJ SOLN
INTRAMUSCULAR | Status: AC
  Filled 2020-09-09: qty 2

## 2020-09-09 MED ORDER — IOHEXOL 300 MG/ML  SOLN
150.0000 mL | Freq: Once | INTRAMUSCULAR | Status: AC | PRN
  Administered 2020-09-09: 50 mL via INTRA_ARTERIAL

## 2020-09-09 MED ORDER — VERAPAMIL HCL 2.5 MG/ML IV SOLN
INTRAVENOUS | Status: AC
  Filled 2020-09-09: qty 2

## 2020-09-09 MED ORDER — HEPARIN SODIUM (PORCINE) 1000 UNIT/ML IJ SOLN
INTRAMUSCULAR | Status: AC
  Filled 2020-09-09: qty 1

## 2020-09-09 MED ORDER — HYDROCODONE-ACETAMINOPHEN 5-325 MG PO TABS: 1.0000 | ORAL_TABLET | ORAL | Status: DC | PRN

## 2020-09-09 MED ORDER — FENTANYL CITRATE (PF) 100 MCG/2ML IJ SOLN
INTRAMUSCULAR | Status: AC
  Filled 2020-09-09: qty 2

## 2020-09-09 MED ORDER — FENTANYL CITRATE (PF) 100 MCG/2ML IJ SOLN
INTRAMUSCULAR | Status: AC | PRN
Start: 2020-09-09 — End: 2020-09-09
  Administered 2020-09-09: 25 ug via INTRAVENOUS

## 2020-09-09 MED ORDER — CEFAZOLIN SODIUM-DEXTROSE 2-4 GM/100ML-% IV SOLN: 2.0000 g | INTRAVENOUS | Status: DC

## 2020-09-09 MED ORDER — SODIUM CHLORIDE 0.9 % IV SOLN: INTRAVENOUS | Status: DC

## 2020-09-09 MED ORDER — MIDAZOLAM HCL 2 MG/2ML IJ SOLN
INTRAMUSCULAR | Status: AC | PRN
  Administered 2020-09-09: 1 mg via INTRAVENOUS

## 2020-09-09 NOTE — Discharge Instructions (Signed)
DRINK PLENTY OF FLUIDS FOR THE NEXT 2-3 DAYS.  KEEP ARM ELEVATED THE REMAINDER OF THE DAY.  Radial Site Care  This sheet gives you information about how to care for yourself after your procedure. Your health care provider may also give you more specific instructions. If you have problems or questions, contact your health care provider. What can I expect after the procedure? After the procedure, it is common to have:  Bruising and tenderness at the catheter insertion area. Follow these instructions at home: Medicines  Take over-the-counter and prescription medicines only as told by your health care provider. Insertion site care 1. Follow instructions from your health care provider about how to take care of your insertion site. Make sure you: ? Wash your hands with soap and water before you change your bandage (dressing). If soap and water are not available, use hand sanitizer. ? Change your dressing as told by your health care provider. 2. Check your insertion site every day for signs of infection. Check for: ? Redness, swelling, or pain. ? Fluid or blood. ? Pus or a bad smell. ? Warmth. 3. Do not take baths, swim, or use a hot tub for 5 days. 4. You may shower 24-48 hours after the procedure. ? Remove the dressing and gently wash the site with plain soap and water. ? Pat the area dry with a clean towel. ? Do not rub the site. That could cause bleeding. 5. Do not apply powder or lotion to the site. Activity  1. For 24 hours after the procedure, or as directed by your health care provider: ? Do not flex or bend the affected arm. ? Do not push or pull heavy objects with the affected arm. ? Do not drive yourself home from the hospital or clinic. You may drive 24 hours after the procedure. ? Do not operate machinery or power tools. 2. Do not push, pull or lift anything that is heavier than 10 lb for 5 days. 3. Ask your health care provider when it is okay to: ? Return to work or  school. ? Resume usual physical activities or sports. ? Resume sexual activity. General instructions  If the catheter site starts to bleed, raise your arm and put firm pressure on the site. If the bleeding does not stop, get help right away. This is a medical emergency.  If you went home on the same day as your procedure, a responsible adult should be with you for the first 24 hours after you arrive home.  Keep all follow-up visits as told by your health care provider. This is important. Contact a health care provider if:  You have a fever.  You have redness, swelling, or yellow drainage around your insertion site. Get help right away if:  You have unusual pain at the radial site.  The catheter insertion area swells very fast.  The insertion area is bleeding, and the bleeding does not stop when you hold steady pressure on the area.  Your arm or hand becomes pale, cool, tingly, or numb. These symptoms may represent a serious problem that is an emergency. Do not wait to see if the symptoms will go away. Get medical help right away. Call your local emergency services (911 in the [place] Do not drive yourself to the hospital. Summary  After the procedure, it is common to have bruising and tenderness at the site.  Follow instructions from your health care provider about how to take care of your radial site wound. Check   the wound every day for signs of infection.  Do not push, pull or lift anything that is heavier than 10 lb for 5 days.  This information is not intended to replace advice given to you by your health care provider. Make sure you discuss any questions you have with your health care provider. Document Revised: 12/05/2017 Document Reviewed: 12/05/2017 Elsevier Patient Education  2020 Elsevier Inc. 

## 2020-09-09 NOTE — Progress Notes (Signed)
Discharge instructions reviewed with pt and her grandson [place] both voice understanding.

## 2020-09-09 NOTE — Progress Notes (Signed)
Attempted to call pt grandson message left for him to call us back.

## 2020-09-09 NOTE — Progress Notes (Signed)
Ambulated to the bathroom to void tol well  

## 2020-09-09 NOTE — Brief Op Note (Signed)
  NEUROSURGERY BRIEF OPERATIVE  NOTE   PREOP DX: Brain aneurysms  POSTOP DX: Same  PROCEDURE: Diagnostic cerebral angiogram  SURGEON: Dr. Consuella Lose, MD  ANESTHESIA: IV Sedation with Local  EBL: Minimal  SPECIMENS: None  COMPLICATIONS: None  CONDITION: Stable to recovery  FINDINGS (Full report in [facility]): 1. Very slow but continued filling of previously treated left ophthalmic aneurysm 2. 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

## 2020-09-11 DIAGNOSIS — D508 Other iron deficiency anemias: Secondary | ICD-10-CM | POA: Diagnosis not present

## 2020-09-11 DIAGNOSIS — E7849 Other hyperlipidemia: Secondary | ICD-10-CM | POA: Diagnosis not present

## 2020-09-11 DIAGNOSIS — I10 Essential (primary) hypertension: Secondary | ICD-10-CM | POA: Diagnosis not present

## 2020-09-13 DIAGNOSIS — H40023 Open angle with borderline findings, high risk, bilateral: Secondary | ICD-10-CM | POA: Diagnosis not present

## 2020-09-13 DIAGNOSIS — H25813 Combined forms of age-related cataract, bilateral: Secondary | ICD-10-CM | POA: Diagnosis not present

## 2020-09-27 ENCOUNTER — Other Ambulatory Visit: Payer: Self-pay

## 2020-09-27 ENCOUNTER — Inpatient Hospital Stay (HOSPITAL_COMMUNITY): Payer: Medicare Other | Attending: Hematology

## 2020-09-27 DIAGNOSIS — D508 Other iron deficiency anemias: Secondary | ICD-10-CM

## 2020-09-27 DIAGNOSIS — D509 Iron deficiency anemia, unspecified: Secondary | ICD-10-CM | POA: Insufficient documentation

## 2020-09-27 LAB — CBC WITH DIFFERENTIAL/PLATELET
Abs Immature Granulocytes: 0.02 10*3/uL (ref 0.00–0.07)
Basophils Absolute: 0 10*3/uL (ref 0.0–0.1)
Basophils Relative: 1 %
Eosinophils Absolute: 0.2 10*3/uL (ref 0.0–0.5)
Eosinophils Relative: 3 %
HCT: 36.4 % (ref 36.0–46.0)
Hemoglobin: 11.3 g/dL — ABNORMAL LOW (ref 12.0–15.0)
Immature Granulocytes: 0 %
Lymphocytes Relative: 20 %
Lymphs Abs: 1.1 10*3/uL (ref 0.7–4.0)
MCH: 31.3 pg (ref 26.0–34.0)
MCHC: 31 g/dL (ref 30.0–36.0)
MCV: 100.8 fL — ABNORMAL HIGH (ref 80.0–100.0)
Monocytes Absolute: 0.4 10*3/uL (ref 0.1–1.0)
Monocytes Relative: 8 %
Neutro Abs: 4 10*3/uL (ref 1.7–7.7)
Neutrophils Relative %: 68 %
Platelets: 291 10*3/uL (ref 150–400)
RBC: 3.61 MIL/uL — ABNORMAL LOW (ref 3.87–5.11)
RDW: 14.7 % (ref 11.5–15.5)
WBC: 5.8 10*3/uL (ref 4.0–10.5)
nRBC: 0 % (ref 0.0–0.2)

## 2020-09-27 LAB — IRON AND TIBC
Iron: 59 ug/dL (ref 28–170)
Saturation Ratios: 15 % (ref 10.4–31.8)
TIBC: 387 ug/dL (ref 250–450)
UIBC: 328 ug/dL

## 2020-09-27 LAB — FERRITIN: Ferritin: 19 ng/mL (ref 11–307)

## 2020-09-30 NOTE — H&P (Signed)
Surgical History & Physical  Patient Name: Candice [name] DOB: 1951/09/20  Surgery: Cataract extraction with intraocular lens implant phacoemulsification; Right Eye  Surgeon: Baruch Goldmann MD Surgery Date:  10/15/2020 Pre-Op Date:  09/30/2020  HPI: A 53 Yr. old female patient Pt referred by Dr. Hassell Done for cataract evaluation. The patient complains of nighttime light - car headlights, street lamps etc. glare causing poor vision, which began about a year ago. Both eyes are affected. The episode is constant. The condition's severity is worsening. Distance vision is worse than near. Pt c/o increase in tearing and grittiness the past couple months. Pt using Systane prn OU. Symptoms are negatively affecting pt's quality of life. The patient experiences no increase in floaters. HPI was performed by Baruch Goldmann .  Medical History: Cataracts Macula Degeneration Anxiety, Depression, GERD Hypercholesterolemia Arthritis High Blood Pressure Lung Problems Thyroid Problems  Review of Systems Negative Allergic/Immunologic Negative Cardiovascular Negative Constitutional Negative Ear, Nose, Mouth & Throat Negative Endocrine Negative Eyes Negative Gastrointestinal Negative Genitourinary Negative Hemotologic/Lymphatic Negative Integumentary Negative Musculoskeletal Negative Neurological Negative Psychiatry Negative Respiratory  Social   Never smoked   Medication Systane Complete,  Albuterol, Aerosal inhaler, Aspirin, Citalopram, Gabapentin, Olmesartan, Oxycodone, Pantoprazole, Simvastatin,   Sx/Procedures Aneurysm blocked, Thyroid tumor,   Drug Allergies  Codeine, Sulfa,   History & Physical: Heent:  Cataract, Right eye NECK: supple without bruits LUNGS: lungs clear to auscultation CV: regular rate and rhythm Abdomen: soft and non-tender   Impression & Plan: Assessment: 1.  COMBINED FORMS AGE RELATED CATARACT; Both Eyes (H25.813) 2.  OAG BORDERLINE FINDINGS HIGH RISK; Both  Eyes (H40.023)  Plan: 1.  Cataract accounts for the patient's decreased vision. This visual impairment is not correctable with a tolerable change in glasses or contact lenses. Cataract surgery with an implantation of a new lens should significantly improve the visual and functional status of the patient. Discussed all risks, benefits, alternatives, and potential complications. Discussed the procedures and recovery. Patient desires to have surgery. A-scan ordered and performed today for intra-ocular lens calculations. The surgery will be performed in order to improve vision for driving, reading, and for eye examinations. Recommend phacoemulsification with intra-ocular lens. Recommend Dextenza for post-operative pain and inflammation. Right Eye worse - first. Dilates poorly - shugacaine by protocol. Omidira. 2.  Based on cup-to-disc ratio. Positive family history. IOP wnl OU today. Will need eval after cataract surgery - pachy, HVF 24-2, OCT rNFL. Detailed discussion about glaucoma today including importance of maintaining good follow up and following treatment plan, and the possibility of irreversible blindness as part of this disease process.

## 2020-10-04 ENCOUNTER — Inpatient Hospital Stay (HOSPITAL_BASED_OUTPATIENT_CLINIC_OR_DEPARTMENT_OTHER): Payer: Medicare Other | Admitting: Hematology

## 2020-10-04 ENCOUNTER — Other Ambulatory Visit: Payer: Self-pay

## 2020-10-04 DIAGNOSIS — D649 Anemia, unspecified: Secondary | ICD-10-CM | POA: Diagnosis not present

## 2020-10-04 NOTE — Progress Notes (Signed)
Virtual Visit via Telephone Note  I connected with Candice [name] on 10/04/20 at  2:45 PM EST by telephone and verified that I am speaking with the correct person using two identifiers.  Location: Patient: at home Provider: in office   I discussed the limitations, risks, security and privacy concerns of performing an evaluation and management service by telephone and the availability of in person appointments. I also discussed with the patient that there may be a patient responsible charge related to this service. The patient expressed understanding and agreed to proceed.   History of Present Illness: This patient is followed in our clinic for iron deficiency state resulting in anemia.  She also has CKD and likely chronic blood loss.  Colonoscopy was on 07/24/2017 with diverticulosis of the sigmoid colon external hemorrhoids.  Pipeline embolization of like aneurysm on 07/19/2019.  She was subsequently on dual antiplatelet therapy which she discontinued.   Observations/Objective: She reports that her energy levels are decreasing.  She denies any bleeding per rectum or melena.  Numbness in the feet has been stable.  Dizziness is also stable.  She is planning to have cataract surgery in December.  Assessment and Plan:  1.  Normocytic anemia: -Reviewed labs from 09/27/2020.  Hemoglobin is 11.3 and stable.  Ferritin decreased to 19 from 59. -Likely etiology is chronic bleeding from the small bowel, relative iron deficiency and CKD. -Last Feraheme was on 07/06/2020 and 07/13/2020. -Based on her low ferritin levels, I have recommended Feraheme x2. -RTC 3 months with labs.   Follow Up Instructions: RTC 3 months with labs.   I discussed the assessment and treatment plan with the patient. The patient was provided an opportunity to ask questions and all were answered. The patient agreed with the plan and demonstrated an understanding of the instructions.   The patient was advised to call back or  seek an in-person evaluation if the symptoms worsen or if the condition fails to improve as anticipated.  I provided 11 minutes of non-face-to-face time during this encounter.   Derek Jack, MD

## 2020-10-04 NOTE — Addendum Note (Signed)
Addended by: Estella Husk on: 10/04/2020 03:15 PM   Modules accepted: Orders

## 2020-10-05 ENCOUNTER — Encounter (HOSPITAL_COMMUNITY)
Admission: RE | Admit: 2020-10-05 | Discharge: 2020-10-05 | Disposition: A | Discharge status: Home / Self Care | Payer: Medicare Other | Source: Ambulatory Visit | Attending: Ophthalmology | Admitting: Ophthalmology

## 2020-10-06 ENCOUNTER — Inpatient Hospital Stay (HOSPITAL_COMMUNITY): Payer: Medicare Other

## 2020-10-06 ENCOUNTER — Other Ambulatory Visit: Payer: Self-pay

## 2020-10-06 VITALS — BP 107/59 | HR 69 | Temp 97.2°F | Resp 18

## 2020-10-06 DIAGNOSIS — D508 Other iron deficiency anemias: Secondary | ICD-10-CM

## 2020-10-06 DIAGNOSIS — D509 Iron deficiency anemia, unspecified: Secondary | ICD-10-CM | POA: Diagnosis not present

## 2020-10-06 DIAGNOSIS — D649 Anemia, unspecified: Secondary | ICD-10-CM

## 2020-10-06 MED ORDER — SODIUM CHLORIDE 0.9 % IV SOLN
510.0000 mg | Freq: Once | INTRAVENOUS | Status: AC
  Administered 2020-10-06: 510 mg via INTRAVENOUS
  Filled 2020-10-06: qty 17

## 2020-10-06 MED ORDER — SODIUM CHLORIDE 0.9 % IV SOLN: Freq: Once | INTRAVENOUS | Status: AC

## 2020-10-06 NOTE — Progress Notes (Signed)
Patient presents today doe Feraheme infusion.  Vital signs stable.  No new complaints since last visit.  Feraheme infusion given today per MD orders.  Tolerated infusion without adverse affects.  Vital signs stable.  No complaints at this time.  Discharge from clinic ambulatory in stable condition.  Alert and oriented X 3.  Follow up with [facility] as scheduled.

## 2020-10-11 DIAGNOSIS — H25811 Combined forms of age-related cataract, right eye: Secondary | ICD-10-CM | POA: Diagnosis not present

## 2020-10-12 DIAGNOSIS — E7849 Other hyperlipidemia: Secondary | ICD-10-CM | POA: Diagnosis not present

## 2020-10-12 DIAGNOSIS — I10 Essential (primary) hypertension: Secondary | ICD-10-CM | POA: Diagnosis not present

## 2020-10-12 DIAGNOSIS — D508 Other iron deficiency anemias: Secondary | ICD-10-CM | POA: Diagnosis not present

## 2020-10-13 ENCOUNTER — Other Ambulatory Visit (HOSPITAL_COMMUNITY)
Admission: RE | Admit: 2020-10-13 | Discharge: 2020-10-13 | Disposition: A | Discharge status: Home / Self Care | Payer: Medicare Other | Source: Ambulatory Visit | Attending: Ophthalmology | Admitting: Ophthalmology

## 2020-10-13 ENCOUNTER — Other Ambulatory Visit: Payer: Self-pay

## 2020-10-13 DIAGNOSIS — Z20822 Contact with and (suspected) exposure to covid-19: Secondary | ICD-10-CM | POA: Diagnosis not present

## 2020-10-13 DIAGNOSIS — Z01812 Encounter for preprocedural laboratory examination: Secondary | ICD-10-CM | POA: Insufficient documentation

## 2020-10-14 LAB — SARS CORONAVIRUS 2 (TAT 6-24 HRS): SARS Coronavirus 2: NEGATIVE

## 2020-10-15 ENCOUNTER — Encounter (HOSPITAL_COMMUNITY): Payer: Self-pay | Admitting: Ophthalmology

## 2020-10-15 ENCOUNTER — Ambulatory Visit (HOSPITAL_COMMUNITY)
Admission: RE | Admit: 2020-10-15 | Discharge: 2020-10-15 | Disposition: A | Discharge status: Home / Self Care | Payer: Medicare Other | Attending: Ophthalmology | Admitting: Ophthalmology

## 2020-10-15 ENCOUNTER — Ambulatory Visit (HOSPITAL_COMMUNITY): Payer: Medicare Other | Admitting: Certified Registered Nurse Anesthetist

## 2020-10-15 ENCOUNTER — Encounter (HOSPITAL_COMMUNITY)
Admission: RE | Disposition: A | Discharge status: Home / Self Care | Payer: Self-pay | Source: Home / Self Care | Attending: Ophthalmology

## 2020-10-15 DIAGNOSIS — H25813 Combined forms of age-related cataract, bilateral: Secondary | ICD-10-CM | POA: Insufficient documentation

## 2020-10-15 DIAGNOSIS — H40023 Open angle with borderline findings, high risk, bilateral: Secondary | ICD-10-CM | POA: Insufficient documentation

## 2020-10-15 DIAGNOSIS — Z7982 Long term (current) use of aspirin: Secondary | ICD-10-CM | POA: Insufficient documentation

## 2020-10-15 DIAGNOSIS — H25811 Combined forms of age-related cataract, right eye: Secondary | ICD-10-CM | POA: Diagnosis not present

## 2020-10-15 DIAGNOSIS — Z882 Allergy status to sulfonamides status: Secondary | ICD-10-CM | POA: Insufficient documentation

## 2020-10-15 DIAGNOSIS — Z79899 Other long term (current) drug therapy: Secondary | ICD-10-CM | POA: Diagnosis not present

## 2020-10-15 DIAGNOSIS — Z885 Allergy status to narcotic agent status: Secondary | ICD-10-CM | POA: Insufficient documentation

## 2020-10-15 HISTORY — PX: CATARACT EXTRACTION W/PHACO: SHX586

## 2020-10-15 SURGERY — PHACOEMULSIFICATION, CATARACT, WITH IOL INSERTION
Anesthesia: Monitor Anesthesia Care | Site: Eye | Laterality: Right

## 2020-10-15 MED ORDER — SODIUM HYALURONATE 23 MG/ML IO SOLN
INTRAOCULAR | Status: DC | PRN
  Administered 2020-10-15: 0.6 mL via INTRAOCULAR

## 2020-10-15 MED ORDER — LIDOCAINE HCL (PF) 1 % IJ SOLN
INTRAOCULAR | Status: DC | PRN
  Administered 2020-10-15: 1 mL via OPHTHALMIC

## 2020-10-15 MED ORDER — POVIDONE-IODINE 5 % OP SOLN
OPHTHALMIC | Status: DC | PRN
  Administered 2020-10-15: 1 via OPHTHALMIC

## 2020-10-15 MED ORDER — TETRACAINE HCL 0.5 % OP SOLN
1.0000 [drp] | OPHTHALMIC | Status: AC | PRN
  Administered 2020-10-15 (×3): 1 [drp] via OPHTHALMIC

## 2020-10-15 MED ORDER — MIDAZOLAM HCL 2 MG/2ML IJ SOLN
INTRAMUSCULAR | Status: AC
  Filled 2020-10-15: qty 2

## 2020-10-15 MED ORDER — LIDOCAINE HCL 3.5 % OP GEL
1.0000 "application " | Freq: Once | OPHTHALMIC | Status: AC
  Administered 2020-10-15: 1 via OPHTHALMIC

## 2020-10-15 MED ORDER — CYCLOPENTOLATE-PHENYLEPHRINE 0.2-1 % OP SOLN
1.0000 [drp] | OPHTHALMIC | Status: AC | PRN
  Administered 2020-10-15 (×3): 1 [drp] via OPHTHALMIC

## 2020-10-15 MED ORDER — BSS IO SOLN
INTRAOCULAR | Status: DC | PRN
  Administered 2020-10-15: 15 mL via INTRAOCULAR

## 2020-10-15 MED ORDER — PHENYLEPHRINE-KETOROLAC 1-0.3 % IO SOLN
INTRAOCULAR | Status: DC | PRN
  Administered 2020-10-15: 500 mL via OPHTHALMIC

## 2020-10-15 MED ORDER — PHENYLEPHRINE HCL 2.5 % OP SOLN
1.0000 [drp] | OPHTHALMIC | Status: AC | PRN
  Administered 2020-10-15 (×3): 1 [drp] via OPHTHALMIC

## 2020-10-15 MED ORDER — PHENYLEPHRINE-KETOROLAC 1-0.3 % IO SOLN
INTRAOCULAR | Status: AC
  Filled 2020-10-15: qty 4

## 2020-10-15 MED ORDER — DEXAMETHASONE 0.4 MG OP INST
VAGINAL_INSERT | OPHTHALMIC | Status: AC
  Filled 2020-10-15: qty 1

## 2020-10-15 MED ORDER — MIDAZOLAM HCL 5 MG/5ML IJ SOLN
INTRAMUSCULAR | Status: DC | PRN
  Administered 2020-10-15 (×2): 1 mg via INTRAVENOUS

## 2020-10-15 MED ORDER — STERILE WATER FOR IRRIGATION IR SOLN
Status: DC | PRN
  Administered 2020-10-15: 250 mL

## 2020-10-15 MED ORDER — PROVISC 10 MG/ML IO SOLN
INTRAOCULAR | Status: DC | PRN
  Administered 2020-10-15 (×2): 0.85 mL via INTRAOCULAR

## 2020-10-15 MED ORDER — EPINEPHRINE PF 1 MG/ML IJ SOLN
INTRAMUSCULAR | Status: AC
  Filled 2020-10-15: qty 1

## 2020-10-15 SURGICAL SUPPLY — 14 items
CLOTH BEACON ORANGE TIMEOUT ST (SAFETY) ×3 IMPLANT
EYE SHIELD UNIVERSAL CLEAR (GAUZE/BANDAGES/DRESSINGS) ×3 IMPLANT
GLOVE BIOGEL PI IND STRL 7.0 (GLOVE) ×2 IMPLANT
GLOVE BIOGEL PI INDICATOR 7.0 (GLOVE) ×4
GOWN STRL REUS W/ TWL XL LVL3 (GOWN DISPOSABLE) ×1 IMPLANT
GOWN STRL REUS W/TWL XL LVL3 (GOWN DISPOSABLE) ×3
NEEDLE HYPO 18GX1.5 BLUNT FILL (NEEDLE) ×3 IMPLANT
PAD ARMBOARD 7.5X6 YLW CONV (MISCELLANEOUS) ×3 IMPLANT
SYR TB 1ML LL NO SAFETY (SYRINGE) ×3 IMPLANT
TAPE SURG TRANSPORE 1 IN (GAUZE/BANDAGES/DRESSINGS) ×1 IMPLANT
TAPE SURGICAL TRANSPORE 1 IN (GAUZE/BANDAGES/DRESSINGS) ×3
TECNIS IOL (Intraocular Lens) ×3 IMPLANT
VISCOELASTIC ADDITIONAL (OPHTHALMIC RELATED) IMPLANT
WATER STERILE IRR 250ML POUR (IV SOLUTION) ×3 IMPLANT

## 2020-10-15 NOTE — Anesthesia Postprocedure Evaluation (Signed)
Anesthesia Post Note  Patient: Candice [name]  Procedure(s) Performed: CATARACT EXTRACTION PHACO AND INTRAOCULAR LENS PLACEMENT (IOC) (Right Eye)  Patient location during evaluation: Short Stay Anesthesia Type: MAC Level of consciousness: awake and alert Pain management: pain level controlled Vital Signs Assessment: post-procedure vital signs reviewed and stable Respiratory status: spontaneous breathing Cardiovascular status: blood pressure returned to baseline Postop Assessment: no apparent nausea or vomiting Anesthetic complications: no   No complications documented.   Last Vitals:  Vitals:   10/15/20 0708  Pulse: 67  Resp: 16  Temp: 36.8 C  SpO2: 97%    Last Pain:  Vitals:   10/15/20 0708  TempSrc: Oral  PainSc: 0-No pain                 Yazlyn Wentzel

## 2020-10-15 NOTE — Anesthesia Preprocedure Evaluation (Signed)
Anesthesia Evaluation  Patient identified by MRN, date of birth, ID band Patient awake    Reviewed: Allergy & Precautions, H&P , NPO status , Patient's Chart, lab work & pertinent test results, reviewed documented beta blocker date and time   Airway Mallampati: II  TM Distance: >3 FB Neck ROM: full    Dental no notable dental hx.    Pulmonary shortness of breath, asthma , former smoker,    Pulmonary exam normal breath sounds clear to auscultation       Cardiovascular Exercise Tolerance: Good hypertension, negative cardio ROS   Rhythm:regular Rate:Normal     Neuro/Psych PSYCHIATRIC DISORDERS Anxiety Depression  Neuromuscular disease    GI/Hepatic Neg liver ROS, GERD  Medicated,  Endo/Other  negative endocrine ROS  Renal/GU negative Renal ROS  negative genitourinary   Musculoskeletal   Abdominal   Peds  Hematology  (+) Blood dyscrasia, anemia ,   Anesthesia Other Findings   Reproductive/Obstetrics negative OB ROS                             Anesthesia Physical Anesthesia Plan  ASA: II  Anesthesia Plan: MAC   Post-op Pain Management:    Induction:   PONV Risk Score and Plan:   Airway Management Planned:   Additional Equipment:   Intra-op Plan:   Post-operative Plan:   Informed Consent: I have reviewed the patients History and Physical, chart, labs and discussed the procedure including the risks, benefits and alternatives for the proposed anesthesia with the patient or authorized representative who has indicated his/her understanding and acceptance.     Dental Advisory Given  Plan Discussed with: CRNA  Anesthesia Plan Comments:         Anesthesia Quick Evaluation

## 2020-10-15 NOTE — Transfer of Care (Signed)
Immediate Anesthesia Transfer of Care Note  Patient: Candice [name]  Procedure(s) Performed: CATARACT EXTRACTION PHACO AND INTRAOCULAR LENS PLACEMENT (IOC) (Right Eye)  Patient Location: Short Stay  Anesthesia Type:MAC  Level of Consciousness: awake  Airway & Oxygen Therapy: Patient Spontanous Breathing  Post-op Assessment: Report given to RN  Post vital signs: Reviewed  Last Vitals:  Vitals Value Taken Time  BP    Temp    Pulse    Resp    SpO2      Last Pain:  Vitals:   10/15/20 0708  TempSrc: Oral  PainSc: 0-No pain      Patients Stated Pain Goal: 5 (63/01/60 1093)  Complications: No complications documented.

## 2020-10-15 NOTE — Op Note (Signed)
Date of procedure: 10/15/20  Pre-operative diagnosis:  Visually significant combined form age-related cataract, Right Eye (H25.811)  Post-operative diagnosis:  Visually significant combined form age-related cataract, Right Eye (H25.811)  Procedure: Removal of cataract via phacoemulsification and insertion of intra-ocular lens Wynetta Emery and Johnson Vision [id number]  +19.0D into the capsular bag of the Right Eye  Attending surgeon: Gerda Diss. Titania Gault, MD, MA  Anesthesia: MAC, Topical Akten  Complications: None  Estimated Blood Loss: <51m (minimal)  Specimens: None  Implants: As above  Indications:  Visually significant age-related cataract, Right Eye  Procedure:  The patient was seen and identified in the pre-operative area. The operative eye was identified and dilated.  The operative eye was marked.  Topical anesthesia was administered to the operative eye.     The patient was then to the operative suite and placed in the supine position.  A timeout was performed confirming the patient, procedure to be performed, and all other relevant information.   The patient's face was prepped and draped in the usual fashion for intra-ocular surgery.  A lid speculum was placed into the operative eye and the surgical microscope moved into place and focused.  A superotemporal paracentesis was created using a 20 gauge paracentesis blade.  Shugarcaine was injected into the anterior chamber.  Viscoelastic was injected into the anterior chamber.  A temporal clear-corneal main wound incision was created using a 2.435mmicrokeratome.  A continuous curvilinear capsulorrhexis was initiated using an irrigating cystitome and completed using capsulorrhexis forceps.  Hydrodissection and hydrodeliniation were performed.  Viscoelastic was injected into the anterior chamber.  A phacoemulsification handpiece and a chopper as a second instrument were used to remove the nucleus and epinucleus. The irrigation/aspiration handpiece was  used to remove any remaining cortical material.   The capsular bag was reinflated with viscoelastic, checked, and found to be intact.  The intraocular lens was inserted into the capsular bag.  The irrigation/aspiration handpiece was used to remove any remaining viscoelastic.  The clear corneal wound and paracentesis wounds were then hydrated and checked with Weck-Cels to be watertight.  The lid-speculum was removed.  The drape was removed.  The patient's face was cleaned with a wet and dry 4x4. A clear shield was taped over the eye. The patient was taken to the post-operative care unit in good condition, having tolerated the procedure well.  Post-Op Instructions: [facility] and will receive all other orders and instructions.

## 2020-10-15 NOTE — Interval H&P Note (Signed)
History and Physical Interval Note:  10/15/2020 8:30 AM  [name] Candice [name]  has presented today for surgery, with the diagnosis of Nuclear sclerotic cataract - Right eye.  The various methods of treatment have been discussed with the patient and family. After consideration of risks, benefits and other options for treatment, the patient has consented to  Procedure(s) with comments: CATARACT EXTRACTION PHACO AND INTRAOCULAR LENS PLACEMENT (IOC) (Right) - right as a surgical intervention.  The patient's history has been reviewed, patient examined, no change in status, stable for surgery.  I have reviewed the patient's chart and labs.  Questions were answered to the patient's satisfaction.     Baruch Goldmann

## 2020-10-15 NOTE — Discharge Instructions (Signed)
Please discharge patient when stable, will follow up today with Dr. Riyah Bardon at the [facility] office immediately following discharge.  Leave shield in place until visit.  All paperwork with discharge instructions will be given at the office.  [facility] Address:  [address]  , [place] [address]             Monitored Anesthesia Care, Care After These instructions provide you with information about caring for yourself after your procedure. Your health care provider may also give you more specific instructions. Your treatment has been planned according to current medical practices, but problems sometimes occur. Call your health care provider if you have any problems or questions after your procedure. What can I expect after the procedure? After your procedure, you may:  Feel sleepy for several hours.  Feel clumsy and have poor balance for several hours.  Feel forgetful about what happened after the procedure.  Have poor judgment for several hours.  Feel nauseous or vomit.  Have a sore throat if you had a breathing tube during the procedure. Follow these instructions at home: For at least 24 hours after the procedure:      Have a responsible adult stay with you. It is important to have someone help care for you until you are awake and alert.  Rest as needed.  Do not: ? Participate in activities in which you could fall or become injured. ? Drive. ? Use heavy machinery. ? Drink alcohol. ? Take sleeping pills or medicines that cause drowsiness. ? Make important decisions or sign legal documents. ? Take care of children on your own. Eating and drinking  Follow the diet that is recommended by your health care provider.  If you vomit, drink water, juice, or soup when you can drink without vomiting.  Make sure you have little or no nausea before eating solid foods. General instructions  Take over-the-counter and  prescription medicines only as told by your health care provider.  If you have sleep apnea, surgery and certain medicines can increase your risk for breathing problems. Follow instructions from your health care provider about wearing your sleep device: ? Anytime you are sleeping, including during daytime naps. ? While taking prescription pain medicines, sleeping medicines, or medicines that make you drowsy.  If you smoke, do not smoke without supervision.  Keep all follow-up visits as told by your health care provider. This is important. Contact a health care provider if:  You keep feeling nauseous or you keep vomiting.  You feel light-headed.  You develop a rash.  You have a fever. Get help right away if:  You have trouble breathing. Summary  For several hours after your procedure, you may feel sleepy and have poor judgment.  Have a responsible adult stay with you for at least 24 hours or until you are awake and alert. This information is not intended to replace advice given to you by your health care provider. Make sure you discuss any questions you have with your health care provider. Document Revised: 01/28/2018 Document Reviewed: 02/20/2016 Elsevier Patient Education  2020 Elsevier Inc.  

## 2020-10-18 ENCOUNTER — Encounter (HOSPITAL_COMMUNITY): Payer: Self-pay | Admitting: Ophthalmology

## 2020-10-19 ENCOUNTER — Inpatient Hospital Stay (HOSPITAL_COMMUNITY): Payer: Medicare Other | Attending: Hematology

## 2020-10-19 ENCOUNTER — Other Ambulatory Visit: Payer: Self-pay

## 2020-10-19 VITALS — BP 118/67 | HR 69 | Temp 97.2°F | Resp 18

## 2020-10-19 DIAGNOSIS — D649 Anemia, unspecified: Secondary | ICD-10-CM

## 2020-10-19 DIAGNOSIS — D509 Iron deficiency anemia, unspecified: Secondary | ICD-10-CM | POA: Diagnosis not present

## 2020-10-19 DIAGNOSIS — D508 Other iron deficiency anemias: Secondary | ICD-10-CM

## 2020-10-19 MED ORDER — SODIUM CHLORIDE 0.9 % IV SOLN: Freq: Once | INTRAVENOUS | Status: AC

## 2020-10-19 MED ORDER — SODIUM CHLORIDE 0.9 % IV SOLN
510.0000 mg | Freq: Once | INTRAVENOUS | Status: AC
  Administered 2020-10-19: 510 mg via INTRAVENOUS
  Filled 2020-10-19: qty 510

## 2020-10-19 NOTE — Patient Instructions (Signed)
[facility] Discharge Instructions for Patients Receiving Feraheme Infusion  Today you received the following Feraheme Infusion  To help prevent nausea and vomiting after your treatment, we encourage you to take your nausea medication    If you develop nausea and vomiting that is not controlled by your nausea medication, call the clinic.   BELOW ARE SYMPTOMS THAT SHOULD BE REPORTED IMMEDIATELY:  *FEVER GREATER THAN 100.5 F  *CHILLS WITH OR WITHOUT FEVER  NAUSEA AND VOMITING THAT IS NOT CONTROLLED WITH YOUR NAUSEA MEDICATION  *UNUSUAL SHORTNESS OF BREATH  *UNUSUAL BRUISING OR BLEEDING  TENDERNESS IN MOUTH AND THROAT WITH OR WITHOUT PRESENCE OF ULCERS  *URINARY PROBLEMS  *BOWEL PROBLEMS  UNUSUAL RASH Items with * indicate a potential emergency and should be followed up as soon as possible.  Feel free to call the clinic should you have any questions or concerns. The clinic phone number is [phone].  Please show the [facility] at check-in to the Emergency Department and triage nurse.

## 2020-10-19 NOTE — Progress Notes (Signed)
Patient presents today for Feraheme infusion.  Vital signs stable.  No new complaints since last visit.  1500 Feraheme infusion given today per MD orders.  Tolerated infusion without adverse affects.  Vital signs stable.  No complaints at this time.  Discharge from clinic ambulatory in stable condition.  Alert and oriented X 3.  Follow up with [facility] as scheduled.

## 2020-10-22 ENCOUNTER — Other Ambulatory Visit: Payer: Self-pay

## 2020-10-22 ENCOUNTER — Encounter (HOSPITAL_COMMUNITY)
Admission: RE | Admit: 2020-10-22 | Discharge: 2020-10-22 | Disposition: A | Discharge status: Home / Self Care | Payer: Medicare Other | Source: Ambulatory Visit | Attending: Ophthalmology | Admitting: Ophthalmology

## 2020-10-22 NOTE — H&P (Signed)
Surgical History & Physical  Patient Name: Candice [name] DOB: 05/08/1951  Surgery: Cataract extraction with intraocular lens implant phacoemulsification; Left Eye  Surgeon: Baruch Goldmann MD Surgery Date:  10/29/2020 Pre-Op Date:  10/21/2020  HPI: A 27 Yr. old female patient The patient is returning after cataract surgery. The right eye is affected. Status post cataract surgery, which began 1 weeks ago: Since the last visit, the affected area feels improvement and is doing well. Pt states the first two days after sx she has some light sensitivity and pressure. All symptoms resolved as of Monday. Pt misplaced her eye shield on Monday and ha been unable to wear since. The patient's vision is stable and improved. Patient is following medication instructions. Omni BID OD. The patient experiences no increase floaters. The patient complains of nighttime light - car headlights, street lamps etc. glare causing poor vision, which began about a year ago. The left eye is affected. The episode is constant. The condition's severity is worsening. Distance vision is worse than near Symptoms are negatively affecting pt's quality of life. HPI Completed by Dr. Baruch Goldmann  Medical History: Cataracts Anxiety, Depression, GERD Hypercholesterolemia Arthritis High Blood Pressure Lung Problems Thyroid Problems  Review of Systems Negative Allergic/Immunologic Negative Cardiovascular Negative Constitutional Negative Ear, Nose, Mouth & Throat Negative Endocrine Negative Eyes Negative Gastrointestinal Negative Genitourinary Negative Hemotologic/Lymphatic Negative Integumentary Negative Musculoskeletal Negative Neurological Negative Psychiatry Negative Respiratory  Social   Never smoked   Medication Systane Complete, Prednisolone-gatiflox-bromfenac,  Albuterol, Aerosal inhaler, Aspirin, Citalopram, Gabapentin, Olmesartan, Oxycodone, Pantoprazole, Simvastatin,   Sx/Procedures Phaco c IOL OD,   Aneurysm blocked, Thyroid tumor,  Drug Allergies  Codeine, Sulfa,   History & Physical: Heent:  Cataract, Left eye NECK: supple without bruits LUNGS: lungs clear to auscultation CV: regular rate and rhythm Abdomen: soft and non-tender  Impression & Plan: Assessment: 1.  CATARACT EXTRACTION STATUS; Right Eye (Z98.41) 2.  COMBINED FORMS AGE RELATED CATARACT; Left Eye (H25.812)  Plan: 1.  1 week after cataract surgery. Doing well with improved vision and normal eye pressure. Call with any problems or concerns. Start Maxitrol drops 1 drop 2x/day right eye.. 2.  Cataract accounts for the patient's decreased vision. This visual impairment is not correctable with a tolerable change in glasses or contact lenses. Cataract surgery with an implantation of a new lens should significantly improve the visual and functional status of the patient. Discussed all risks, benefits, alternatives, and potential complications. Discussed the procedures and recovery. Patient desires to have surgery. A-scan ordered and performed today for intra-ocular lens calculations. The surgery will be performed in order to improve vision for driving, reading, and for eye examinations. Recommend phacoemulsification with intra-ocular lens. Recommend Dextenza for post-operative pain and inflammation. Left Eye. Surgery required to correct imbalance of vision. Dilates well - shugarcaine by protocol.

## 2020-10-25 DIAGNOSIS — H25812 Combined forms of age-related cataract, left eye: Secondary | ICD-10-CM | POA: Diagnosis not present

## 2020-10-27 ENCOUNTER — Other Ambulatory Visit: Payer: Self-pay

## 2020-10-27 ENCOUNTER — Other Ambulatory Visit (HOSPITAL_COMMUNITY)
Admission: RE | Admit: 2020-10-27 | Discharge: 2020-10-27 | Disposition: A | Discharge status: Home / Self Care | Payer: Medicare Other | Source: Ambulatory Visit | Attending: Ophthalmology | Admitting: Ophthalmology

## 2020-10-27 DIAGNOSIS — Z20822 Contact with and (suspected) exposure to covid-19: Secondary | ICD-10-CM | POA: Insufficient documentation

## 2020-10-27 DIAGNOSIS — Z01812 Encounter for preprocedural laboratory examination: Secondary | ICD-10-CM | POA: Diagnosis not present

## 2020-10-27 LAB — SARS CORONAVIRUS 2 (TAT 6-24 HRS): SARS Coronavirus 2: NEGATIVE

## 2020-10-29 ENCOUNTER — Ambulatory Visit (HOSPITAL_COMMUNITY): Payer: Medicare Other | Admitting: Anesthesiology

## 2020-10-29 ENCOUNTER — Encounter (HOSPITAL_COMMUNITY)
Admission: RE | Disposition: A | Discharge status: Home / Self Care | Payer: Self-pay | Source: Home / Self Care | Attending: Ophthalmology

## 2020-10-29 ENCOUNTER — Ambulatory Visit (HOSPITAL_COMMUNITY)
Admission: RE | Admit: 2020-10-29 | Discharge: 2020-10-29 | Disposition: A | Discharge status: Home / Self Care | Payer: Medicare Other | Attending: Ophthalmology | Admitting: Ophthalmology

## 2020-10-29 DIAGNOSIS — Z885 Allergy status to narcotic agent status: Secondary | ICD-10-CM | POA: Insufficient documentation

## 2020-10-29 DIAGNOSIS — H25812 Combined forms of age-related cataract, left eye: Secondary | ICD-10-CM | POA: Diagnosis not present

## 2020-10-29 DIAGNOSIS — Z9841 Cataract extraction status, right eye: Secondary | ICD-10-CM | POA: Insufficient documentation

## 2020-10-29 DIAGNOSIS — H5712 Ocular pain, left eye: Secondary | ICD-10-CM | POA: Diagnosis not present

## 2020-10-29 DIAGNOSIS — Z882 Allergy status to sulfonamides status: Secondary | ICD-10-CM | POA: Diagnosis not present

## 2020-10-29 SURGERY — CATARACT EXTRACTION PHACO AND INTRAOCULAR LENS PLACEMENT (IOC) with placement of Corticosteroid
Anesthesia: Monitor Anesthesia Care | Site: Eye | Laterality: Left

## 2020-10-29 MED ORDER — TETRACAINE HCL 0.5 % OP SOLN
1.0000 [drp] | OPHTHALMIC | Status: AC | PRN
  Administered 2020-10-29 (×3): 1 [drp] via OPHTHALMIC

## 2020-10-29 MED ORDER — EPINEPHRINE PF 1 MG/ML IJ SOLN
INTRAMUSCULAR | Status: AC
  Filled 2020-10-29: qty 1

## 2020-10-29 MED ORDER — SODIUM HYALURONATE 23 MG/ML IO SOLN
INTRAOCULAR | Status: DC | PRN
  Administered 2020-10-29: 0.6 mL via INTRAOCULAR

## 2020-10-29 MED ORDER — EPINEPHRINE PF 1 MG/ML IJ SOLN: INTRAOCULAR | Status: DC | PRN

## 2020-10-29 MED ORDER — DEXAMETHASONE 0.4 MG OP INST
VAGINAL_INSERT | OPHTHALMIC | Status: AC
  Filled 2020-10-29: qty 1

## 2020-10-29 MED ORDER — SODIUM CHLORIDE 0.9% FLUSH
INTRAVENOUS | Status: DC | PRN
  Administered 2020-10-29: 3 mL via INTRAVENOUS

## 2020-10-29 MED ORDER — LACTATED RINGERS IV SOLN: INTRAVENOUS | Status: DC

## 2020-10-29 MED ORDER — LIDOCAINE HCL (PF) 1 % IJ SOLN
INTRAOCULAR | Status: DC | PRN
  Administered 2020-10-29: 13:00:00 1 mL via OPHTHALMIC

## 2020-10-29 MED ORDER — PHENYLEPHRINE-KETOROLAC 1-0.3 % IO SOLN
INTRAOCULAR | Status: DC | PRN
  Administered 2020-10-29: 13:00:00 500 mL via OPHTHALMIC

## 2020-10-29 MED ORDER — TROPICAMIDE 1 % OP SOLN
1.0000 [drp] | OPHTHALMIC | Status: AC
  Administered 2020-10-29 (×2): 1 [drp] via OPHTHALMIC

## 2020-10-29 MED ORDER — MIDAZOLAM HCL 2 MG/2ML IJ SOLN
INTRAMUSCULAR | Status: DC | PRN
  Administered 2020-10-29: 2 mg via INTRAVENOUS

## 2020-10-29 MED ORDER — LIDOCAINE HCL 3.5 % OP GEL
1.0000 "application " | Freq: Once | OPHTHALMIC | Status: AC
  Administered 2020-10-29: 1 via OPHTHALMIC

## 2020-10-29 MED ORDER — BSS IO SOLN
INTRAOCULAR | Status: DC | PRN
  Administered 2020-10-29: 15 mL via INTRAOCULAR

## 2020-10-29 MED ORDER — MIDAZOLAM HCL 2 MG/2ML IJ SOLN
INTRAMUSCULAR | Status: AC
  Filled 2020-10-29: qty 2

## 2020-10-29 MED ORDER — STERILE WATER FOR IRRIGATION IR SOLN
Status: DC | PRN
  Administered 2020-10-29: 250 mL

## 2020-10-29 MED ORDER — DEXAMETHASONE 0.4 MG OP INST
VAGINAL_INSERT | OPHTHALMIC | Status: DC | PRN
  Administered 2020-10-29: 0.4 mg via OPHTHALMIC

## 2020-10-29 MED ORDER — PHENYLEPHRINE HCL 2.5 % OP SOLN
1.0000 [drp] | OPHTHALMIC | Status: AC | PRN
  Administered 2020-10-29 (×3): 1 [drp] via OPHTHALMIC

## 2020-10-29 MED ORDER — PHENYLEPHRINE-KETOROLAC 1-0.3 % IO SOLN
INTRAOCULAR | Status: AC
  Filled 2020-10-29: qty 4

## 2020-10-29 MED ORDER — POVIDONE-IODINE 5 % OP SOLN
OPHTHALMIC | Status: DC | PRN
  Administered 2020-10-29: 1 via OPHTHALMIC

## 2020-10-29 MED ORDER — PROVISC 10 MG/ML IO SOLN
INTRAOCULAR | Status: DC | PRN
  Administered 2020-10-29: 0.85 mL via INTRAOCULAR

## 2020-10-29 SURGICAL SUPPLY — 16 items
CLOTH BEACON ORANGE TIMEOUT ST (SAFETY) ×4 IMPLANT
DEVICE MILOOP (MISCELLANEOUS) IMPLANT
EYE SHIELD UNIVERSAL CLEAR (GAUZE/BANDAGES/DRESSINGS) ×4 IMPLANT
GLOVE BIOGEL PI IND STRL 7.0 (GLOVE) ×6 IMPLANT
GLOVE BIOGEL PI INDICATOR 7.0 (GLOVE) ×6
MILOOP DEVICE (MISCELLANEOUS)
NEEDLE HYPO 18GX1.5 BLUNT FILL (NEEDLE) ×4 IMPLANT
PAD ARMBOARD 7.5X6 YLW CONV (MISCELLANEOUS) ×4 IMPLANT
RING MALYGIN (MISCELLANEOUS) IMPLANT
RING MALYGIN 7.0 (MISCELLANEOUS) IMPLANT
SYR TB 1ML LL NO SAFETY (SYRINGE) ×4 IMPLANT
TAPE SURG TRANSPORE 1 IN (GAUZE/BANDAGES/DRESSINGS) ×2 IMPLANT
TAPE SURGICAL TRANSPORE 1 IN (GAUZE/BANDAGES/DRESSINGS) ×4
TECNIS 1 PIECE INTRAOCULAR LENS (Intraocular Lens) ×4 IMPLANT
VISCOELASTIC ADDITIONAL (OPHTHALMIC RELATED) IMPLANT
WATER STERILE IRR 250ML POUR (IV SOLUTION) ×4 IMPLANT

## 2020-10-29 NOTE — Anesthesia Preprocedure Evaluation (Signed)
Anesthesia Evaluation  Patient identified by MRN, date of birth, ID band Patient awake    Reviewed: Allergy & Precautions, H&P , NPO status , Patient's Chart, lab work & pertinent test results, reviewed documented beta blocker date and time   Airway Mallampati: II  TM Distance: >3 FB Neck ROM: full    Dental no notable dental hx.    Pulmonary shortness of breath, asthma , former smoker,    Pulmonary exam normal breath sounds clear to auscultation       Cardiovascular Exercise Tolerance: Good hypertension, negative cardio ROS   Rhythm:regular Rate:Normal     Neuro/Psych PSYCHIATRIC DISORDERS Anxiety Depression  Neuromuscular disease    GI/Hepatic Neg liver ROS, GERD  Medicated,  Endo/Other  negative endocrine ROS  Renal/GU negative Renal ROS  negative genitourinary   Musculoskeletal   Abdominal   Peds  Hematology  (+) Blood dyscrasia, anemia ,   Anesthesia Other Findings   Reproductive/Obstetrics negative OB ROS                             Anesthesia Physical  Anesthesia Plan  ASA: II  Anesthesia Plan: MAC   Post-op Pain Management:    Induction:   PONV Risk Score and Plan:   Airway Management Planned:   Additional Equipment:   Intra-op Plan:   Post-operative Plan:   Informed Consent: I have reviewed the patients History and Physical, chart, labs and discussed the procedure including the risks, benefits and alternatives for the proposed anesthesia with the patient or authorized representative who has indicated his/her understanding and acceptance.     Dental Advisory Given  Plan Discussed with: CRNA  Anesthesia Plan Comments:         Anesthesia Quick Evaluation

## 2020-10-29 NOTE — Interval H&P Note (Signed)
History and Physical Interval Note:  10/29/2020 12:17 PM  Candice [name]  has presented today for surgery, with the diagnosis of Nuclear sclerotic cataract - Left eye.  The various methods of treatment have been discussed with the patient and family. After consideration of risks, benefits and other options for treatment, the patient has consented to  Procedure(s) with comments: CATARACT EXTRACTION PHACO AND INTRAOCULAR LENS PLACEMENT ([place]) (Left) - CDE  as a surgical intervention.  The patient's history has been reviewed, patient examined, no change in status, stable for surgery.  I have reviewed the patient's chart and labs.  Questions were answered to the patient's satisfaction.     Baruch Goldmann

## 2020-10-29 NOTE — Anesthesia Postprocedure Evaluation (Signed)
Anesthesia Post Note  Patient: Candice [name]  Procedure(s) Performed: CATARACT EXTRACTION PHACO AND INTRAOCULAR LENS PLACEMENT LEFT EYE (Left Eye)  Patient location during evaluation: Phase II Anesthesia Type: MAC Level of consciousness: awake, oriented, awake and alert and patient cooperative Pain management: pain level controlled Vital Signs Assessment: post-procedure vital signs reviewed and stable Respiratory status: spontaneous breathing, respiratory function stable and nonlabored ventilation Postop Assessment: no apparent nausea or vomiting Anesthetic complications: no   No complications documented.   Last Vitals:  Vitals:   10/29/20 1149  BP: (!) (P) 120/57  Pulse: (P) 62  Resp: (P) 18  Temp: (P) 36.7 C  SpO2: (P) 98%    Last Pain:  Vitals:   10/29/20 1149  TempSrc: (P) Oral  PainSc: (P) 0-No pain                 Chelsea Nusz

## 2020-10-29 NOTE — Op Note (Signed)
Date of procedure: 10/29/20  Pre-operative diagnosis: Visually significant age-related combined cataract, Left Eye (H25.812)  Post-operative diagnosis:  1. Visually significant age-related combined cataract, Left Eye (H25.812) 2.   Pain and inflammation following cataract surgery, Left Eye (H57.12)  Procedure:  1. Removal of cataract via phacoemulsification and insertion of intra-ocular lens Johnson and Adrian  +19.0D into the capsular bag of the Left Eye 2. Placement of Dextenza Implant, Left Lower Lid  Attending surgeon: Gerda Diss. Genella Bas, MD, MA  Anesthesia: MAC, Topical Akten  Complications: None  Estimated Blood Loss: <28m (minimal)  Specimens: None  Implants: As above  Indications:  Visually significant age-related cataract, Left Eye  Procedure:  The patient was seen and identified in the pre-operative area. The operative eye was identified and dilated.  The operative eye was marked.  Topical anesthesia was administered to the operative eye.     The patient was then to the operative suite and placed in the supine position.  A timeout was performed confirming the patient, procedure to be performed, and all other relevant information.   The patient's face was prepped and draped in the usual fashion for intra-ocular surgery.  A lid speculum was placed into the operative eye and the surgical microscope moved into place and focused.  An inferotemporal paracentesis was created using a 20 gauge paracentesis blade.  Shugarcaine was injected into the anterior chamber.  Viscoelastic was injected into the anterior chamber.  A temporal clear-corneal main wound incision was created using a 2.464mmicrokeratome.  A continuous curvilinear capsulorrhexis was initiated using an irrigating cystitome and completed using capsulorrhexis forceps.  Hydrodissection and hydrodeliniation were performed.  Viscoelastic was injected into the anterior chamber.  A phacoemulsification handpiece and a  chopper as a second instrument were used to remove the nucleus and epinucleus. The irrigation/aspiration handpiece was used to remove any remaining cortical material.   The capsular bag was reinflated with viscoelastic, checked, and found to be intact.  The intraocular lens was inserted into the capsular bag.  The irrigation/aspiration handpiece was used to remove any remaining viscoelastic.  The clear corneal wound and paracentesis wounds were then hydrated and checked with Weck-Cels to be watertight.  The lid-speculum was removed.    A Dextenza implant was placed in the lower canaliculus.   The drape was removed.  The patient's face was cleaned with a wet and dry 4x4.   A clear shield was taped over the eye. The patient was taken to the post-operative care unit in good condition, having tolerated the procedure well.  Post-Op Instructions: [facility] and will receive all other orders and instructions.

## 2020-10-29 NOTE — Discharge Instructions (Signed)
Please discharge patient when stable, will follow up today with Dr. Rithvik Orcutt at the [facility] office immediately following discharge.  Leave shield in place until visit.  All paperwork with discharge instructions will be given at the office.  [facility] Address:  [address]  [place], [place] [address]             Monitored Anesthesia Care, Care After These instructions provide you with information about caring for yourself after your procedure. Your health care provider may also give you more specific instructions. Your treatment has been planned according to current medical practices, but problems sometimes occur. Call your health care provider if you have any problems or questions after your procedure. What can I expect after the procedure? After your procedure, you may:  Feel sleepy for several hours.  Feel clumsy and have poor balance for several hours.  Feel forgetful about what happened after the procedure.  Have poor judgment for several hours.  Feel nauseous or vomit.  Have a sore throat if you had a breathing tube during the procedure. Follow these instructions at home: For at least 24 hours after the procedure:      Have a responsible adult stay with you. It is important to have someone help care for you until you are awake and alert.  Rest as needed.  Do not: ? Participate in activities in which you could fall or become injured. ? Drive. ? Use heavy machinery. ? Drink alcohol. ? Take sleeping pills or medicines that cause drowsiness. ? Make important decisions or sign legal documents. ? Take care of children on your own. Eating and drinking  Follow the diet that is recommended by your health care provider.  If you vomit, drink water, juice, or soup when you can drink without vomiting.  Make sure you have little or no nausea before eating solid foods. General instructions  Take over-the-counter and  prescription medicines only as told by your health care provider.  If you have sleep apnea, surgery and certain medicines can increase your risk for breathing problems. Follow instructions from your health care provider about wearing your sleep device: ? Anytime you are sleeping, including during daytime naps. ? While taking prescription pain medicines, sleeping medicines, or medicines that make you drowsy.  If you smoke, do not smoke without supervision.  Keep all follow-up visits as told by your health care provider. This is important. Contact a health care provider if:  You keep feeling nauseous or you keep vomiting.  You feel light-headed.  You develop a rash.  You have a fever. Get help right away if:  You have trouble breathing. Summary  For several hours after your procedure, you may feel sleepy and have poor judgment.  Have a responsible adult stay with you for at least 24 hours or until you are awake and alert. This information is not intended to replace advice given to you by your health care provider. Make sure you discuss any questions you have with your health care provider. Document Revised: 01/28/2018 Document Reviewed: 02/20/2016 Elsevier Patient Education  2020 Elsevier Inc.  

## 2020-10-29 NOTE — Transfer of Care (Signed)
Immediate Anesthesia Transfer of Care Note  Patient: Candice [name]  Procedure(s) Performed: CATARACT EXTRACTION PHACO AND INTRAOCULAR LENS PLACEMENT LEFT EYE (Left Eye)  Patient Location: PACU  Anesthesia Type:MAC  Level of Consciousness: awake, alert , oriented and patient cooperative  Airway & Oxygen Therapy: Patient Spontanous Breathing  Post-op Assessment: Report given to RN, Post -op Vital signs reviewed and stable and Patient moving all extremities X 4  Post vital signs: Reviewed and stable  Last Vitals:  Vitals Value Taken Time  BP    Temp    Pulse    Resp    SpO2      Last Pain:  Vitals:   10/29/20 1149  TempSrc: (P) Oral  PainSc: (P) 0-No pain         Complications: No complications documented.

## 2020-11-12 DIAGNOSIS — D508 Other iron deficiency anemias: Secondary | ICD-10-CM | POA: Diagnosis not present

## 2020-11-12 DIAGNOSIS — E7849 Other hyperlipidemia: Secondary | ICD-10-CM | POA: Diagnosis not present

## 2020-11-12 DIAGNOSIS — I10 Essential (primary) hypertension: Secondary | ICD-10-CM | POA: Diagnosis not present

## 2020-11-16 DIAGNOSIS — D440 Neoplasm of uncertain behavior of thyroid gland: Secondary | ICD-10-CM | POA: Diagnosis not present

## 2020-11-16 DIAGNOSIS — R490 Dysphonia: Secondary | ICD-10-CM | POA: Diagnosis not present

## 2020-12-11 DIAGNOSIS — D508 Other iron deficiency anemias: Secondary | ICD-10-CM | POA: Diagnosis not present

## 2020-12-11 DIAGNOSIS — I10 Essential (primary) hypertension: Secondary | ICD-10-CM | POA: Diagnosis not present

## 2020-12-11 DIAGNOSIS — E7849 Other hyperlipidemia: Secondary | ICD-10-CM | POA: Diagnosis not present

## 2020-12-22 DIAGNOSIS — R490 Dysphonia: Secondary | ICD-10-CM | POA: Diagnosis not present

## 2020-12-30 ENCOUNTER — Inpatient Hospital Stay (HOSPITAL_COMMUNITY): Payer: Medicare Other | Attending: Hematology

## 2020-12-30 ENCOUNTER — Other Ambulatory Visit: Payer: Self-pay

## 2020-12-30 DIAGNOSIS — D509 Iron deficiency anemia, unspecified: Secondary | ICD-10-CM | POA: Diagnosis not present

## 2020-12-30 DIAGNOSIS — Z7982 Long term (current) use of aspirin: Secondary | ICD-10-CM | POA: Diagnosis not present

## 2020-12-30 DIAGNOSIS — I10 Essential (primary) hypertension: Secondary | ICD-10-CM | POA: Insufficient documentation

## 2020-12-30 DIAGNOSIS — D649 Anemia, unspecified: Secondary | ICD-10-CM

## 2020-12-30 DIAGNOSIS — J45909 Unspecified asthma, uncomplicated: Secondary | ICD-10-CM | POA: Diagnosis not present

## 2020-12-30 DIAGNOSIS — K219 Gastro-esophageal reflux disease without esophagitis: Secondary | ICD-10-CM | POA: Diagnosis not present

## 2020-12-30 DIAGNOSIS — F419 Anxiety disorder, unspecified: Secondary | ICD-10-CM | POA: Diagnosis not present

## 2020-12-30 DIAGNOSIS — Z79899 Other long term (current) drug therapy: Secondary | ICD-10-CM | POA: Diagnosis not present

## 2020-12-30 DIAGNOSIS — Z87891 Personal history of nicotine dependence: Secondary | ICD-10-CM | POA: Diagnosis not present

## 2020-12-30 LAB — CBC WITH DIFFERENTIAL/PLATELET
Abs Immature Granulocytes: 0.02 10*3/uL (ref 0.00–0.07)
Basophils Absolute: 0 10*3/uL (ref 0.0–0.1)
Basophils Relative: 1 %
Eosinophils Absolute: 0.2 10*3/uL (ref 0.0–0.5)
Eosinophils Relative: 4 %
HCT: 37.1 % (ref 36.0–46.0)
Hemoglobin: 11.8 g/dL — ABNORMAL LOW (ref 12.0–15.0)
Immature Granulocytes: 0 %
Lymphocytes Relative: 16 %
Lymphs Abs: 1 10*3/uL (ref 0.7–4.0)
MCH: 31.3 pg (ref 26.0–34.0)
MCHC: 31.8 g/dL (ref 30.0–36.0)
MCV: 98.4 fL (ref 80.0–100.0)
Monocytes Absolute: 0.4 10*3/uL (ref 0.1–1.0)
Monocytes Relative: 7 %
Neutro Abs: 4.2 10*3/uL (ref 1.7–7.7)
Neutrophils Relative %: 72 %
Platelets: 272 10*3/uL (ref 150–400)
RBC: 3.77 MIL/uL — ABNORMAL LOW (ref 3.87–5.11)
RDW: 14.8 % (ref 11.5–15.5)
WBC: 5.8 10*3/uL (ref 4.0–10.5)
nRBC: 0 % (ref 0.0–0.2)

## 2020-12-30 LAB — IRON AND TIBC
Iron: 54 ug/dL (ref 28–170)
Saturation Ratios: 15 % (ref 10.4–31.8)
TIBC: 355 ug/dL (ref 250–450)
UIBC: 301 ug/dL

## 2020-12-30 LAB — FERRITIN: Ferritin: 56 ng/mL (ref 11–307)

## 2020-12-31 ENCOUNTER — Encounter (HOSPITAL_COMMUNITY): Payer: Self-pay

## 2020-12-31 DIAGNOSIS — R490 Dysphonia: Secondary | ICD-10-CM | POA: Diagnosis not present

## 2021-01-06 ENCOUNTER — Other Ambulatory Visit: Payer: Self-pay

## 2021-01-06 ENCOUNTER — Inpatient Hospital Stay (HOSPITAL_COMMUNITY): Payer: Medicare Other

## 2021-01-06 ENCOUNTER — Inpatient Hospital Stay (HOSPITAL_BASED_OUTPATIENT_CLINIC_OR_DEPARTMENT_OTHER): Payer: Medicare Other | Admitting: Hematology

## 2021-01-06 VITALS — BP 122/74 | HR 78 | Temp 96.8°F | Resp 18 | Wt 200.4 lb

## 2021-01-06 VITALS — BP 115/60 | HR 79 | Temp 96.9°F | Resp 18

## 2021-01-06 DIAGNOSIS — I10 Essential (primary) hypertension: Secondary | ICD-10-CM | POA: Diagnosis not present

## 2021-01-06 DIAGNOSIS — Z87891 Personal history of nicotine dependence: Secondary | ICD-10-CM | POA: Diagnosis not present

## 2021-01-06 DIAGNOSIS — D509 Iron deficiency anemia, unspecified: Secondary | ICD-10-CM | POA: Diagnosis not present

## 2021-01-06 DIAGNOSIS — D500 Iron deficiency anemia secondary to blood loss (chronic): Secondary | ICD-10-CM

## 2021-01-06 DIAGNOSIS — D508 Other iron deficiency anemias: Secondary | ICD-10-CM

## 2021-01-06 DIAGNOSIS — D649 Anemia, unspecified: Secondary | ICD-10-CM

## 2021-01-06 DIAGNOSIS — N1831 Chronic kidney disease, stage 3a: Secondary | ICD-10-CM | POA: Diagnosis not present

## 2021-01-06 DIAGNOSIS — F419 Anxiety disorder, unspecified: Secondary | ICD-10-CM | POA: Diagnosis not present

## 2021-01-06 DIAGNOSIS — K219 Gastro-esophageal reflux disease without esophagitis: Secondary | ICD-10-CM | POA: Diagnosis not present

## 2021-01-06 DIAGNOSIS — J45909 Unspecified asthma, uncomplicated: Secondary | ICD-10-CM | POA: Diagnosis not present

## 2021-01-06 DIAGNOSIS — D631 Anemia in chronic kidney disease: Secondary | ICD-10-CM

## 2021-01-06 MED ORDER — SODIUM CHLORIDE 0.9 % IV SOLN: Freq: Once | INTRAVENOUS | Status: AC

## 2021-01-06 MED ORDER — SODIUM CHLORIDE 0.9 % IV SOLN
510.0000 mg | Freq: Once | INTRAVENOUS | Status: AC
  Administered 2021-01-06: 510 mg via INTRAVENOUS
  Filled 2021-01-06: qty 510

## 2021-01-06 NOTE — Patient Instructions (Signed)
[facility] Discharge Instructions  You were seen today by Dr. Katragadda. He went over your recent results. You will be scheduled to receive 2 iron infusions 1 week apart. Dr. Katragadda will see you back in 3 months for labs and follow up.   Thank you for choosing [facility] to provide your oncology and hematology care.  To afford each patient quality time with our provider, please arrive at least 15 minutes before your scheduled appointment time.   If you have a lab appointment with the Cancer Center please come in thru the Main Entrance and check in at the main information desk  You need to re-schedule your appointment should you arrive 10 or more minutes late.  We strive to give you quality time with our providers, and arriving late affects you and other patients whose appointments are after yours.  Also, if you no show three or more times for appointments you may be dismissed from the clinic at the providers discretion.     Again, thank you for choosing [facility].  Our hope is that these requests will decrease the amount of time that you wait before being seen by our physicians.       _____________________________________________________________  Should you have questions after your visit to [facility], please contact our office at [phone] between the hours of 8:00 a.m. and 4:30 p.m.  Voicemails left after 4:00 p.m. will not be returned until the following business day.  For prescription refill requests, have your pharmacy contact our office and allow 72 hours.    Cancer Center Support Programs:   > Cancer Support Group  2nd Tuesday of the month 1pm-2pm, Journey Room    
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
## 2021-01-06 NOTE — Progress Notes (Signed)
Patient presents today for Feraheme infusion.  Vital signs WNL.  Patient has no new complaints since last visit.  Feraheme given today per MD orders.  Tolerated infusion without adverse affects.  Vital signs stable.  No complaints at this time.  Discharge from clinic ambulatory in stable condition.  Alert and oriented X 3.  Follow up with [facility] as scheduled.

## 2021-01-06 NOTE — Progress Notes (Signed)
Patient was assessed by Dr. Delton Coombes and labs have been reviewed.  Patient is okay to proceed with feraheme treatment today. Primary RN and pharmacy aware.

## 2021-01-06 NOTE — Patient Instructions (Signed)
[facility] Discharge Instructions for Patients  Today you received the following.   To help prevent nausea and vomiting after your treatment, we encourage you to take your nausea medication If you develop nausea and vomiting that is not controlled by your nausea medication, call the clinic.   BELOW ARE SYMPTOMS THAT SHOULD BE REPORTED IMMEDIATELY:  *FEVER GREATER THAN 100.5 F  *CHILLS WITH OR WITHOUT FEVER  NAUSEA AND VOMITING THAT IS NOT CONTROLLED WITH YOUR NAUSEA MEDICATION  *UNUSUAL SHORTNESS OF BREATH  *UNUSUAL BRUISING OR BLEEDING  TENDERNESS IN MOUTH AND THROAT WITH OR WITHOUT PRESENCE OF ULCERS  *URINARY PROBLEMS  *BOWEL PROBLEMS  UNUSUAL RASH Items with * indicate a potential emergency and should be followed up as soon as possible.  Feel free to call the clinic should you have any questions or concerns. The clinic phone number is [phone].  Please show the CHEMO ALERT CARD at check-in to the Emergency Department and triage nurse.   

## 2021-01-10 DIAGNOSIS — E7849 Other hyperlipidemia: Secondary | ICD-10-CM | POA: Diagnosis not present

## 2021-01-10 DIAGNOSIS — D508 Other iron deficiency anemias: Secondary | ICD-10-CM | POA: Diagnosis not present

## 2021-01-10 DIAGNOSIS — I10 Essential (primary) hypertension: Secondary | ICD-10-CM | POA: Diagnosis not present

## 2021-01-14 ENCOUNTER — Other Ambulatory Visit: Payer: Self-pay

## 2021-01-14 ENCOUNTER — Inpatient Hospital Stay (HOSPITAL_COMMUNITY): Payer: Medicare Other | Attending: Hematology

## 2021-01-14 ENCOUNTER — Ambulatory Visit (HOSPITAL_COMMUNITY): Payer: Medicare Other

## 2021-01-14 VITALS — BP 112/52 | HR 68 | Temp 97.5°F | Resp 18

## 2021-01-14 DIAGNOSIS — D509 Iron deficiency anemia, unspecified: Secondary | ICD-10-CM | POA: Diagnosis not present

## 2021-01-14 DIAGNOSIS — R490 Dysphonia: Secondary | ICD-10-CM | POA: Diagnosis not present

## 2021-01-14 DIAGNOSIS — J385 Laryngeal spasm: Secondary | ICD-10-CM | POA: Diagnosis not present

## 2021-01-14 DIAGNOSIS — D649 Anemia, unspecified: Secondary | ICD-10-CM

## 2021-01-14 DIAGNOSIS — D508 Other iron deficiency anemias: Secondary | ICD-10-CM

## 2021-01-14 MED ORDER — SODIUM CHLORIDE 0.9% FLUSH: 10.0000 mL | Freq: Once | INTRAVENOUS | Status: DC | PRN

## 2021-01-14 MED ORDER — SODIUM CHLORIDE 0.9 % IV SOLN
510.0000 mg | Freq: Once | INTRAVENOUS | Status: AC
  Administered 2021-01-14: 510 mg via INTRAVENOUS
  Filled 2021-01-14: qty 510

## 2021-01-14 MED ORDER — SODIUM CHLORIDE 0.9 % IV SOLN: Freq: Once | INTRAVENOUS | Status: AC

## 2021-01-14 NOTE — Progress Notes (Signed)
Patient tolerated iron infusion well.  Peripheral IV site clean, dry, and intact.  Good blood return noted before and after infusion.  No bruising or swelling at IV site.  Band-aid applied.  Patient left in satisfactory condition with vital signs stable.  No signs or symptoms of distress noted.

## 2021-01-21 DIAGNOSIS — R490 Dysphonia: Secondary | ICD-10-CM | POA: Diagnosis not present

## 2021-01-21 DIAGNOSIS — J385 Laryngeal spasm: Secondary | ICD-10-CM | POA: Diagnosis not present

## 2021-02-04 DIAGNOSIS — R490 Dysphonia: Secondary | ICD-10-CM | POA: Diagnosis not present

## 2021-02-04 DIAGNOSIS — J385 Laryngeal spasm: Secondary | ICD-10-CM | POA: Diagnosis not present

## 2021-02-09 DIAGNOSIS — I10 Essential (primary) hypertension: Secondary | ICD-10-CM | POA: Diagnosis not present

## 2021-02-09 DIAGNOSIS — D508 Other iron deficiency anemias: Secondary | ICD-10-CM | POA: Diagnosis not present

## 2021-02-09 DIAGNOSIS — E7849 Other hyperlipidemia: Secondary | ICD-10-CM | POA: Diagnosis not present

## 2021-02-11 DIAGNOSIS — R490 Dysphonia: Secondary | ICD-10-CM | POA: Diagnosis not present

## 2021-02-11 DIAGNOSIS — J385 Laryngeal spasm: Secondary | ICD-10-CM | POA: Diagnosis not present

## 2021-02-17 DIAGNOSIS — E782 Mixed hyperlipidemia: Secondary | ICD-10-CM | POA: Diagnosis not present

## 2021-02-17 DIAGNOSIS — D509 Iron deficiency anemia, unspecified: Secondary | ICD-10-CM | POA: Diagnosis not present

## 2021-02-17 DIAGNOSIS — Z1331 Encounter for screening for depression: Secondary | ICD-10-CM | POA: Diagnosis not present

## 2021-02-17 DIAGNOSIS — Z6832 Body mass index (BMI) 32.0-32.9, adult: Secondary | ICD-10-CM | POA: Diagnosis not present

## 2021-02-17 DIAGNOSIS — I10 Essential (primary) hypertension: Secondary | ICD-10-CM | POA: Diagnosis not present

## 2021-02-17 DIAGNOSIS — Z1389 Encounter for screening for other disorder: Secondary | ICD-10-CM | POA: Diagnosis not present

## 2021-02-17 DIAGNOSIS — R739 Hyperglycemia, unspecified: Secondary | ICD-10-CM | POA: Diagnosis not present

## 2021-02-17 DIAGNOSIS — E7849 Other hyperlipidemia: Secondary | ICD-10-CM | POA: Diagnosis not present

## 2021-02-18 DIAGNOSIS — Z23 Encounter for immunization: Secondary | ICD-10-CM | POA: Diagnosis not present

## 2021-02-23 DIAGNOSIS — E041 Nontoxic single thyroid nodule: Secondary | ICD-10-CM | POA: Diagnosis not present

## 2021-03-23 ENCOUNTER — Encounter (HOSPITAL_COMMUNITY): Payer: Self-pay

## 2021-03-25 ENCOUNTER — Other Ambulatory Visit: Payer: Self-pay

## 2021-03-25 ENCOUNTER — Inpatient Hospital Stay (HOSPITAL_COMMUNITY): Payer: Medicare Other | Attending: Hematology

## 2021-03-25 DIAGNOSIS — D509 Iron deficiency anemia, unspecified: Secondary | ICD-10-CM | POA: Diagnosis not present

## 2021-03-25 DIAGNOSIS — N1831 Chronic kidney disease, stage 3a: Secondary | ICD-10-CM

## 2021-03-25 DIAGNOSIS — Z79899 Other long term (current) drug therapy: Secondary | ICD-10-CM | POA: Diagnosis not present

## 2021-03-25 DIAGNOSIS — D500 Iron deficiency anemia secondary to blood loss (chronic): Secondary | ICD-10-CM

## 2021-03-25 LAB — CBC WITH DIFFERENTIAL/PLATELET
Abs Immature Granulocytes: 0.01 10*3/uL (ref 0.00–0.07)
Basophils Absolute: 0.1 10*3/uL (ref 0.0–0.1)
Basophils Relative: 1 %
Eosinophils Absolute: 0.3 10*3/uL (ref 0.0–0.5)
Eosinophils Relative: 5 %
HCT: 31.8 % — ABNORMAL LOW (ref 36.0–46.0)
Hemoglobin: 10 g/dL — ABNORMAL LOW (ref 12.0–15.0)
Immature Granulocytes: 0 %
Lymphocytes Relative: 25 %
Lymphs Abs: 1.6 10*3/uL (ref 0.7–4.0)
MCH: 32.8 pg (ref 26.0–34.0)
MCHC: 31.4 g/dL (ref 30.0–36.0)
MCV: 104.3 fL — ABNORMAL HIGH (ref 80.0–100.0)
Monocytes Absolute: 0.5 10*3/uL (ref 0.1–1.0)
Monocytes Relative: 8 %
Neutro Abs: 4 10*3/uL (ref 1.7–7.7)
Neutrophils Relative %: 61 %
Platelets: 333 10*3/uL (ref 150–400)
RBC: 3.05 MIL/uL — ABNORMAL LOW (ref 3.87–5.11)
RDW: 15.7 % — ABNORMAL HIGH (ref 11.5–15.5)
WBC: 6.5 10*3/uL (ref 4.0–10.5)
nRBC: 0 % (ref 0.0–0.2)

## 2021-03-25 LAB — IRON AND TIBC
Iron: 82 ug/dL (ref 28–170)
Saturation Ratios: 22 % (ref 10.4–31.8)
TIBC: 371 ug/dL (ref 250–450)
UIBC: 289 ug/dL

## 2021-03-25 LAB — TSH: TSH: 3.327 u[IU]/mL (ref 0.350–4.500)

## 2021-03-25 LAB — FERRITIN: Ferritin: 53 ng/mL (ref 11–307)

## 2021-03-25 LAB — VITAMIN B12: Vitamin B-12: 859 pg/mL (ref 180–914)

## 2021-03-30 ENCOUNTER — Inpatient Hospital Stay (HOSPITAL_BASED_OUTPATIENT_CLINIC_OR_DEPARTMENT_OTHER): Payer: Medicare Other | Admitting: Physician Assistant

## 2021-03-30 ENCOUNTER — Encounter (HOSPITAL_COMMUNITY): Payer: Self-pay | Admitting: Lab

## 2021-03-30 ENCOUNTER — Other Ambulatory Visit: Payer: Self-pay

## 2021-03-30 DIAGNOSIS — D500 Iron deficiency anemia secondary to blood loss (chronic): Secondary | ICD-10-CM

## 2021-03-30 DIAGNOSIS — N183 Chronic kidney disease, stage 3 unspecified: Secondary | ICD-10-CM

## 2021-03-30 NOTE — Progress Notes (Unsigned)
Referral to Dr Theador Hawthorne  Records faxed on 5/18

## 2021-03-30 NOTE — Progress Notes (Signed)
Virtual Visit via Telephone Note [facility]  I connected with Candice [name] on 03/30/21  at 11:51 AM by telephone and verified that I am speaking with the correct person using two identifiers.  Location: Patient: Home Provider: [facility]   I discussed the limitations, risks, security and privacy concerns of performing an evaluation and management service by telephone and the availability of in person appointments. I also discussed with the patient that there may be a patient responsible charge related to this service. The patient expressed understanding and agreed to proceed.   History of Present Illness: [name] is a 70 year old female who follows at our clinic for anemia secondary to iron deficiency and CKD stage III.  She was last seen in our clinic by Dr. Delton Coombes on 01/06/2021.  Review of most recent labs (03/25/2021) shows macrocytic anemia with Hgb 10.0/MCV 104.3; platelets, WBC, and differential are normal; moderate iron deficiency (03/25/2021) with ferritin 53, iron saturation 22%; vitamin B12 and TSH are normal.  She reports that she is feeling fairly well.  She does report some generalized weakness and fatigue.  She initially felt better after her iron infusion in March, but has had progressive fatigue for the past two weeks.  She has some pre-syncopal episodes and postural lightheadedness.  She is able to complete her ADLs but requires more frequent rest breaks.  She has some mild dyspnea on exertion, but denies chest pain.  She has occasional scant blood on tissue paper after a bowel movement, but she denies any gross rectal hemorrhage, melena, or other sources of bleeding.  She reports that her energy is at 50%, appetite is 100%.  She is maintaining a stable weight.    Observations/Objective: Review of Systems  Constitutional: Positive for diaphoresis and malaise/fatigue. Negative for chills, fever and weight loss.  Respiratory:  Positive for shortness of breath (with exertion). Negative for cough.   Cardiovascular: Negative for chest pain and palpitations.  Gastrointestinal: Negative for abdominal pain and blood in stool.  Neurological: Positive for tingling (chronic peripheral neuropathy).     PHYSICAL EXAM (per limitations of virtual telephone visit): The patient is alert and oriented x 3, exhibiting adequate mentation, good mood, and ability to speak in full sentences and execute sound judgement.   Assessment: 1. Anemia due to iron deficiency and kidney disease: - Likely etiology is chronic bleeding from the small bowel, relative iron deficiency and CKD. - There may be an element of early MDS, noted macrocytosis with MCV 104.3 - She has had previous heme-positive stool and reports intermittent rectal bleeding - Last colonoscopy (07/24/2017) showed diverticulosis and external hemorrhoids - SPEP/IFE in May 2021 was negative for monoclonal gammopathy - CKD stage III with baseline creatinine 1.3-1.6 - Last Feraheme on 01/14/2021 - She denies any signs or symptoms of current blood loss, but is symptomatic with severe fatigue - Review of most recent labs (03/25/2021) shows macrocytic anemia with Hgb 10.0/MCV 104.3; platelets, WBC, and differential are normal - Moderate iron deficiency (03/25/2021) with ferritin 53, iron saturation 22%; vitamin B12 and TSH are normal - She failed to improve on oral iron supplementation and experienced severe constipation    Plan: 1. Anemia due to iron deficiency and kidney disease: - Review of most recent labs (03/25/2021) shows macrocytic anemia with Hgb 10.0/MCV 104.3; platelets, WBC, and differential are normal - Moderate iron deficiency (03/25/2021) with ferritin 53, iron saturation 22%; vitamin B12 and TSH are normal - There may be an  element of early MDS, will consider bone marrow biopsy if patient exhibits significant deviation from baseline blood counts - If Hgb continues to drop  below 10.0, will consider initiation of ESA treatment in the setting of CKD - She complains of severe fatigue. Hence we have recommended Feraheme x2. - She will need close follow-ups with repeat CBC and ferritin in 2 months -We will check stool cards x 3 and refer to GI if positive  2.  CKD stage III - CKD stage III with baseline creatinine 1.3-1.6 - She has never seen a nephrologist - Referral sent to Dr. Theador Hawthorne   Follow Up Instructions: - IV Feraheme x2 - Obtain stool cards x3 - Referral to Dr. Theador Hawthorne (nephrology) to establish care for CKD - Repeat labs and follow-up in 2 months   I discussed the assessment and treatment plan with the patient. The patient was provided an opportunity to ask questions and all were answered. The patient agreed with the plan and demonstrated an understanding of the instructions.   The patient was advised to call back or seek an in-person evaluation if the symptoms worsen or if the condition fails to improve as anticipated.  I provided 22 minutes of non-face-to-face time during this encounter.   Harriett Rush, PA-C

## 2021-04-01 ENCOUNTER — Encounter (HOSPITAL_COMMUNITY): Payer: Self-pay

## 2021-04-01 ENCOUNTER — Inpatient Hospital Stay (HOSPITAL_COMMUNITY): Payer: Medicare Other

## 2021-04-01 ENCOUNTER — Other Ambulatory Visit: Payer: Self-pay

## 2021-04-01 VITALS — BP 98/49 | HR 63 | Temp 97.5°F | Resp 18

## 2021-04-01 DIAGNOSIS — D649 Anemia, unspecified: Secondary | ICD-10-CM

## 2021-04-01 DIAGNOSIS — D508 Other iron deficiency anemias: Secondary | ICD-10-CM

## 2021-04-01 DIAGNOSIS — Z79899 Other long term (current) drug therapy: Secondary | ICD-10-CM | POA: Diagnosis not present

## 2021-04-01 DIAGNOSIS — D509 Iron deficiency anemia, unspecified: Secondary | ICD-10-CM | POA: Diagnosis not present

## 2021-04-01 MED ORDER — SODIUM CHLORIDE 0.9 % IV SOLN: Freq: Once | INTRAVENOUS | Status: AC

## 2021-04-01 MED ORDER — FERUMOXYTOL INJECTION 510 MG/17 ML
510.0000 mg | Freq: Once | INTRAVENOUS | Status: AC
  Administered 2021-04-01: 510 mg via INTRAVENOUS
  Filled 2021-04-01: qty 510

## 2021-04-01 MED ORDER — ACETAMINOPHEN 325 MG PO TABS: 650.0000 mg | ORAL_TABLET | Freq: Once | ORAL | Status: DC

## 2021-04-01 MED ORDER — LORATADINE 10 MG PO TABS: 10.0000 mg | ORAL_TABLET | Freq: Once | ORAL | Status: DC

## 2021-04-01 NOTE — Progress Notes (Signed)
Patient presents today for Feraheme iron infusion. Vital signs are stable. Patient denies any changes since the last iron infusion. Patient denies any complaints today. MAR reviewed and updated.   Feraheme given today per MD orders. Tolerated infusion without adverse affects. Vital signs stable. No complaints at this time. Discharged from clinic ambulatory in stable condition. Alert and oriented x 3. F/U with [facility] as scheduled.

## 2021-04-01 NOTE — Patient Instructions (Signed)
[facility]  Discharge Instructions: Thank you for choosing [facility] to provide your oncology and hematology care.  If you have a lab appointment with the Cancer Center, please come in thru the Main Entrance and check in at the main information desk.   We strive to give you quality time with your provider. You may need to reschedule your appointment if you arrive late (15 or more minutes).  Arriving late affects you and other patients whose appointments are after yours.  Also, if you miss three or more appointments without notifying the office, you may be dismissed from the clinic at the provider's discretion.      For prescription refill requests, have your pharmacy contact our office and allow 72 hours for refills to be completed.    Today you received the following Feraheme infusion.       To help prevent nausea and vomiting after your treatment, we encourage you to take your nausea medication as directed.  BELOW ARE SYMPTOMS THAT SHOULD BE REPORTED IMMEDIATELY: *FEVER GREATER THAN 100.4 F (38 C) OR HIGHER *CHILLS OR SWEATING *NAUSEA AND VOMITING THAT IS NOT CONTROLLED WITH YOUR NAUSEA MEDICATION *UNUSUAL SHORTNESS OF BREATH *UNUSUAL BRUISING OR BLEEDING *URINARY PROBLEMS (pain or burning when urinating, or frequent urination) *BOWEL PROBLEMS (unusual diarrhea, constipation, pain near the anus) TENDERNESS IN MOUTH AND THROAT WITH OR WITHOUT PRESENCE OF ULCERS (sore throat, sores in mouth, or a toothache) UNUSUAL RASH, SWELLING OR PAIN  UNUSUAL VAGINAL DISCHARGE OR ITCHING   Items with * indicate a potential emergency and should be followed up as soon as possible or go to the Emergency Department if any problems should occur.  Please show the CHEMOTHERAPY ALERT CARD or IMMUNOTHERAPY ALERT CARD at check-in to the Emergency Department and triage nurse.  Should you have questions after your visit or need to cancel or reschedule your appointment, please  contact [facility] [phone]  and follow the prompts.  Office hours are 8:00 a.m. to 4:30 p.m. Monday - Friday. Please note that voicemails left after 4:00 p.m. may not be returned until the following business day.  We are closed weekends and major holidays. You have access to a nurse at all times for urgent questions. Please call the main number to the clinic [phone] and follow the prompts.  For any non-urgent questions, you may also contact your provider using MyChart. We now offer e-Visits for anyone 18 and older to request care online for non-urgent symptoms. For details visit [email or web address].   Also download the MyChart app! Go to the app store, search "MyChart", open the app, select [facility], and log in with your MyChart username and password.  Due to Covid, a mask is required upon entering the hospital/clinic. If you do not have a mask, one will be given to you upon arrival. For doctor visits, patients may have 1 support person aged 18 or older with them. For treatment visits, patients cannot have anyone with them due to current Covid guidelines and our immunocompromised population.  

## 2021-04-07 ENCOUNTER — Other Ambulatory Visit (HOSPITAL_COMMUNITY): Payer: Medicare Other

## 2021-04-07 ENCOUNTER — Ambulatory Visit (HOSPITAL_COMMUNITY): Payer: Medicare Other | Admitting: Hematology

## 2021-04-08 ENCOUNTER — Other Ambulatory Visit: Payer: Self-pay

## 2021-04-08 ENCOUNTER — Encounter (HOSPITAL_COMMUNITY): Payer: Self-pay

## 2021-04-08 ENCOUNTER — Inpatient Hospital Stay (HOSPITAL_COMMUNITY): Payer: Medicare Other

## 2021-04-08 VITALS — BP 97/43 | HR 78 | Temp 97.0°F | Resp 18 | Wt 195.2 lb

## 2021-04-08 DIAGNOSIS — D509 Iron deficiency anemia, unspecified: Secondary | ICD-10-CM | POA: Diagnosis not present

## 2021-04-08 DIAGNOSIS — D649 Anemia, unspecified: Secondary | ICD-10-CM

## 2021-04-08 DIAGNOSIS — D508 Other iron deficiency anemias: Secondary | ICD-10-CM

## 2021-04-08 DIAGNOSIS — Z79899 Other long term (current) drug therapy: Secondary | ICD-10-CM | POA: Diagnosis not present

## 2021-04-08 MED ORDER — SODIUM CHLORIDE 0.9 % IV SOLN
510.0000 mg | Freq: Once | INTRAVENOUS | Status: AC
  Administered 2021-04-08: 510 mg via INTRAVENOUS
  Filled 2021-04-08: qty 17

## 2021-04-08 MED ORDER — SODIUM CHLORIDE 0.9 % IV SOLN: Freq: Once | INTRAVENOUS | Status: AC

## 2021-04-08 MED ORDER — ACETAMINOPHEN 325 MG PO TABS: 650.0000 mg | ORAL_TABLET | Freq: Once | ORAL | Status: DC

## 2021-04-08 MED ORDER — LORATADINE 10 MG PO TABS: 10.0000 mg | ORAL_TABLET | Freq: Once | ORAL | Status: DC

## 2021-04-08 NOTE — Patient Instructions (Signed)
[facility]  Discharge Instructions: Thank you for choosing [facility] to provide your oncology and hematology care.  If you have a lab appointment with the [facility], please come in thru the Main Entrance and check in at the main information desk.  We strive to give you quality time with your provider. You may need to reschedule your appointment if you arrive late (15 or more minutes).  Arriving late affects you and other patients whose appointments are after yours.  Also, if you miss three or more appointments without notifying the office, you may be dismissed from the clinic at the provider's discretion.        Today you received the following Feraheme infusion.     To help prevent nausea and vomiting after your treatment, we encourage you to take your nausea medication as directed.  BELOW ARE SYMPTOMS THAT SHOULD BE REPORTED IMMEDIATELY: . *FEVER GREATER THAN 100.4 F (38 C) OR HIGHER . *CHILLS OR SWEATING . *NAUSEA AND VOMITING THAT IS NOT CONTROLLED WITH YOUR NAUSEA MEDICATION . *UNUSUAL SHORTNESS OF BREATH . *UNUSUAL BRUISING OR BLEEDING . *URINARY PROBLEMS (pain or burning when urinating, or frequent urination) . *BOWEL PROBLEMS (unusual diarrhea, constipation, pain near the anus) . TENDERNESS IN MOUTH AND THROAT WITH OR WITHOUT PRESENCE OF ULCERS (sore throat, sores in mouth, or a toothache) . UNUSUAL RASH, SWELLING OR PAIN  . UNUSUAL VAGINAL DISCHARGE OR ITCHING   Items with * indicate a potential emergency and should be followed up as soon as possible or go to the Emergency Department if any problems should occur.  Please show the CHEMOTHERAPY ALERT CARD or IMMUNOTHERAPY ALERT CARD at check-in to the Emergency Department and triage nurse.  Should you have questions after your visit or need to cancel or reschedule your appointment, please contact [facility] [phone]  and follow the prompts.  Office hours are 8:00 a.m. to 4:30  p.m. Monday - Friday. Please note that voicemails left after 4:00 p.m. may not be returned until the following business day.  We are closed weekends and major holidays. You have access to a nurse at all times for urgent questions. Please call the main number to the clinic [phone] and follow the prompts.  For any non-urgent questions, you may also contact your provider using MyChart. We now offer e-Visits for anyone 66 and older to request care online for non-urgent symptoms. For details visit [email or web address].   Also download the MyChart app! Go to the app store, search "MyChart", open the app, select [facility], and log in with your MyChart username and password.  Due to Covid, a mask is required upon entering the hospital/clinic. If you do not have a mask, one will be given to you upon arrival. For doctor visits, patients may have 1 support person aged 70 or older with them. For treatment visits, patients cannot have anyone with them due to current Covid guidelines and our immunocompromised population.

## 2021-04-08 NOTE — Progress Notes (Signed)
Patient presents today for Feraheme infusion. Patient took Claritin and Tylenol this morning prior to arrival. Vital signs stable. Patient has complaints of fatigue. MAR reviewed and updated.   Feraheme  given today per MD orders. Tolerated infusion without adverse affects. Vital signs stable. No complaints at this time. Discharged from clinic ambulatory in stable condition. Alert and oriented x 3. F/U with [facility] as scheduled.

## 2021-04-11 DIAGNOSIS — E7849 Other hyperlipidemia: Secondary | ICD-10-CM | POA: Diagnosis not present

## 2021-04-11 DIAGNOSIS — D508 Other iron deficiency anemias: Secondary | ICD-10-CM | POA: Diagnosis not present

## 2021-04-11 DIAGNOSIS — I10 Essential (primary) hypertension: Secondary | ICD-10-CM | POA: Diagnosis not present

## 2021-04-15 ENCOUNTER — Other Ambulatory Visit (HOSPITAL_COMMUNITY): Payer: Medicare Other

## 2021-04-22 ENCOUNTER — Telehealth (HOSPITAL_COMMUNITY): Payer: Medicare Other | Admitting: Physician Assistant

## 2021-04-22 DIAGNOSIS — I129 Hypertensive chronic kidney disease with stage 1 through stage 4 chronic kidney disease, or unspecified chronic kidney disease: Secondary | ICD-10-CM | POA: Diagnosis not present

## 2021-04-22 DIAGNOSIS — R809 Proteinuria, unspecified: Secondary | ICD-10-CM | POA: Diagnosis not present

## 2021-04-22 DIAGNOSIS — E6609 Other obesity due to excess calories: Secondary | ICD-10-CM | POA: Diagnosis not present

## 2021-04-22 DIAGNOSIS — D508 Other iron deficiency anemias: Secondary | ICD-10-CM | POA: Diagnosis not present

## 2021-04-22 DIAGNOSIS — Z79899 Other long term (current) drug therapy: Secondary | ICD-10-CM | POA: Diagnosis not present

## 2021-04-22 DIAGNOSIS — N1832 Chronic kidney disease, stage 3b: Secondary | ICD-10-CM | POA: Diagnosis not present

## 2021-04-22 DIAGNOSIS — D638 Anemia in other chronic diseases classified elsewhere: Secondary | ICD-10-CM | POA: Diagnosis not present

## 2021-05-12 DIAGNOSIS — I10 Essential (primary) hypertension: Secondary | ICD-10-CM | POA: Diagnosis not present

## 2021-05-12 DIAGNOSIS — D508 Other iron deficiency anemias: Secondary | ICD-10-CM | POA: Diagnosis not present

## 2021-05-12 DIAGNOSIS — E7849 Other hyperlipidemia: Secondary | ICD-10-CM | POA: Diagnosis not present

## 2021-05-13 ENCOUNTER — Other Ambulatory Visit (HOSPITAL_COMMUNITY): Payer: Self-pay | Admitting: Nephrology

## 2021-05-13 ENCOUNTER — Other Ambulatory Visit: Payer: Self-pay | Admitting: Nephrology

## 2021-05-13 DIAGNOSIS — D508 Other iron deficiency anemias: Secondary | ICD-10-CM

## 2021-05-13 DIAGNOSIS — N189 Chronic kidney disease, unspecified: Secondary | ICD-10-CM

## 2021-05-19 ENCOUNTER — Other Ambulatory Visit: Payer: Self-pay

## 2021-05-19 ENCOUNTER — Telehealth (HOSPITAL_COMMUNITY): Payer: Self-pay | Admitting: *Deleted

## 2021-05-19 ENCOUNTER — Ambulatory Visit (HOSPITAL_COMMUNITY)
Admission: RE | Admit: 2021-05-19 | Discharge: 2021-05-19 | Disposition: A | Discharge status: Home / Self Care | Payer: Medicare Other | Source: Ambulatory Visit | Attending: Nephrology | Admitting: Nephrology

## 2021-05-19 DIAGNOSIS — R809 Proteinuria, unspecified: Secondary | ICD-10-CM | POA: Diagnosis not present

## 2021-05-19 DIAGNOSIS — D508 Other iron deficiency anemias: Secondary | ICD-10-CM | POA: Diagnosis not present

## 2021-05-19 DIAGNOSIS — I129 Hypertensive chronic kidney disease with stage 1 through stage 4 chronic kidney disease, or unspecified chronic kidney disease: Secondary | ICD-10-CM | POA: Diagnosis not present

## 2021-05-19 DIAGNOSIS — E6609 Other obesity due to excess calories: Secondary | ICD-10-CM | POA: Diagnosis not present

## 2021-05-19 DIAGNOSIS — Z79899 Other long term (current) drug therapy: Secondary | ICD-10-CM | POA: Diagnosis not present

## 2021-05-19 DIAGNOSIS — D638 Anemia in other chronic diseases classified elsewhere: Secondary | ICD-10-CM | POA: Diagnosis not present

## 2021-05-19 DIAGNOSIS — N189 Chronic kidney disease, unspecified: Secondary | ICD-10-CM | POA: Diagnosis not present

## 2021-05-19 DIAGNOSIS — N1832 Chronic kidney disease, stage 3b: Secondary | ICD-10-CM | POA: Diagnosis not present

## 2021-05-19 NOTE — Telephone Encounter (Signed)
TC received from patient requesting immediate assistance, as she is sitting in the parking lot across from the [facility] ED stating that her vision is blurred and she feels like she is going to pass out.  Had blood drawn at [facility] today and states she began feeling this way afterwards.  Offered to call 911 for her and emphasized the importance of her not driving home.  Said she is not comfortable going to the ER.  Made her aware that due to the severity of her symptoms she needed to be evaluated.  Attempted to call [facility] to have someone check on her, however got no response.  Notified her sister Bonnita Nasuti, who is going to contact patient and assist her with getting medical care.

## 2021-05-20 ENCOUNTER — Telehealth (HOSPITAL_COMMUNITY): Payer: Self-pay | Admitting: *Deleted

## 2021-05-20 ENCOUNTER — Other Ambulatory Visit (HOSPITAL_COMMUNITY): Payer: Self-pay | Admitting: Physician Assistant

## 2021-05-20 NOTE — Telephone Encounter (Signed)
Patient called this morning stating that she did not report to the ER yesterday, however is very weak, dizzy and generally is not feeling well.  Lab results requested from [facility] from 7/7.  Consulted with Tarri Abernethy, PA and labs were reviewed.  Patient placed on the schedule for Monday for 1 unit of blood and iron infusion.  Patient aware and verbalized understanding, however was opposed to going to the Emergency room at any point but I reiterated it once again.
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
## 2021-05-23 ENCOUNTER — Inpatient Hospital Stay (HOSPITAL_COMMUNITY): Payer: Medicare Other

## 2021-05-23 ENCOUNTER — Inpatient Hospital Stay (HOSPITAL_COMMUNITY): Payer: Medicare Other | Attending: Hematology

## 2021-05-23 ENCOUNTER — Inpatient Hospital Stay (HOSPITAL_BASED_OUTPATIENT_CLINIC_OR_DEPARTMENT_OTHER): Payer: Medicare Other | Admitting: Physician Assistant

## 2021-05-23 ENCOUNTER — Other Ambulatory Visit: Payer: Self-pay

## 2021-05-23 VITALS — BP 128/69 | HR 70 | Temp 96.9°F | Resp 18

## 2021-05-23 VITALS — BP 110/43 | HR 70 | Temp 96.7°F | Resp 18 | Wt 196.8 lb

## 2021-05-23 DIAGNOSIS — D649 Anemia, unspecified: Secondary | ICD-10-CM

## 2021-05-23 DIAGNOSIS — D508 Other iron deficiency anemias: Secondary | ICD-10-CM

## 2021-05-23 DIAGNOSIS — D509 Iron deficiency anemia, unspecified: Secondary | ICD-10-CM | POA: Insufficient documentation

## 2021-05-23 DIAGNOSIS — D500 Iron deficiency anemia secondary to blood loss (chronic): Secondary | ICD-10-CM

## 2021-05-23 LAB — CBC
HCT: 23.2 % — ABNORMAL LOW (ref 36.0–46.0)
Hemoglobin: 7.1 g/dL — ABNORMAL LOW (ref 12.0–15.0)
MCH: 32.9 pg (ref 26.0–34.0)
MCHC: 30.6 g/dL (ref 30.0–36.0)
MCV: 107.4 fL — ABNORMAL HIGH (ref 80.0–100.0)
Platelets: 329 10*3/uL (ref 150–400)
RBC: 2.16 MIL/uL — ABNORMAL LOW (ref 3.87–5.11)
RDW: 16.4 % — ABNORMAL HIGH (ref 11.5–15.5)
WBC: 7.4 10*3/uL (ref 4.0–10.5)
nRBC: 0.4 % — ABNORMAL HIGH (ref 0.0–0.2)

## 2021-05-23 LAB — PREPARE RBC (CROSSMATCH)

## 2021-05-23 LAB — ABO/RH: ABO/RH(D): O NEG

## 2021-05-23 MED ORDER — FAMOTIDINE 20 MG PO TABS
20.0000 mg | ORAL_TABLET | Freq: Once | ORAL | Status: AC
  Administered 2021-05-23: 20 mg via ORAL

## 2021-05-23 MED ORDER — SODIUM CHLORIDE 0.9 % IV SOLN
510.0000 mg | Freq: Once | INTRAVENOUS | Status: AC
  Administered 2021-05-23: 510 mg via INTRAVENOUS
  Filled 2021-05-23: qty 510

## 2021-05-23 MED ORDER — SODIUM CHLORIDE 0.9 % IV SOLN
Freq: Once | INTRAVENOUS | Status: AC
Start: 2021-05-23 — End: 2021-05-23

## 2021-05-23 MED ORDER — FAMOTIDINE 20 MG PO TABS
ORAL_TABLET | ORAL | Status: AC
  Filled 2021-05-23: qty 1

## 2021-05-23 MED ORDER — ACETAMINOPHEN 325 MG PO TABS: 650.0000 mg | ORAL_TABLET | Freq: Once | ORAL | Status: DC

## 2021-05-23 MED ORDER — SODIUM CHLORIDE 0.9% FLUSH
10.0000 mL | INTRAVENOUS | Status: DC | PRN
Start: 2021-05-23 — End: 2021-05-23

## 2021-05-23 MED ORDER — DIPHENHYDRAMINE HCL 25 MG PO CAPS
25.0000 mg | ORAL_CAPSULE | Freq: Once | ORAL | Status: AC
  Administered 2021-05-23: 25 mg via ORAL
  Filled 2021-05-23: qty 1

## 2021-05-23 MED ORDER — SODIUM CHLORIDE 0.9% IV SOLUTION
250.0000 mL | Freq: Once | INTRAVENOUS | Status: AC
Start: 2021-05-23 — End: 2021-05-23
  Administered 2021-05-23: 250 mL via INTRAVENOUS

## 2021-05-23 MED ORDER — LORATADINE 10 MG PO TABS: 10.0000 mg | ORAL_TABLET | Freq: Once | ORAL | Status: DC

## 2021-05-23 MED ORDER — FAMOTIDINE 20 MG IN NS 100 ML IVPB: 20.0000 mg | Freq: Once | INTRAVENOUS | Status: DC

## 2021-05-23 NOTE — Patient Instructions (Signed)
[facility]  Discharge Instructions: Thank you for choosing [facility] to provide your oncology and hematology care.  If you have a lab appointment with the [facility], please come in thru the Main Entrance and check in at the main information desk.  Wear comfortable clothing and clothing appropriate for easy access to any Portacath or PICC line.   We strive to give you quality time with your provider. You may need to reschedule your appointment if you arrive late (15 or more minutes).  Arriving late affects you and other patients whose appointments are after yours.  Also, if you miss three or more appointments without notifying the office, you may be dismissed from the clinic at the provider's discretion.      For prescription refill requests, have your pharmacy contact our office and allow 72 hours for refills to be completed.    Today you received the following: Feraheme and 1 unit of RBCs. Return as scheduled.   To help prevent nausea and vomiting after your treatment, we encourage you to take your nausea medication as directed.  BELOW ARE SYMPTOMS THAT SHOULD BE REPORTED IMMEDIATELY: *FEVER GREATER THAN 100.4 F (38 C) OR HIGHER *CHILLS OR SWEATING *NAUSEA AND VOMITING THAT IS NOT CONTROLLED WITH YOUR NAUSEA MEDICATION *UNUSUAL SHORTNESS OF BREATH *UNUSUAL BRUISING OR BLEEDING *URINARY PROBLEMS (pain or burning when urinating, or frequent urination) *BOWEL PROBLEMS (unusual diarrhea, constipation, pain near the anus) TENDERNESS IN MOUTH AND THROAT WITH OR WITHOUT PRESENCE OF ULCERS (sore throat, sores in mouth, or a toothache) UNUSUAL RASH, SWELLING OR PAIN  UNUSUAL VAGINAL DISCHARGE OR ITCHING   Items with * indicate a potential emergency and should be followed up as soon as possible or go to the Emergency Department if any problems should occur.  Please show the CHEMOTHERAPY ALERT CARD or IMMUNOTHERAPY ALERT CARD at check-in to the Emergency  Department and triage nurse.  Should you have questions after your visit or need to cancel or reschedule your appointment, please contact [facility] [phone]  and follow the prompts.  Office hours are 8:00 a.m. to 4:30 p.m. Monday - Friday. Please note that voicemails left after 4:00 p.m. may not be returned until the following business day.  We are closed weekends and major holidays. You have access to a nurse at all times for urgent questions. Please call the main number to the clinic [phone] and follow the prompts.  For any non-urgent questions, you may also contact your provider using MyChart. We now offer e-Visits for anyone 60 and older to request care online for non-urgent symptoms. For details visit [email or web address].   Also download the MyChart app! Go to the app store, search "MyChart", open the app, select [facility], and log in with your MyChart username and password.  Due to Covid, a mask is required upon entering the hospital/clinic. If you do not have a mask, one will be given to you upon arrival. For doctor visits, patients may have 1 support person aged 54 or older with them. For treatment visits, patients cannot have anyone with them due to current Covid guidelines and our immunocompromised population.

## 2021-05-23 NOTE — Addendum Note (Signed)
Addended by: Tarri Abernethy on: 05/23/2021 10:51 AM   Modules accepted: Orders

## 2021-05-23 NOTE — Progress Notes (Signed)
Patient informed RN that she had Tylenol and Claritin at home this morning. Patient tolerated iron infusion with no complaints voiced. Peripheral IV site clean and dry with good blood return noted before and after infusion.   Patient's hemoglobin 7.1. Patient educated and consent signed. Pre medicaions given. Patient received 1 unit of PRBCs with no complaints voiced. Peripheral IV site clean and dry with good blood return noted before and after infusion. Band aid applied. VSS with discharge and left in satisfactory condition with no s/s of distress noted.

## 2021-05-23 NOTE — Patient Instructions (Signed)
Candice [name] at [facility] Discharge Instructions  You were seen today by [name] for your anemia.  Your blood and your iron are both low.  This may be due to possible blood loss from your stomach or intestines.  Because of your low blood, we will give you 1 unit of blood today.  Because of your low iron, we will schedule you for IV iron x2.  Because we suspect that you might be losing blood from your stomach and intestines, we will check stool cards x3 and will also refer you to gastroenterologist.  LABS: Return in 6 weeks for follow-up labs  FOLLOW-UP APPOINTMENT: PHONE VISIT in 6 weeks   Thank you for choosing [place] at [facility] to provide your oncology and hematology care.  To afford each patient quality time with our provider, please arrive at least 15 minutes before your scheduled appointment time.   If you have a lab appointment with the [facility] please come in thru the Main Entrance and check in at the main information desk.  You need to re-schedule your appointment should you arrive 10 or more minutes late.  We strive to give you quality time with our providers, and arriving late affects you and other patients whose appointments are after yours.  Also, if you no show three or more times for appointments you may be dismissed from the clinic at the providers discretion.     Again, thank you for choosing [facility].  Our hope is that these requests will decrease the amount of time that you wait before being seen by our physicians.       _____________________________________________________________  Should you have questions after your visit to [facility], please contact our office at [phone] and follow the prompts.  Our office hours are 8:00 a.m. and 4:30 p.m. Monday - Friday.  Please note that voicemails left after 4:00 p.m. may not be returned until the following business day.   We are closed weekends and major holidays.  You do have access to a nurse 24-7, just call the main number to the clinic [phone] and do not press any options, hold on the line and a nurse will answer the phone.    For prescription refill requests, have your pharmacy contact our office and allow 72 hours.    Due to Covid, you will need to wear a mask upon entering the hospital. If you do not have a mask, a mask will be given to you at the Main Entrance upon arrival. For doctor visits, patients may have 1 support person age 23 or older with them. For treatment visits, patients can not have anyone with them due to social distancing guidelines and our immunocompromised population.

## 2021-05-24 LAB — TYPE AND SCREEN
ABO/RH(D): O NEG
Antibody Screen: NEGATIVE
Unit division: 0

## 2021-05-24 LAB — BPAM RBC
Blood Product Expiration Date: 202207262359
ISSUE DATE / TIME: 202207111216
Unit Type and Rh: 9500

## 2021-05-27 ENCOUNTER — Other Ambulatory Visit (HOSPITAL_COMMUNITY): Payer: Medicare Other

## 2021-05-30 ENCOUNTER — Inpatient Hospital Stay (HOSPITAL_COMMUNITY): Payer: Medicare Other

## 2021-05-30 ENCOUNTER — Other Ambulatory Visit: Payer: Self-pay

## 2021-05-30 ENCOUNTER — Encounter (HOSPITAL_COMMUNITY): Payer: Self-pay

## 2021-05-30 VITALS — BP 130/70 | HR 82 | Temp 96.9°F | Resp 18

## 2021-05-30 DIAGNOSIS — D508 Other iron deficiency anemias: Secondary | ICD-10-CM

## 2021-05-30 DIAGNOSIS — D500 Iron deficiency anemia secondary to blood loss (chronic): Secondary | ICD-10-CM

## 2021-05-30 DIAGNOSIS — D649 Anemia, unspecified: Secondary | ICD-10-CM

## 2021-05-30 DIAGNOSIS — D509 Iron deficiency anemia, unspecified: Secondary | ICD-10-CM | POA: Diagnosis not present

## 2021-05-30 LAB — OCCULT BLOOD X 1 CARD TO LAB, STOOL
Fecal Occult Bld: NEGATIVE
Fecal Occult Bld: NEGATIVE
Fecal Occult Bld: POSITIVE — AB

## 2021-05-30 MED ORDER — FAMOTIDINE 20 MG IN NS 100 ML IVPB
20.0000 mg | Freq: Once | INTRAVENOUS | Status: AC
  Administered 2021-05-30: 20 mg via INTRAVENOUS
  Filled 2021-05-30: qty 20

## 2021-05-30 MED ORDER — SODIUM CHLORIDE 0.9 % IV SOLN: Freq: Once | INTRAVENOUS | Status: AC

## 2021-05-30 MED ORDER — LORATADINE 10 MG PO TABS: 10.0000 mg | ORAL_TABLET | Freq: Once | ORAL | Status: DC

## 2021-05-30 MED ORDER — ACETAMINOPHEN 325 MG PO TABS: 650.0000 mg | ORAL_TABLET | Freq: Once | ORAL | Status: DC

## 2021-05-30 MED ORDER — SODIUM CHLORIDE 0.9 % IV SOLN
510.0000 mg | Freq: Once | INTRAVENOUS | Status: AC
  Administered 2021-05-30: 510 mg via INTRAVENOUS
  Filled 2021-05-30: qty 510

## 2021-05-30 NOTE — Patient Instructions (Signed)
Candice [name]  Discharge Instructions: Thank you for choosing [facility] to provide your oncology and hematology care.  If you have a lab appointment with the [facility], please come in thru the Main Entrance and check in at the main information desk.  Wear comfortable clothing and clothing appropriate for easy access to any Portacath or PICC line.   We strive to give you quality time with your provider. You may need to reschedule your appointment if you arrive late (15 or more minutes).  Arriving late affects you and other patients whose appointments are after yours.  Also, if you miss three or more appointments without notifying the office, you may be dismissed from the clinic at the provider's discretion.      For prescription refill requests, have your pharmacy contact our office and allow 72 hours for refills to be completed.    Today you received Feraheme iron therapy.  Please follow up as scheduled.     To help prevent nausea and vomiting after your treatment, we encourage you to take your nausea medication as directed.  BELOW ARE SYMPTOMS THAT SHOULD BE REPORTED IMMEDIATELY: *FEVER GREATER THAN 100.4 F (38 C) OR HIGHER *CHILLS OR SWEATING *NAUSEA AND VOMITING THAT IS NOT CONTROLLED WITH YOUR NAUSEA MEDICATION *UNUSUAL SHORTNESS OF BREATH *UNUSUAL BRUISING OR BLEEDING *URINARY PROBLEMS (pain or burning when urinating, or frequent urination) *BOWEL PROBLEMS (unusual diarrhea, constipation, pain near the anus) TENDERNESS IN MOUTH AND THROAT WITH OR WITHOUT PRESENCE OF ULCERS (sore throat, sores in mouth, or a toothache) UNUSUAL RASH, SWELLING OR PAIN  UNUSUAL VAGINAL DISCHARGE OR ITCHING   Items with * indicate a potential emergency and should be followed up as soon as possible or go to the Emergency Department if any problems should occur.  Please show the CHEMOTHERAPY ALERT CARD or IMMUNOTHERAPY ALERT CARD at check-in to the Emergency Department and  triage nurse.  Should you have questions after your visit or need to cancel or reschedule your appointment, please contact [facility] [phone]  and follow the prompts.  Office hours are 8:00 a.m. to 4:30 p.m. Monday - Friday. Please note that voicemails left after 4:00 p.m. may not be returned until the following business day.  We are closed weekends and major holidays. You have access to a nurse at all times for urgent questions. Please call the main number to the clinic [phone] and follow the prompts.  For any non-urgent questions, you may also contact your provider using MyChart. We now offer e-Visits for anyone 26 and older to request care online for non-urgent symptoms. For details visit [email or web address].   Also download the MyChart app! Go to the app store, search "MyChart", open the app, select [place], and log in with your MyChart username and password.  Due to Covid, a mask is required upon entering the hospital/clinic. If you do not have a mask, one will be given to you upon arrival. For doctor visits, patients may have 1 support person aged 5 or older with them. For treatment visits, patients cannot have anyone with them due to current Covid guidelines and our immunocompromised population.

## 2021-05-30 NOTE — Progress Notes (Signed)
Patient here today for [facility] therapy.  Patient's blood pressure was 103/53 today at arrival, but did come up before discharge to 130/70.  Patient voiced no complaints today.  Patient was discharged ambulatory in stable condition.

## 2021-06-01 DIAGNOSIS — D638 Anemia in other chronic diseases classified elsewhere: Secondary | ICD-10-CM | POA: Diagnosis not present

## 2021-06-01 DIAGNOSIS — E6609 Other obesity due to excess calories: Secondary | ICD-10-CM | POA: Diagnosis not present

## 2021-06-01 DIAGNOSIS — N189 Chronic kidney disease, unspecified: Secondary | ICD-10-CM | POA: Diagnosis not present

## 2021-06-01 DIAGNOSIS — I129 Hypertensive chronic kidney disease with stage 1 through stage 4 chronic kidney disease, or unspecified chronic kidney disease: Secondary | ICD-10-CM | POA: Diagnosis not present

## 2021-06-01 DIAGNOSIS — D472 Monoclonal gammopathy: Secondary | ICD-10-CM | POA: Diagnosis not present

## 2021-06-01 DIAGNOSIS — E1122 Type 2 diabetes mellitus with diabetic chronic kidney disease: Secondary | ICD-10-CM | POA: Diagnosis not present

## 2021-06-02 DIAGNOSIS — Z1231 Encounter for screening mammogram for malignant neoplasm of breast: Secondary | ICD-10-CM | POA: Diagnosis not present

## 2021-06-03 ENCOUNTER — Ambulatory Visit (HOSPITAL_COMMUNITY): Payer: Medicare Other | Admitting: Physician Assistant

## 2021-06-12 DIAGNOSIS — E7849 Other hyperlipidemia: Secondary | ICD-10-CM | POA: Diagnosis not present

## 2021-06-12 DIAGNOSIS — I10 Essential (primary) hypertension: Secondary | ICD-10-CM | POA: Diagnosis not present

## 2021-06-12 DIAGNOSIS — D508 Other iron deficiency anemias: Secondary | ICD-10-CM | POA: Diagnosis not present

## 2021-06-15 DIAGNOSIS — R922 Inconclusive mammogram: Secondary | ICD-10-CM | POA: Diagnosis not present

## 2021-06-15 DIAGNOSIS — R928 Other abnormal and inconclusive findings on diagnostic imaging of breast: Secondary | ICD-10-CM | POA: Diagnosis not present

## 2021-06-28 NOTE — Progress Notes (Signed)
Virtual Visit via Telephone Note [facility]  I connected with Candice [name]  on 06/29/21  at 3:00 PM  by telephone and verified that I am speaking with the correct person using two identifiers.  Location: Patient: Home Provider: [facility]   I discussed the limitations, risks, security and privacy concerns of performing an evaluation and management service by telephone and the availability of in person appointments. I also discussed with the patient that there may be a patient responsible charge related to this service. The patient expressed understanding and agreed to proceed.  REASON FOR VISIT:  Follow-up for iron deficiency anemia and anemia of CKD  PRIOR THERAPY: None  CURRENT THERAPY: Intermittent IV iron infusions (Last Feraheme 05/23/2021 and 05/30/2021)  INTERVAL HISTORY:  Candice [name] 70 y.o. female returns for routine follow-up of her anemia.  She was last seen by [name] on 05/23/2021.  At today's visit, she reports feeling fair.  No recent hospitalizations, surgeries, or changes in baseline health status.  Patient reports that she felt improved energy after her last IV iron infusion but is starting to feel "low" again.  She has not noticed any gross rectal hemorrhage or melena.  She has not had any more presyncopal episodes.  She continues to have some dyspnea on exertion.  She denies any chest pain, palpitations, dizziness on standing, or pulsatile tinnitus.  She continues to have chronic numbness and tingling in her hands and feet.  She has not yet followed up with gastroenterology for investigation of possible GI blood loss.  She has 50% energy and 100% appetite. She endorses that she is maintaining a stable weight.   Observations/Objective: Review of Systems  Constitutional:  Positive for malaise/fatigue. Negative for chills, diaphoresis, fever and weight loss.  Respiratory:  Positive for shortness of breath (on exertion).  Negative for cough.   Cardiovascular:  Negative for chest pain and palpitations.  Gastrointestinal:  Negative for abdominal pain, blood in stool, melena, nausea and vomiting.  Musculoskeletal:  Positive for joint pain (knees).  Neurological:  Positive for tingling. Negative for dizziness and headaches.    PHYSICAL EXAM (per limitations of virtual telephone visit): The patient is alert and oriented x 3, exhibiting adequate mentation, good mood, and ability to speak in full sentences and execute sound judgement.   ASSESSMENT & PLAN: 1.  Anemia due to iron deficiency and kidney disease: - Likely etiology is chronic bleeding from the small bowel, relative iron deficiency and CKD. - There may be an element of early MDS, noted macrocytosis with MCV 104.3 - She has had previous heme-positive stool and reports intermittent rectal bleeding - Last colonoscopy (07/24/2017) showed diverticulosis and external hemorrhoids - SPEP/IFE in May 2021 was negative for monoclonal gammopathy - CKD stage III with baseline creatinine 1.3-1.6 (follows with Dr. Theador Hawthorne) - Anemia noted to be macrocytic, but vitamin B12 and TSH are normal - She failed to improve on oral iron supplementation and experienced severe constipation - Last Feraheme on 04/01/2021 and 04/08/2021, and then again on 05/23/2021 on 05/30/2021 - CBC on 05/20/2021 showed Hgb 7.2 - was transfused PRBC x1 - Noted to have an acute drop in her hemoglobin on 05/23/2021, with Hgb 7.1 (compared to Hgb 10.0 two months prior).  Hemoccult stool positive x1, referred to gastroenterology for follow-up but has not yet seen them - She denies any signs or symptoms of current blood loss, but is symptomatic with fatigue and dyspnea on exertion - Most recent labs (  06/29/2021) show improved Hgb 10.9, ferritin 85, iron saturation 16% - PLAN: Due to her persistent fatigue and residual iron deficiency, would recommend addition IV Feraheme x2, with labs and RTC in 3 months.    Encouraged to follow-up with gastroenterology, will resend referral.  Patient educated on alarm symptoms that warrant immediate medical attention.  If she has Hgb < 10.0 despite iron repletion, would consider starting her on ESA injections.   PLAN SUMMARY & DISPOSITION: - Feraheme x2 - Labs in 3 months - RTC after labs - Referral sent to gastroenterology    I discussed the assessment and treatment plan with the patient. The patient was provided an opportunity to ask questions and all were answered. The patient agreed with the plan and demonstrated an understanding of the instructions.   The patient was advised to call back or seek an in-person evaluation if the symptoms worsen or if the condition fails to improve as anticipated.  I provided 21 minutes of non-face-to-face time during this encounter.  Harriett Rush, PA-C 06/29/2021 3:33 PM

## 2021-06-29 ENCOUNTER — Inpatient Hospital Stay (HOSPITAL_COMMUNITY): Payer: Medicare Other | Attending: Hematology

## 2021-06-29 ENCOUNTER — Encounter (HOSPITAL_COMMUNITY): Payer: Self-pay | Admitting: Physician Assistant

## 2021-06-29 ENCOUNTER — Inpatient Hospital Stay (HOSPITAL_BASED_OUTPATIENT_CLINIC_OR_DEPARTMENT_OTHER): Payer: Medicare Other | Admitting: Physician Assistant

## 2021-06-29 ENCOUNTER — Other Ambulatory Visit: Payer: Self-pay

## 2021-06-29 ENCOUNTER — Other Ambulatory Visit (HOSPITAL_COMMUNITY): Payer: Self-pay

## 2021-06-29 DIAGNOSIS — Z79899 Other long term (current) drug therapy: Secondary | ICD-10-CM | POA: Insufficient documentation

## 2021-06-29 DIAGNOSIS — D649 Anemia, unspecified: Secondary | ICD-10-CM

## 2021-06-29 DIAGNOSIS — D500 Iron deficiency anemia secondary to blood loss (chronic): Secondary | ICD-10-CM | POA: Diagnosis not present

## 2021-06-29 DIAGNOSIS — D508 Other iron deficiency anemias: Secondary | ICD-10-CM | POA: Diagnosis not present

## 2021-06-29 DIAGNOSIS — D631 Anemia in chronic kidney disease: Secondary | ICD-10-CM | POA: Diagnosis not present

## 2021-06-29 DIAGNOSIS — N183 Chronic kidney disease, stage 3 unspecified: Secondary | ICD-10-CM | POA: Diagnosis not present

## 2021-06-29 HISTORY — DX: Iron deficiency anemia secondary to blood loss (chronic): D50.0

## 2021-06-29 LAB — CBC WITH DIFFERENTIAL/PLATELET
Abs Immature Granulocytes: 0.03 10*3/uL (ref 0.00–0.07)
Basophils Absolute: 0 10*3/uL (ref 0.0–0.1)
Basophils Relative: 1 %
Eosinophils Absolute: 0.1 10*3/uL (ref 0.0–0.5)
Eosinophils Relative: 2 %
HCT: 34.2 % — ABNORMAL LOW (ref 36.0–46.0)
Hemoglobin: 10.9 g/dL — ABNORMAL LOW (ref 12.0–15.0)
Immature Granulocytes: 1 %
Lymphocytes Relative: 14 %
Lymphs Abs: 0.8 10*3/uL (ref 0.7–4.0)
MCH: 32.6 pg (ref 26.0–34.0)
MCHC: 31.9 g/dL (ref 30.0–36.0)
MCV: 102.4 fL — ABNORMAL HIGH (ref 80.0–100.0)
Monocytes Absolute: 0.5 10*3/uL (ref 0.1–1.0)
Monocytes Relative: 9 %
Neutro Abs: 4.2 10*3/uL (ref 1.7–7.7)
Neutrophils Relative %: 73 %
Platelets: 318 10*3/uL (ref 150–400)
RBC: 3.34 MIL/uL — ABNORMAL LOW (ref 3.87–5.11)
RDW: 14.6 % (ref 11.5–15.5)
WBC: 5.6 10*3/uL (ref 4.0–10.5)
nRBC: 0 % (ref 0.0–0.2)

## 2021-06-29 LAB — TYPE AND SCREEN
ABO/RH(D): O NEG
Antibody Screen: NEGATIVE

## 2021-06-29 LAB — IRON AND TIBC
Iron: 55 ug/dL (ref 28–170)
Saturation Ratios: 16 % (ref 10.4–31.8)
TIBC: 340 ug/dL (ref 250–450)
UIBC: 285 ug/dL

## 2021-06-29 LAB — FERRITIN: Ferritin: 85 ng/mL (ref 11–307)

## 2021-06-30 ENCOUNTER — Encounter (INDEPENDENT_AMBULATORY_CARE_PROVIDER_SITE_OTHER): Payer: Self-pay | Admitting: *Deleted

## 2021-07-04 ENCOUNTER — Other Ambulatory Visit: Payer: Self-pay

## 2021-07-04 ENCOUNTER — Encounter (HOSPITAL_COMMUNITY): Payer: Self-pay

## 2021-07-04 ENCOUNTER — Inpatient Hospital Stay (HOSPITAL_COMMUNITY): Payer: Medicare Other

## 2021-07-04 VITALS — BP 112/55 | HR 69 | Temp 97.1°F | Resp 18

## 2021-07-04 DIAGNOSIS — Z79899 Other long term (current) drug therapy: Secondary | ICD-10-CM | POA: Diagnosis not present

## 2021-07-04 DIAGNOSIS — D631 Anemia in chronic kidney disease: Secondary | ICD-10-CM | POA: Diagnosis not present

## 2021-07-04 DIAGNOSIS — D500 Iron deficiency anemia secondary to blood loss (chronic): Secondary | ICD-10-CM

## 2021-07-04 DIAGNOSIS — N183 Chronic kidney disease, stage 3 unspecified: Secondary | ICD-10-CM | POA: Diagnosis not present

## 2021-07-04 DIAGNOSIS — D508 Other iron deficiency anemias: Secondary | ICD-10-CM | POA: Diagnosis not present

## 2021-07-04 MED ORDER — SODIUM CHLORIDE 0.9 % IV SOLN: Freq: Once | INTRAVENOUS | Status: AC

## 2021-07-04 MED ORDER — SODIUM CHLORIDE 0.9 % IV SOLN
510.0000 mg | Freq: Once | INTRAVENOUS | Status: AC
  Administered 2021-07-04: 510 mg via INTRAVENOUS
  Filled 2021-07-04: qty 510

## 2021-07-04 MED ORDER — ACETAMINOPHEN 325 MG PO TABS
650.0000 mg | ORAL_TABLET | Freq: Once | ORAL | Status: DC
  Filled 2021-07-04: qty 2

## 2021-07-04 MED ORDER — LORATADINE 10 MG PO TABS
10.0000 mg | ORAL_TABLET | Freq: Once | ORAL | Status: DC
  Filled 2021-07-04: qty 1

## 2021-07-04 NOTE — Progress Notes (Signed)
Patient tolerated iron infusion with no complaints voiced.  Peripheral IV site clean and dry with good blood return noted before and after infusion.  Band aid applied.  VSS with discharge and left in satisfactory condition with no s/s of distress noted.   

## 2021-07-04 NOTE — Progress Notes (Signed)
Patient stated she took Tylenol and Claritin at home this morning around 0900.  Pharmacy notified.

## 2021-07-04 NOTE — Patient Instructions (Signed)
[facility]  Discharge Instructions: ?Thank you for choosing [facility] to provide your oncology and hematology care.  ?If you have a lab appointment with the Cancer Center, please come in thru the Main Entrance and check in at the main information desk. ? ?Wear comfortable clothing and clothing appropriate for easy access to any Portacath or PICC line.  ? ?We strive to give you quality time with your provider. You may need to reschedule your appointment if you arrive late (15 or more minutes).  Arriving late affects you and other patients whose appointments are after yours.  Also, if you miss three or more appointments without notifying the office, you may be dismissed from the clinic at the provider?s discretion.    ?  ?For prescription refill requests, have your pharmacy contact our office and allow 72 hours for refills to be completed.   ? ?Today you received the following Feraheme, return as scheduled. ?  ?To help prevent nausea and vomiting after your treatment, we encourage you to take your nausea medication as directed. ? ?BELOW ARE SYMPTOMS THAT SHOULD BE REPORTED IMMEDIATELY: ?*FEVER GREATER THAN 100.4 F (38 ?C) OR HIGHER ?*CHILLS OR SWEATING ?*NAUSEA AND VOMITING THAT IS NOT CONTROLLED WITH YOUR NAUSEA MEDICATION ?*UNUSUAL SHORTNESS OF BREATH ?*UNUSUAL BRUISING OR BLEEDING ?*URINARY PROBLEMS (pain or burning when urinating, or frequent urination) ?*BOWEL PROBLEMS (unusual diarrhea, constipation, pain near the anus) ?TENDERNESS IN MOUTH AND THROAT WITH OR WITHOUT PRESENCE OF ULCERS (sore throat, sores in mouth, or a toothache) ?UNUSUAL RASH, SWELLING OR PAIN  ?UNUSUAL VAGINAL DISCHARGE OR ITCHING  ? ?Items with * indicate a potential emergency and should be followed up as soon as possible or go to the Emergency Department if any problems should occur. ? ?Please show the CHEMOTHERAPY ALERT CARD or IMMUNOTHERAPY ALERT CARD at check-in to the Emergency Department and triage  nurse. ? ?Should you have questions after your visit or need to cancel or reschedule your appointment, please contact [facility] [phone]  and follow the prompts.  Office hours are 8:00 a.m. to 4:30 p.m. Monday - Friday. Please note that voicemails left after 4:00 p.m. may not be returned until the following business day.  We are closed weekends and major holidays. You have access to a nurse at all times for urgent questions. Please call the main number to the clinic [phone] and follow the prompts. ? ?For any non-urgent questions, you may also contact your provider using MyChart. We now offer e-Visits for anyone 18 and older to request care online for non-urgent symptoms. For details visit [email or web address]. ?  ?Also download the MyChart app! Go to the app store, search "MyChart", open the app, select [facility], and log in with your MyChart username and password. ? ?Due to Covid, a mask is required upon entering the hospital/clinic. If you do not have a mask, one will be given to you upon arrival. For doctor visits, patients may have 1 support person aged 18 or older with them. For treatment visits, patients cannot have anyone with them due to current Covid guidelines and our immunocompromised population.  ?

## 2021-07-11 ENCOUNTER — Inpatient Hospital Stay (HOSPITAL_COMMUNITY): Payer: Medicare Other

## 2021-07-11 ENCOUNTER — Encounter (HOSPITAL_COMMUNITY): Payer: Self-pay

## 2021-07-11 VITALS — BP 116/65 | HR 76 | Temp 97.1°F | Resp 18

## 2021-07-11 DIAGNOSIS — Z79899 Other long term (current) drug therapy: Secondary | ICD-10-CM | POA: Diagnosis not present

## 2021-07-11 DIAGNOSIS — D631 Anemia in chronic kidney disease: Secondary | ICD-10-CM | POA: Diagnosis not present

## 2021-07-11 DIAGNOSIS — D500 Iron deficiency anemia secondary to blood loss (chronic): Secondary | ICD-10-CM

## 2021-07-11 DIAGNOSIS — D508 Other iron deficiency anemias: Secondary | ICD-10-CM | POA: Diagnosis not present

## 2021-07-11 DIAGNOSIS — Z20822 Contact with and (suspected) exposure to covid-19: Secondary | ICD-10-CM | POA: Diagnosis not present

## 2021-07-11 DIAGNOSIS — N183 Chronic kidney disease, stage 3 unspecified: Secondary | ICD-10-CM | POA: Diagnosis not present

## 2021-07-11 MED ORDER — LORATADINE 10 MG PO TABS
10.0000 mg | ORAL_TABLET | Freq: Once | ORAL | Status: DC
  Filled 2021-07-11: qty 1

## 2021-07-11 MED ORDER — SODIUM CHLORIDE 0.9 % IV SOLN
510.0000 mg | Freq: Once | INTRAVENOUS | Status: AC
  Administered 2021-07-11: 510 mg via INTRAVENOUS
  Filled 2021-07-11: qty 17

## 2021-07-11 MED ORDER — ACETAMINOPHEN 325 MG PO TABS
650.0000 mg | ORAL_TABLET | Freq: Once | ORAL | Status: DC
  Filled 2021-07-11: qty 2

## 2021-07-11 MED ORDER — SODIUM CHLORIDE 0.9 % IV SOLN: Freq: Once | INTRAVENOUS | Status: AC

## 2021-07-11 NOTE — Patient Instructions (Signed)
[facility]  Discharge Instructions: Thank you for choosing [facility] to provide your oncology and hematology care.  If you have a lab appointment with the Cancer Center, please come in thru the Main Entrance and check in at the main information desk.  Wear comfortable clothing and clothing appropriate for easy access to any Portacath or PICC line.   We strive to give you quality time with your provider. You may need to reschedule your appointment if you arrive late (15 or more minutes).  Arriving late affects you and other patients whose appointments are after yours.  Also, if you miss three or more appointments without notifying the office, you may be dismissed from the clinic at the provider's discretion.      For prescription refill requests, have your pharmacy contact our office and allow 72 hours for refills to be completed.    Today you received Feraheme IV iron infusion.     BELOW ARE SYMPTOMS THAT SHOULD BE REPORTED IMMEDIATELY: *FEVER GREATER THAN 100.4 F (38 C) OR HIGHER *CHILLS OR SWEATING *NAUSEA AND VOMITING THAT IS NOT CONTROLLED WITH YOUR NAUSEA MEDICATION *UNUSUAL SHORTNESS OF BREATH *UNUSUAL BRUISING OR BLEEDING *URINARY PROBLEMS (pain or burning when urinating, or frequent urination) *BOWEL PROBLEMS (unusual diarrhea, constipation, pain near the anus) TENDERNESS IN MOUTH AND THROAT WITH OR WITHOUT PRESENCE OF ULCERS (sore throat, sores in mouth, or a toothache) UNUSUAL RASH, SWELLING OR PAIN  UNUSUAL VAGINAL DISCHARGE OR ITCHING   Items with * indicate a potential emergency and should be followed up as soon as possible or go to the Emergency Department if any problems should occur.  Please show the CHEMOTHERAPY ALERT CARD or IMMUNOTHERAPY ALERT CARD at check-in to the Emergency Department and triage nurse.  Should you have questions after your visit or need to cancel or reschedule your appointment, please contact [facility]  [phone]  and follow the prompts.  Office hours are 8:00 a.m. to 4:30 p.m. Monday - Friday. Please note that voicemails left after 4:00 p.m. may not be returned until the following business day.  We are closed weekends and major holidays. You have access to a nurse at all times for urgent questions. Please call the main number to the clinic [phone] and follow the prompts.  For any non-urgent questions, you may also contact your provider using MyChart. We now offer e-Visits for anyone 18 and older to request care online for non-urgent symptoms. For details visit [email or web address].   Also download the MyChart app! Go to the app store, search "MyChart", open the app, select [facility], and log in with your MyChart username and password.  Due to Covid, a mask is required upon entering the hospital/clinic. If you do not have a mask, one will be given to you upon arrival. For doctor visits, patients may have 1 support person aged 18 or older with them. For treatment visits, patients cannot have anyone with them due to current Covid guidelines and our immunocompromised population.  

## 2021-07-11 NOTE — Progress Notes (Signed)
Pt presents today for Feraheme per provider's order. Vital signs stable and pt voiced no new complaints at this time. Pt took pre-meds Tylenol and Claritin prior to arrival pharmacy made aware.  Peripheral IV started with good blood return pre and post infusion.  Feraheme given today per MD orders. Tolerated infusion without adverse affects. Vital signs stable. No complaints at this time. Discharged from clinic ambulatory in stable condition. Alert and oriented x 3. F/U with [facility] as scheduled.

## 2021-07-13 DIAGNOSIS — I10 Essential (primary) hypertension: Secondary | ICD-10-CM | POA: Diagnosis not present

## 2021-07-13 DIAGNOSIS — E7849 Other hyperlipidemia: Secondary | ICD-10-CM | POA: Diagnosis not present

## 2021-07-13 DIAGNOSIS — D508 Other iron deficiency anemias: Secondary | ICD-10-CM | POA: Diagnosis not present

## 2021-07-14 DIAGNOSIS — Z20822 Contact with and (suspected) exposure to covid-19: Secondary | ICD-10-CM | POA: Diagnosis not present

## 2021-07-29 DIAGNOSIS — Z23 Encounter for immunization: Secondary | ICD-10-CM | POA: Diagnosis not present

## 2021-08-10 DIAGNOSIS — R059 Cough, unspecified: Secondary | ICD-10-CM | POA: Diagnosis not present

## 2021-08-10 DIAGNOSIS — J019 Acute sinusitis, unspecified: Secondary | ICD-10-CM | POA: Diagnosis not present

## 2021-08-10 DIAGNOSIS — Z20828 Contact with and (suspected) exposure to other viral communicable diseases: Secondary | ICD-10-CM | POA: Diagnosis not present

## 2021-08-13 DIAGNOSIS — Z20822 Contact with and (suspected) exposure to covid-19: Secondary | ICD-10-CM | POA: Diagnosis not present

## 2021-08-19 DIAGNOSIS — Z23 Encounter for immunization: Secondary | ICD-10-CM | POA: Diagnosis not present

## 2021-08-30 ENCOUNTER — Telehealth (HOSPITAL_COMMUNITY): Payer: Self-pay | Admitting: *Deleted

## 2021-08-30 ENCOUNTER — Other Ambulatory Visit (HOSPITAL_COMMUNITY): Payer: Self-pay | Admitting: *Deleted

## 2021-08-30 DIAGNOSIS — D500 Iron deficiency anemia secondary to blood loss (chronic): Secondary | ICD-10-CM

## 2021-08-30 NOTE — Telephone Encounter (Signed)
Received a TC from patient stating that she is feeling like her iron is low.  Little energy and foggy.  Appointment made for the am for labs and will review with provider once resulted to assess the need for intervention.  Patient verbalized understanding.

## 2021-08-31 ENCOUNTER — Encounter (HOSPITAL_COMMUNITY): Payer: Self-pay | Admitting: Hematology

## 2021-08-31 ENCOUNTER — Other Ambulatory Visit: Payer: Self-pay

## 2021-08-31 ENCOUNTER — Inpatient Hospital Stay (HOSPITAL_COMMUNITY): Payer: Medicare Other | Attending: Hematology

## 2021-08-31 DIAGNOSIS — D500 Iron deficiency anemia secondary to blood loss (chronic): Secondary | ICD-10-CM

## 2021-08-31 DIAGNOSIS — N189 Chronic kidney disease, unspecified: Secondary | ICD-10-CM | POA: Diagnosis not present

## 2021-08-31 DIAGNOSIS — D508 Other iron deficiency anemias: Secondary | ICD-10-CM | POA: Insufficient documentation

## 2021-08-31 DIAGNOSIS — D631 Anemia in chronic kidney disease: Secondary | ICD-10-CM | POA: Insufficient documentation

## 2021-08-31 LAB — IRON AND TIBC
Iron: 79 ug/dL (ref 28–170)
Saturation Ratios: 24 % (ref 10.4–31.8)
TIBC: 336 ug/dL (ref 250–450)
UIBC: 257 ug/dL

## 2021-08-31 LAB — CBC WITH DIFFERENTIAL/PLATELET
Abs Immature Granulocytes: 0.02 10*3/uL (ref 0.00–0.07)
Basophils Absolute: 0.1 10*3/uL (ref 0.0–0.1)
Basophils Relative: 1 %
Eosinophils Absolute: 0.3 10*3/uL (ref 0.0–0.5)
Eosinophils Relative: 5 %
HCT: 39.1 % (ref 36.0–46.0)
Hemoglobin: 12.6 g/dL (ref 12.0–15.0)
Immature Granulocytes: 0 %
Lymphocytes Relative: 27 %
Lymphs Abs: 1.5 10*3/uL (ref 0.7–4.0)
MCH: 31.7 pg (ref 26.0–34.0)
MCHC: 32.2 g/dL (ref 30.0–36.0)
MCV: 98.5 fL (ref 80.0–100.0)
Monocytes Absolute: 0.6 10*3/uL (ref 0.1–1.0)
Monocytes Relative: 11 %
Neutro Abs: 3.2 10*3/uL (ref 1.7–7.7)
Neutrophils Relative %: 56 %
Platelets: 283 10*3/uL (ref 150–400)
RBC: 3.97 MIL/uL (ref 3.87–5.11)
RDW: 14.3 % (ref 11.5–15.5)
WBC: 5.6 10*3/uL (ref 4.0–10.5)
nRBC: 0 % (ref 0.0–0.2)

## 2021-08-31 LAB — SAMPLE TO BLOOD BANK

## 2021-08-31 LAB — FERRITIN: Ferritin: 187 ng/mL (ref 11–307)

## 2021-08-31 NOTE — Telephone Encounter (Signed)
Reached out to patient to advise that her labs were normal.  Made her aware that she should touch base with her PCP for evaluation of symptoms.  She will keep her appointment for November for labs and follow up with Tarri Abernethy.

## 2021-09-01 DIAGNOSIS — D508 Other iron deficiency anemias: Secondary | ICD-10-CM | POA: Diagnosis not present

## 2021-09-01 DIAGNOSIS — D631 Anemia in chronic kidney disease: Secondary | ICD-10-CM | POA: Diagnosis not present

## 2021-09-01 DIAGNOSIS — N189 Chronic kidney disease, unspecified: Secondary | ICD-10-CM | POA: Diagnosis not present

## 2021-09-01 LAB — RENAL FUNCTION PANEL
Albumin: 4.1 g/dL (ref 3.5–5.0)
Anion gap: 8 (ref 5–15)
BUN: 21 mg/dL (ref 8–23)
CO2: 27 mmol/L (ref 22–32)
Calcium: 9.9 mg/dL (ref 8.9–10.3)
Chloride: 101 mmol/L (ref 98–111)
Creatinine, Ser: 1.22 mg/dL — ABNORMAL HIGH (ref 0.44–1.00)
GFR, Estimated: 47 mL/min — ABNORMAL LOW (ref >60)
Glucose, Bld: 100 mg/dL — ABNORMAL HIGH (ref 70–99)
Phosphorus: 3.6 mg/dL (ref 2.5–4.6)
Potassium: 3.8 mmol/L (ref 3.5–5.1)
Sodium: 136 mmol/L (ref 135–145)

## 2021-09-02 DIAGNOSIS — E6609 Other obesity due to excess calories: Secondary | ICD-10-CM | POA: Diagnosis not present

## 2021-09-02 DIAGNOSIS — E1122 Type 2 diabetes mellitus with diabetic chronic kidney disease: Secondary | ICD-10-CM | POA: Diagnosis not present

## 2021-09-02 DIAGNOSIS — D472 Monoclonal gammopathy: Secondary | ICD-10-CM | POA: Diagnosis not present

## 2021-09-02 DIAGNOSIS — N189 Chronic kidney disease, unspecified: Secondary | ICD-10-CM | POA: Diagnosis not present

## 2021-09-02 DIAGNOSIS — D508 Other iron deficiency anemias: Secondary | ICD-10-CM | POA: Diagnosis not present

## 2021-09-02 DIAGNOSIS — I129 Hypertensive chronic kidney disease with stage 1 through stage 4 chronic kidney disease, or unspecified chronic kidney disease: Secondary | ICD-10-CM | POA: Diagnosis not present

## 2021-09-09 DIAGNOSIS — Z6833 Body mass index (BMI) 33.0-33.9, adult: Secondary | ICD-10-CM | POA: Diagnosis not present

## 2021-09-09 DIAGNOSIS — M79604 Pain in right leg: Secondary | ICD-10-CM | POA: Diagnosis not present

## 2021-09-12 DIAGNOSIS — M810 Age-related osteoporosis without current pathological fracture: Secondary | ICD-10-CM | POA: Diagnosis not present

## 2021-09-12 DIAGNOSIS — Z7952 Long term (current) use of systemic steroids: Secondary | ICD-10-CM | POA: Diagnosis not present

## 2021-09-13 DIAGNOSIS — Z20822 Contact with and (suspected) exposure to covid-19: Secondary | ICD-10-CM | POA: Diagnosis not present

## 2021-09-20 DIAGNOSIS — J0101 Acute recurrent maxillary sinusitis: Secondary | ICD-10-CM | POA: Diagnosis not present

## 2021-09-20 DIAGNOSIS — R059 Cough, unspecified: Secondary | ICD-10-CM | POA: Diagnosis not present

## 2021-09-21 DIAGNOSIS — M79604 Pain in right leg: Secondary | ICD-10-CM | POA: Diagnosis not present

## 2021-09-27 ENCOUNTER — Other Ambulatory Visit (HOSPITAL_COMMUNITY): Payer: Medicare Other

## 2021-09-27 ENCOUNTER — Other Ambulatory Visit (HOSPITAL_COMMUNITY): Payer: Self-pay

## 2021-09-27 ENCOUNTER — Inpatient Hospital Stay (HOSPITAL_COMMUNITY): Payer: Medicare Other

## 2021-09-27 ENCOUNTER — Inpatient Hospital Stay (HOSPITAL_COMMUNITY): Payer: Medicare Other | Attending: Physician Assistant

## 2021-09-27 ENCOUNTER — Other Ambulatory Visit: Payer: Self-pay

## 2021-09-27 DIAGNOSIS — D509 Iron deficiency anemia, unspecified: Secondary | ICD-10-CM | POA: Insufficient documentation

## 2021-09-27 DIAGNOSIS — D649 Anemia, unspecified: Secondary | ICD-10-CM

## 2021-09-27 DIAGNOSIS — N183 Chronic kidney disease, stage 3 unspecified: Secondary | ICD-10-CM | POA: Diagnosis not present

## 2021-09-27 DIAGNOSIS — Z79899 Other long term (current) drug therapy: Secondary | ICD-10-CM | POA: Insufficient documentation

## 2021-09-27 DIAGNOSIS — D500 Iron deficiency anemia secondary to blood loss (chronic): Secondary | ICD-10-CM

## 2021-09-27 DIAGNOSIS — Z87891 Personal history of nicotine dependence: Secondary | ICD-10-CM | POA: Diagnosis not present

## 2021-09-27 DIAGNOSIS — D631 Anemia in chronic kidney disease: Secondary | ICD-10-CM | POA: Insufficient documentation

## 2021-09-27 LAB — IRON AND TIBC
Iron: 107 ug/dL (ref 28–170)
Saturation Ratios: 32 % — ABNORMAL HIGH (ref 10.4–31.8)
TIBC: 335 ug/dL (ref 250–450)
UIBC: 228 ug/dL

## 2021-09-27 LAB — FERRITIN: Ferritin: 88 ng/mL (ref 11–307)

## 2021-09-27 LAB — CBC WITH DIFFERENTIAL/PLATELET
Abs Immature Granulocytes: 0.04 10*3/uL (ref 0.00–0.07)
Basophils Absolute: 0.1 10*3/uL (ref 0.0–0.1)
Basophils Relative: 1 %
Eosinophils Absolute: 0.3 10*3/uL (ref 0.0–0.5)
Eosinophils Relative: 4 %
HCT: 34 % — ABNORMAL LOW (ref 36.0–46.0)
Hemoglobin: 11.1 g/dL — ABNORMAL LOW (ref 12.0–15.0)
Immature Granulocytes: 1 %
Lymphocytes Relative: 24 %
Lymphs Abs: 1.7 10*3/uL (ref 0.7–4.0)
MCH: 32.2 pg (ref 26.0–34.0)
MCHC: 32.6 g/dL (ref 30.0–36.0)
MCV: 98.6 fL (ref 80.0–100.0)
Monocytes Absolute: 0.5 10*3/uL (ref 0.1–1.0)
Monocytes Relative: 8 %
Neutro Abs: 4.4 10*3/uL (ref 1.7–7.7)
Neutrophils Relative %: 62 %
Platelets: 274 10*3/uL (ref 150–400)
RBC: 3.45 MIL/uL — ABNORMAL LOW (ref 3.87–5.11)
RDW: 14.7 % (ref 11.5–15.5)
WBC: 7 10*3/uL (ref 4.0–10.5)
nRBC: 0 % (ref 0.0–0.2)

## 2021-09-27 LAB — TYPE AND SCREEN
ABO/RH(D): O NEG
Antibody Screen: NEGATIVE

## 2021-09-28 ENCOUNTER — Telehealth (HOSPITAL_COMMUNITY): Payer: Self-pay | Admitting: *Deleted

## 2021-09-28 ENCOUNTER — Other Ambulatory Visit (HOSPITAL_COMMUNITY): Payer: Self-pay | Admitting: Physician Assistant

## 2021-09-28 NOTE — Telephone Encounter (Signed)
Patient called concerned about her iron levels. States that she is weak and short of breath.  Levels reviewed by Tarri Abernethy - PA.  Per her advise, patient was notified that her levels are not to the point she should be having profound symptoms from it and there may be another cause and she should contact her PCP.  However, she has been scheduled for an iron infusion for November 22nd after her appointment with Rebekah.  Verbalized understanding.

## 2021-09-29 DIAGNOSIS — J111 Influenza due to unidentified influenza virus with other respiratory manifestations: Secondary | ICD-10-CM | POA: Diagnosis not present

## 2021-10-03 ENCOUNTER — Other Ambulatory Visit: Payer: Self-pay

## 2021-10-03 ENCOUNTER — Ambulatory Visit (INDEPENDENT_AMBULATORY_CARE_PROVIDER_SITE_OTHER): Payer: Medicare Other | Admitting: Gastroenterology

## 2021-10-03 ENCOUNTER — Encounter (INDEPENDENT_AMBULATORY_CARE_PROVIDER_SITE_OTHER): Payer: Self-pay | Admitting: Gastroenterology

## 2021-10-03 VITALS — BP 121/70 | HR 85 | Temp 97.8°F | Ht 66.0 in | Wt 201.5 lb

## 2021-10-03 DIAGNOSIS — D508 Other iron deficiency anemias: Secondary | ICD-10-CM | POA: Diagnosis not present
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
## 2021-10-03 NOTE — Patient Instructions (Addendum)
Schedule EGD and colonoscopy Continue iron management by Dr. Edgardo Roys office Will reach PCP regarding chronic fatigue

## 2021-10-04 ENCOUNTER — Inpatient Hospital Stay (HOSPITAL_COMMUNITY): Payer: Medicare Other

## 2021-10-04 ENCOUNTER — Ambulatory Visit (HOSPITAL_COMMUNITY): Payer: Medicare Other | Admitting: Physician Assistant

## 2021-10-04 ENCOUNTER — Telehealth (INDEPENDENT_AMBULATORY_CARE_PROVIDER_SITE_OTHER): Payer: Self-pay

## 2021-10-04 ENCOUNTER — Encounter (INDEPENDENT_AMBULATORY_CARE_PROVIDER_SITE_OTHER): Payer: Self-pay

## 2021-10-04 ENCOUNTER — Inpatient Hospital Stay (HOSPITAL_BASED_OUTPATIENT_CLINIC_OR_DEPARTMENT_OTHER): Payer: Medicare Other | Admitting: Physician Assistant

## 2021-10-04 ENCOUNTER — Other Ambulatory Visit (INDEPENDENT_AMBULATORY_CARE_PROVIDER_SITE_OTHER): Payer: Self-pay

## 2021-10-04 VITALS — BP 126/55 | HR 78 | Temp 97.9°F | Resp 18

## 2021-10-04 VITALS — BP 128/63 | HR 80 | Temp 98.2°F | Resp 18 | Wt 198.6 lb

## 2021-10-04 DIAGNOSIS — Z87891 Personal history of nicotine dependence: Secondary | ICD-10-CM | POA: Diagnosis not present

## 2021-10-04 DIAGNOSIS — D500 Iron deficiency anemia secondary to blood loss (chronic): Secondary | ICD-10-CM

## 2021-10-04 DIAGNOSIS — D508 Other iron deficiency anemias: Secondary | ICD-10-CM | POA: Diagnosis not present

## 2021-10-04 DIAGNOSIS — I10 Essential (primary) hypertension: Secondary | ICD-10-CM

## 2021-10-04 DIAGNOSIS — D631 Anemia in chronic kidney disease: Secondary | ICD-10-CM

## 2021-10-04 DIAGNOSIS — N1831 Chronic kidney disease, stage 3a: Secondary | ICD-10-CM | POA: Diagnosis not present

## 2021-10-04 DIAGNOSIS — Z79899 Other long term (current) drug therapy: Secondary | ICD-10-CM | POA: Diagnosis not present

## 2021-10-04 DIAGNOSIS — D509 Iron deficiency anemia, unspecified: Secondary | ICD-10-CM | POA: Diagnosis not present

## 2021-10-04 DIAGNOSIS — N183 Chronic kidney disease, stage 3 unspecified: Secondary | ICD-10-CM | POA: Diagnosis not present

## 2021-10-04 MED ORDER — PEG 3350-KCL-NA BICARB-NACL 420 G PO SOLR: 4000.0000 mL | ORAL | 0 refills | Status: DC

## 2021-10-04 MED ORDER — SODIUM CHLORIDE 0.9 % IV SOLN: Freq: Once | INTRAVENOUS | Status: AC

## 2021-10-04 MED ORDER — ACETAMINOPHEN 325 MG PO TABS
650.0000 mg | ORAL_TABLET | Freq: Once | ORAL | Status: AC
  Administered 2021-10-04: 650 mg via ORAL
  Filled 2021-10-04: qty 2

## 2021-10-04 MED ORDER — LORATADINE 10 MG PO TABS
10.0000 mg | ORAL_TABLET | Freq: Once | ORAL | Status: AC
  Administered 2021-10-04: 10 mg via ORAL
  Filled 2021-10-04: qty 1

## 2021-10-04 MED ORDER — SODIUM CHLORIDE 0.9 % IV SOLN
510.0000 mg | Freq: Once | INTRAVENOUS | Status: AC
  Administered 2021-10-04: 510 mg via INTRAVENOUS
  Filled 2021-10-04: qty 510

## 2021-10-04 NOTE — Progress Notes (Signed)
Chaplain engaged in an initial visit with Candice [name], learning of her Thanksgiving plans.  Chaplain offered presence, listening and support.     10/04/21 1100  Clinical Encounter Type  Visited With Patient  Visit Type Initial

## 2021-10-04 NOTE — Progress Notes (Signed)
Patient presents today for Feraheme infusion per providers order.  Vital signs WNL.  Patient states that she is fatigued and SOB.  Peripheral IV started and blood return noted pre and post infusion.

## 2021-10-04 NOTE — Patient Instructions (Signed)
[place] at [facility] Discharge Instructions  You were seen today by [name] for your history of iron deficiency anemia.  As we discussed, your blood and iron levels are relatively well today, but due to your persistent fatigue we will give you 1 dose of IV iron.  However, I do not believe that your breathing issues are coming from your low iron.  This may be some residual breathing issues from your recent upper respiratory infection/flu illness.  You may also need a referral from your primary care doctor to see a lung doctor (pulmonologist) to see if there is any underlying issues with your lungs.  You should also speak to your primary care doctor about other potential causes of your fatigue, since the severity of your fatigue does not quite align with your iron levels.  We will go ahead and give you 1 dose of iron today in the hopes that this will improve your symptoms, but your levels are too high to justify giving you 2 doses of iron at this time.  LABS: Return in 4 months for repeat labs, but if you experience any concerning symptoms before your next appointment, please do not hesitate to call and we will schedule you for labs sooner rather than later.  FOLLOW-UP APPOINTMENT: Office visit in 4 months, after labs   Thank you for choosing [place] at [facility] to provide your oncology and hematology care.  To afford each patient quality time with our provider, please arrive at least 15 minutes before your scheduled appointment time.   If you have a lab appointment with the [facility] please come in thru the Main Entrance and check in at the main information desk.  You need to re-schedule your appointment should you arrive 10 or more minutes late.  We strive to give you quality time with our providers, and arriving late affects you and other patients whose appointments are after yours.  Also, if you no show three or more  times for appointments you may be dismissed from the clinic at the providers discretion.     Again, thank you for choosing [facility].  Our hope is that these requests will decrease the amount of time that you wait before being seen by our physicians.       _____________________________________________________________  Should you have questions after your visit to [facility]: Lambert Rhonda W, please contact our office at [phone] and follow the prompts.  Our office hours are 8:00 a.m. and 4:30 p.m. Monday - Friday.  Please note that voicemails left after 4:00 p.m. may not be returned until the following business day.  We are closed weekends and major holidays.  You do have access to a nurse 24-7, just call the main number to the clinic [phone] and do not press any options, hold on the line and a nurse will answer the phone.    For prescription refill requests, have your pharmacy contact our office and allow 72 hours.    Due to Covid, you will need to wear a mask upon entering the hospital. If you do not have a mask, a mask will be given to you at the Main Entrance upon arrival. For doctor visits, patients may have 1 support person age 65 or older with them. For treatment visits, patients can not have anyone with them due to social distancing guidelines and our immunocompromised population.

## 2021-10-04 NOTE — Telephone Encounter (Signed)
Candice [name], CMA  

## 2021-10-05 ENCOUNTER — Encounter (INDEPENDENT_AMBULATORY_CARE_PROVIDER_SITE_OTHER): Payer: Self-pay

## 2021-10-12 ENCOUNTER — Encounter (HOSPITAL_COMMUNITY)
Admission: RE | Admit: 2021-10-12 | Discharge: 2021-10-12 | Disposition: A | Discharge status: Home / Self Care | Payer: Medicare Other | Source: Ambulatory Visit | Attending: Gastroenterology | Admitting: Gastroenterology

## 2021-10-12 ENCOUNTER — Encounter (HOSPITAL_COMMUNITY): Payer: Self-pay

## 2021-10-12 ENCOUNTER — Other Ambulatory Visit: Payer: Self-pay

## 2021-10-12 DIAGNOSIS — Z01818 Encounter for other preprocedural examination: Secondary | ICD-10-CM | POA: Diagnosis not present

## 2021-10-12 DIAGNOSIS — I10 Essential (primary) hypertension: Secondary | ICD-10-CM | POA: Insufficient documentation

## 2021-10-12 LAB — BASIC METABOLIC PANEL
Anion gap: 9 (ref 5–15)
BUN: 16 mg/dL (ref 8–23)
CO2: 25 mmol/L (ref 22–32)
Calcium: 9.7 mg/dL (ref 8.9–10.3)
Chloride: 103 mmol/L (ref 98–111)
Creatinine, Ser: 1.12 mg/dL — ABNORMAL HIGH (ref 0.44–1.00)
GFR, Estimated: 52 mL/min — ABNORMAL LOW (ref >60)
Glucose, Bld: 102 mg/dL — ABNORMAL HIGH (ref 70–99)
Potassium: 3.8 mmol/L (ref 3.5–5.1)
Sodium: 137 mmol/L (ref 135–145)

## 2021-10-12 NOTE — Patient Instructions (Signed)
[place]  10/12/2021     @PREFPERIOPPHARMACY @   Your procedure is scheduled on 10/14/2021.   Report to [facility] at  [place] AM.    Call this number if you have problems the morning of surgery:  [phone]   Remember:  Follow the diet and prep instructions given to you by the office.    Take these medicines the morning of surgery with A SIP OF WATER                     celexa, protonix.     Do not wear jewelry, make-up or nail polish.  Do not wear lotions, powders, or perfumes, or deodorant.  Do not shave 48 hours prior to surgery.  Men may shave face and neck.  Do not bring valuables to the hospital.  [facility] is not responsible for any belongings or valuables.  Contacts, dentures or bridgework may not be worn into surgery.  Leave your suitcase in the car.  After surgery it may be brought to your room.  For patients admitted to the hospital, discharge time will be determined by your treatment team.  Patients discharged the day of surgery will not be allowed to drive home and must have someone with them for 24 hours.    Special instructions:  DO NOT smoke tobacco or vape for 24 hours before your procedure.  Please read over the following fact sheets that you were given. Anesthesia Post-op Instructions and Care and Recovery After Surgery      Upper Endoscopy, Adult, Care After This sheet gives you information about how to care for yourself after your procedure. Your health care provider may also give you more specific instructions. If you have problems or questions, contact your health care provider. What can I expect after the procedure? After the procedure, it is common to have: A sore throat. Mild stomach pain or discomfort. Bloating. Nausea. Follow these instructions at home:  Follow instructions from your health care provider about what to eat or drink after your procedure. Return to your normal activities as told by your health care  provider. Ask your health care provider what activities are safe for you. Take over-the-counter and prescription medicines only as told by your health care provider. If you were given a sedative during the procedure, it can affect you for several hours. Do not drive or operate machinery until your health care provider says that it is safe. Keep all follow-up visits as told by your health care provider. This is important. Contact a health care provider if you have: A sore throat that lasts longer than one day. Trouble swallowing. Get help right away if: You vomit blood or your vomit looks like coffee grounds. You have: A fever. Bloody, black, or tarry stools. A severe sore throat or you cannot swallow. Difficulty breathing. Severe pain in your chest or abdomen. Summary After the procedure, it is common to have a sore throat, mild stomach discomfort, bloating, and nausea. If you were given a sedative during the procedure, it can affect you for several hours. Do not drive or operate machinery until your health care provider says that it is safe. Follow instructions from your health care provider about what to eat or drink after your procedure. Return to your normal activities as told by your health care provider. This information is not intended to replace advice given to you by your health care provider. Make sure  you discuss any questions you have with your health care provider. Document Revised: 09/05/2019 Document Reviewed: 04/01/2018 Elsevier Patient Education  2022 [place]. Colonoscopy, Adult, Care After This sheet gives you information about how to care for yourself after your procedure. Your health care provider may also give you more specific instructions. If you have problems or questions, contact your health care provider. What can I expect after the procedure? After the procedure, it is common to have: A small amount of blood in your stool for 24 hours after the  procedure. Some gas. Mild cramping or bloating of your abdomen. Follow these instructions at home: Eating and drinking  Drink enough fluid to keep your urine pale yellow. Follow instructions from your health care provider about eating or drinking restrictions. Resume your normal diet as instructed by your health care provider. Avoid heavy or fried foods that are hard to digest. Activity Rest as told by your health care provider. Avoid sitting for a long time without moving. Get up to take short walks every 1-2 hours. This is important to improve blood flow and breathing. Ask for help if you feel weak or unsteady. Return to your normal activities as told by your health care provider. Ask your health care provider what activities are safe for you. Managing cramping and bloating  Try walking around when you have cramps or feel bloated. Apply heat to your abdomen as told by your health care provider. Use the heat source that your health care provider recommends, such as a moist heat pack or a heating pad. Place a towel between your skin and the heat source. Leave the heat on for 20-30 minutes. Remove the heat if your skin turns bright red. This is especially important if you are unable to feel pain, heat, or cold. You may have a greater risk of getting burned. General instructions If you were given a sedative during the procedure, it can affect you for several hours. Do not drive or operate machinery until your health care provider says that it is safe. For the first 24 hours after the procedure: Do not sign important documents. Do not drink alcohol. Do your regular daily activities at a slower pace than normal. Eat soft foods that are easy to digest. Take over-the-counter and prescription medicines only as told by your health care provider. Keep all follow-up visits as told by your health care provider. This is important. Contact a health care provider if: You have blood in your stool 2-3  days after the procedure. Get help right away if you have: More than a small spotting of blood in your stool. Large blood clots in your stool. Swelling of your abdomen. Nausea or vomiting. A fever. Increasing pain in your abdomen that is not relieved with medicine. Summary After the procedure, it is common to have a small amount of blood in your stool. You may also have mild cramping and bloating of your abdomen. If you were given a sedative during the procedure, it can affect you for several hours. Do not drive or operate machinery until your health care provider says that it is safe. Get help right away if you have a lot of blood in your stool, nausea or vomiting, a fever, or increased pain in your abdomen. This information is not intended to replace advice given to you by your health care provider. Make sure you discuss any questions you have with your health care provider. Document Revised: 09/05/2019 Document Reviewed: 05/26/2019 Elsevier Patient Education  2022  [place] After This sheet gives you information about how to care for yourself after your procedure. Your health care provider may also give you more specific instructions. If you have problems or questions, contact your health care provider. What can I expect after the procedure? After the procedure, it is common to have: Tiredness. Forgetfulness about what happened after the procedure. Impaired judgment for important decisions. Nausea or vomiting. Some difficulty with balance. Follow these instructions at home: For the time period you were told by your health care provider:   Rest as needed. Do not participate in activities where you could fall or become injured. Do not drive or use machinery. Do not drink alcohol. Do not take sleeping pills or medicines that cause drowsiness. Do not make important decisions or sign legal documents. Do not take care of children on your own. Eating  and drinking Follow the diet that is recommended by your health care provider. Drink enough fluid to keep your urine pale yellow. If you vomit: Drink water, juice, or soup when you can drink without vomiting. Make sure you have little or no nausea before eating solid foods. General instructions Have a responsible adult stay with you for the time you are told. It is important to have someone help care for you until you are awake and alert. Take over-the-counter and prescription medicines only as told by your health care provider. If you have sleep apnea, surgery and certain medicines can increase your risk for breathing problems. Follow instructions from your health care provider about wearing your sleep device: Anytime you are sleeping, including during daytime naps. While taking prescription pain medicines, sleeping medicines, or medicines that make you drowsy. Avoid smoking. Keep all follow-up visits as told by your health care provider. This is important. Contact a health care provider if: You keep feeling nauseous or you keep vomiting. You feel light-headed. You are still sleepy or having trouble with balance after 24 hours. You develop a rash. You have a fever. You have redness or swelling around the IV site. Get help right away if: You have trouble breathing. You have new-onset confusion at home. Summary For several hours after your procedure, you may feel tired. You may also be forgetful and have poor judgment. Have a responsible adult stay with you for the time you are told. It is important to have someone help care for you until you are awake and alert. Rest as told. Do not drive or operate machinery. Do not drink alcohol or take sleeping pills. Get help right away if you have trouble breathing, or if you suddenly become confused. This information is not intended to replace advice given to you by your health care provider. Make sure you discuss any questions you have with your  health care provider. Document Revised: 07/15/2020 Document Reviewed: 10/02/2019 Elsevier Patient Education  2022 [employer].

## 2021-10-14 ENCOUNTER — Ambulatory Visit (HOSPITAL_COMMUNITY): Payer: Medicare Other | Admitting: Anesthesiology

## 2021-10-14 ENCOUNTER — Other Ambulatory Visit: Payer: Self-pay

## 2021-10-14 ENCOUNTER — Encounter (HOSPITAL_COMMUNITY): Payer: Self-pay | Admitting: Gastroenterology

## 2021-10-14 ENCOUNTER — Encounter (HOSPITAL_COMMUNITY)
Admission: RE | Disposition: A | Discharge status: Home / Self Care | Payer: Self-pay | Source: Home / Self Care | Attending: Gastroenterology

## 2021-10-14 ENCOUNTER — Ambulatory Visit (HOSPITAL_COMMUNITY)
Admission: RE | Admit: 2021-10-14 | Discharge: 2021-10-14 | Disposition: A | Discharge status: Home / Self Care | Payer: Medicare Other | Attending: Gastroenterology | Admitting: Gastroenterology

## 2021-10-14 DIAGNOSIS — J45909 Unspecified asthma, uncomplicated: Secondary | ICD-10-CM | POA: Insufficient documentation

## 2021-10-14 DIAGNOSIS — N189 Chronic kidney disease, unspecified: Secondary | ICD-10-CM | POA: Diagnosis not present

## 2021-10-14 DIAGNOSIS — K621 Rectal polyp: Secondary | ICD-10-CM | POA: Diagnosis not present

## 2021-10-14 DIAGNOSIS — K635 Polyp of colon: Secondary | ICD-10-CM | POA: Insufficient documentation

## 2021-10-14 DIAGNOSIS — Z87891 Personal history of nicotine dependence: Secondary | ICD-10-CM | POA: Insufficient documentation

## 2021-10-14 DIAGNOSIS — K449 Diaphragmatic hernia without obstruction or gangrene: Secondary | ICD-10-CM | POA: Diagnosis not present

## 2021-10-14 DIAGNOSIS — K219 Gastro-esophageal reflux disease without esophagitis: Secondary | ICD-10-CM | POA: Insufficient documentation

## 2021-10-14 DIAGNOSIS — Z79899 Other long term (current) drug therapy: Secondary | ICD-10-CM | POA: Diagnosis not present

## 2021-10-14 DIAGNOSIS — K5520 Angiodysplasia of colon without hemorrhage: Secondary | ICD-10-CM | POA: Diagnosis not present

## 2021-10-14 DIAGNOSIS — I129 Hypertensive chronic kidney disease with stage 1 through stage 4 chronic kidney disease, or unspecified chronic kidney disease: Secondary | ICD-10-CM | POA: Insufficient documentation

## 2021-10-14 DIAGNOSIS — F32A Depression, unspecified: Secondary | ICD-10-CM | POA: Insufficient documentation

## 2021-10-14 DIAGNOSIS — D122 Benign neoplasm of ascending colon: Secondary | ICD-10-CM | POA: Insufficient documentation

## 2021-10-14 DIAGNOSIS — F419 Anxiety disorder, unspecified: Secondary | ICD-10-CM | POA: Insufficient documentation

## 2021-10-14 DIAGNOSIS — K5730 Diverticulosis of large intestine without perforation or abscess without bleeding: Secondary | ICD-10-CM | POA: Diagnosis not present

## 2021-10-14 DIAGNOSIS — K5140 Inflammatory polyps of colon without complications: Secondary | ICD-10-CM | POA: Diagnosis not present

## 2021-10-14 DIAGNOSIS — K31819 Angiodysplasia of stomach and duodenum without bleeding: Secondary | ICD-10-CM

## 2021-10-14 DIAGNOSIS — K259 Gastric ulcer, unspecified as acute or chronic, without hemorrhage or perforation: Secondary | ICD-10-CM | POA: Diagnosis not present

## 2021-10-14 DIAGNOSIS — K648 Other hemorrhoids: Secondary | ICD-10-CM | POA: Insufficient documentation

## 2021-10-14 DIAGNOSIS — D127 Benign neoplasm of rectosigmoid junction: Secondary | ICD-10-CM | POA: Diagnosis not present

## 2021-10-14 DIAGNOSIS — D509 Iron deficiency anemia, unspecified: Secondary | ICD-10-CM | POA: Diagnosis not present

## 2021-10-14 HISTORY — PX: COLONOSCOPY WITH PROPOFOL: SHX5780

## 2021-10-14 HISTORY — PX: POLYPECTOMY: SHX5525

## 2021-10-14 HISTORY — PX: ESOPHAGOGASTRODUODENOSCOPY (EGD) WITH PROPOFOL: SHX5813

## 2021-10-14 HISTORY — PX: HOT HEMOSTASIS: SHX5433

## 2021-10-14 LAB — HM COLONOSCOPY

## 2021-10-14 SURGERY — COLONOSCOPY WITH PROPOFOL
Anesthesia: General

## 2021-10-14 MED ORDER — PROPOFOL 500 MG/50ML IV EMUL
INTRAVENOUS | Status: AC
  Filled 2021-10-14: qty 50

## 2021-10-14 MED ORDER — EPHEDRINE SULFATE 50 MG/ML IJ SOLN
INTRAMUSCULAR | Status: DC | PRN
  Administered 2021-10-14: 10 mg via INTRAVENOUS
  Administered 2021-10-14: 5 mg via INTRAVENOUS
  Administered 2021-10-14: 10 mg via INTRAVENOUS

## 2021-10-14 MED ORDER — PROPOFOL 10 MG/ML IV BOLUS
INTRAVENOUS | Status: AC
  Filled 2021-10-14: qty 60

## 2021-10-14 MED ORDER — PHENYLEPHRINE 40 MCG/ML (10ML) SYRINGE FOR IV PUSH (FOR BLOOD PRESSURE SUPPORT)
PREFILLED_SYRINGE | INTRAVENOUS | Status: AC
  Filled 2021-10-14: qty 10

## 2021-10-14 MED ORDER — LIDOCAINE HCL (CARDIAC) PF 100 MG/5ML IV SOSY
PREFILLED_SYRINGE | INTRAVENOUS | Status: DC | PRN
  Administered 2021-10-14: 50 mg via INTRAVENOUS

## 2021-10-14 MED ORDER — PROPOFOL 500 MG/50ML IV EMUL
INTRAVENOUS | Status: DC | PRN
  Administered 2021-10-14: 150 ug/kg/min via INTRAVENOUS

## 2021-10-14 MED ORDER — PANTOPRAZOLE SODIUM 40 MG PO TBEC: 40.0000 mg | DELAYED_RELEASE_TABLET | Freq: Two times a day (BID) | ORAL | 0 refills | Status: DC

## 2021-10-14 MED ORDER — GLUCAGON HCL RDNA (DIAGNOSTIC) 1 MG IJ SOLR
INTRAMUSCULAR | Status: DC | PRN
  Administered 2021-10-14: .5 mg via INTRAVENOUS

## 2021-10-14 MED ORDER — LACTATED RINGERS IV SOLN: INTRAVENOUS | Status: DC | PRN

## 2021-10-14 MED ORDER — PHENYLEPHRINE HCL (PRESSORS) 10 MG/ML IV SOLN
INTRAVENOUS | Status: DC | PRN
  Administered 2021-10-14 (×4): 100 ug via INTRAVENOUS
  Administered 2021-10-14: 200 ug via INTRAVENOUS
  Administered 2021-10-14 (×6): 100 ug via INTRAVENOUS

## 2021-10-14 MED ORDER — GLUCAGON HCL RDNA (DIAGNOSTIC) 1 MG IJ SOLR
INTRAMUSCULAR | Status: AC
  Filled 2021-10-14: qty 1

## 2021-10-14 MED ORDER — PROPOFOL 10 MG/ML IV BOLUS
INTRAVENOUS | Status: DC | PRN
  Administered 2021-10-14: 50 mg via INTRAVENOUS

## 2021-10-14 MED ORDER — LIDOCAINE HCL (PF) 2 % IJ SOLN
INTRAMUSCULAR | Status: AC
  Filled 2021-10-14: qty 5

## 2021-10-14 NOTE — Op Note (Signed)
[facility] Patient Name: Candice [name] Procedure Date: 10/14/2021 10:38 AM MRN: [id number] Date of Birth: 18-Sep-1951 Attending MD: Maylon Peppers ,  CSN: [id number] Age: 70 Admit Type: Outpatient Procedure:                Colonoscopy Indications:              Iron deficiency anemia Providers:                Maylon Peppers, Janeece Riggers, RN, Raphael Gibney,                            Technician Referring MD:              Medicines:                Monitored Anesthesia Care Complications:            No immediate complications. Estimated Blood Loss:     Estimated blood loss: none. Procedure:                Pre-Anesthesia Assessment:                           - Prior to the procedure, a History and Physical                            was performed, and patient medications, allergies                            and sensitivities were reviewed. The patient's                            tolerance of previous anesthesia was reviewed.                           - The risks and benefits of the procedure and the                            sedation options and risks were discussed with the                            patient. All questions were answered and informed                            consent was obtained.                           - ASA Grade Assessment: III - A patient with severe                            systemic disease.                           After obtaining informed consent, the colonoscope                            was passed under direct vision. Throughout the  procedure, the patient's blood pressure, pulse, and                            oxygen saturations were monitored continuously. The                            [id number]-HQ190L ([id number]) scope was introduced through                            the anus and advanced to the 15 cm into the ileum.                            The colonoscopy was performed without difficulty.                             The patient tolerated the procedure well. The                            quality of the bowel preparation was excellent. Scope In: 11:14:57 AM Scope Out: 11:37:17 AM Scope Withdrawal Time: 0 hours 18 minutes 4 seconds  Total Procedure Duration: 0 hours 22 minutes 20 seconds  Findings:      The perianal and digital rectal examinations were normal.      Three flat and sessile polyps were found in the rectum, sigmoid colon       and ascending colon. The polyps were 4 to 8 mm in size.      Multiple small-mouthed diverticula were found in the sigmoid colon.      Non-bleeding internal hemorrhoids were found during retroflexion. The       hemorrhoids were small. Impression:               - Three 4 to 8 mm polyps in the rectum, in the                            sigmoid colon and in the ascending colon.                           - Diverticulosis in the sigmoid colon.                           - Non-bleeding internal hemorrhoids.                           - No specimens collected. Moderate Sedation:      Per Anesthesia Care Recommendation:           - Discharge patient to home (ambulatory).                           - Resume previous diet.                           - Await pathology results.                           - Repeat colonoscopy date to be determined after  pending pathology results are reviewed for                            surveillance.                           - Can take Miralax daily and Preparation H for                            hemorrhoidal bleeding. Procedure Code(s):        --- Professional ---                           [phone], Colonoscopy, flexible; diagnostic, including                            collection of specimen(s) by brushing or washing,                            when performed (separate procedure) Diagnosis Code(s):        --- Professional ---                           K62.1, Rectal polyp                           K63.5, Polyp of  colon                           K64.8, Other hemorrhoids                           D50.9, Iron deficiency anemia, unspecified                           K57.30, Diverticulosis of large intestine without                            perforation or abscess without bleeding CPT copyright 2019 American Medical Association. All rights reserved. The codes documented in this report are preliminary and upon coder review may  be revised to meet current compliance requirements. Maylon Peppers, MD Maylon Peppers,  10/14/2021 11:49:43 AM This report has been signed electronically. Number of Addenda: 0

## 2021-10-14 NOTE — Interval H&P Note (Signed)
History and Physical Interval Note:  10/14/2021 10:04 AM  Candice [name] [name]  has presented today for surgery, with the diagnosis of Iron Deficiency Anemia.  The various methods of treatment have been discussed with the patient and family. After consideration of risks, benefits and other options for treatment, the patient has consented to  Procedure(s) with comments: COLONOSCOPY WITH PROPOFOL (N/A) - 10:50 ESOPHAGOGASTRODUODENOSCOPY (EGD) WITH PROPOFOL (N/A) as a surgical intervention.  The patient's history has been reviewed, patient examined, no change in status, stable for surgery.  I have reviewed the patient's chart and labs.  Questions were answered to the patient's satisfaction.     [name]

## 2021-10-14 NOTE — Op Note (Signed)
[facility] Patient Name: Candice [name] Procedure Date: 10/14/2021 10:37 AM MRN: [id number] Date of Birth: [birth year] Attending MD: Maylon Peppers ,  CSN: [id number] Age: 70 Admit Type: Outpatient Procedure:                Upper GI endoscopy Indications:              Iron deficiency anemia Providers:                Maylon Peppers, Janeece Riggers, RN, Raphael Gibney,                            Technician Referring MD:              Medicines:                Monitored Anesthesia Care Complications:            No immediate complications. Estimated Blood Loss:     Estimated blood loss: none. Procedure:                Pre-Anesthesia Assessment:                           - Prior to the procedure, a History and Physical                            was performed, and patient medications, allergies                            and sensitivities were reviewed. The patient's                            tolerance of previous anesthesia was reviewed.                           - The risks and benefits of the procedure and the                            sedation options and risks were discussed with the                            patient. All questions were answered and informed                            consent was obtained.                           - ASA Grade Assessment: III - A patient with severe                            systemic disease.                           After obtaining informed consent, the endoscope was                            passed under direct vision. Throughout the  procedure, the patient's blood pressure, pulse, and                            oxygen saturations were monitored continuously. The                            [id number]-[id number] ([id number]) scope was introduced through the                            mouth, and advanced to the third part of duodenum.                            The upper GI endoscopy was accomplished without                             difficulty. The patient tolerated the procedure                            well. Scope In: 10:49:04 AM Scope Out: 11:07:45 AM Total Procedure Duration: 0 hours 18 minutes 41 seconds  Findings:      The examined esophagus was normal.      A large hiatal hernia with a few Cameron ulcers was found. This was a       paraesophageal hernia and caused significant deformation of the stomach.       The hiatal narrowing was 40 cm from the incisors. The Z-line was 29 cm       from the incisors.      A single small angiodysplastic lesion with no bleeding was found in the       gastric body. Coagulation for bleeding prevention using argon plasma at       0.3 liters/minute and 20 watts was successful.      Five small angiodysplastic lesions without bleeding were found in the       first portion of the duodenum, in the second portion of the duodenum and       in the third portion of the duodenum. Coagulation for bleeding       prevention using argon plasma at 0.3 liters/minute and 20 watts was       successful.      Irn deficiency anemia likely a combination of AVMs and Cameron erosions Impression:               - Normal esophagus.                           - Large hiatal hernia with a few Cameron ulcers.                           - A single non-bleeding angiodysplastic lesion in                            the stomach. Treated with argon plasma coagulation                            (APC).                           -  Five non-bleeding angiodysplastic lesions in the                            duodenum. Treated with argon plasma coagulation                            (APC).                           - No specimens collected. Moderate Sedation:      Per Anesthesia Care Recommendation:           - Discharge patient to home (ambulatory).                           - Resume previous diet.                           - Await pathology results.                           - Increase pantoprazole to twice  a day dosing for 3                            months, can decrease to once a month if hemoglobin                            is above 12.                           - Continue IV iron.                           - If recurrent anemia after decreasing pantoprazole                            once a day, may need to be on twice a day dosing                            indefinitely.                           - May need capsule endoscopy if worsening anemia -                            will need endoscopic deploymeent.                           - If persistent anemia despite max PPI therapy and                            no more AVMs are seen, may need surgical referral                            for hernia correction. Procedure Code(s):        --- Professional ---  43255, Esophagogastroduodenoscopy, flexible,                            transoral; with control of bleeding, any method Diagnosis Code(s):        --- Professional ---                           K44.9, Diaphragmatic hernia without obstruction or                            gangrene                           K25.9, Gastric ulcer, unspecified as acute or                            chronic, without hemorrhage or perforation                           K31.819, Angiodysplasia of stomach and duodenum                            without bleeding                           D50.9, Iron deficiency anemia, unspecified CPT copyright 2019 American Medical Association. All rights reserved. The codes documented in this report are preliminary and upon coder review may  be revised to meet current compliance requirements. Maylon Peppers, MD Maylon Peppers,  10/14/2021 11:45:46 AM This report has been signed electronically. Number of Addenda: 0

## 2021-10-14 NOTE — Transfer of Care (Signed)
Immediate Anesthesia Transfer of Care Note  Patient: Candice [name]  Procedure(s) Performed: COLONOSCOPY WITH PROPOFOL ESOPHAGOGASTRODUODENOSCOPY (EGD) WITH PROPOFOL HOT HEMOSTASIS (ARGON PLASMA COAGULATION/BICAP) POLYPECTOMY  Patient Location: PACU  Anesthesia Type:General  Level of Consciousness: awake, alert , oriented and patient cooperative  Airway & Oxygen Therapy: Patient Spontanous Breathing  Post-op Assessment: Report given to RN, Post -op Vital signs reviewed and stable and Patient moving all extremities X 4  Post vital signs: Reviewed and stable  Last Vitals:  Vitals Value Taken Time  BP 95/28 10/14/21 1144  Temp    Pulse 96 10/14/21 1144  Resp 19 10/14/21 1144  SpO2 99 % 10/14/21 1144    Last Pain:  Vitals:   10/14/21 1144  TempSrc:   PainSc: 0-No pain         Complications: No notable events documented.

## 2021-10-14 NOTE — Discharge Instructions (Signed)
You are being discharged to home.  Resume your previous diet.  We are waiting for your pathology results.  Increase pantoprazole to twice a day dosing for 3 months, can decrease to once a month if hemoglobin is above 12. You are being discharged to home.  Resume your previous diet.  We are waiting for your pathology results.  Your physician has recommended a repeat colonoscopy (date to be determined after pending pathology results are reviewed) for surveillance.  Can take Miralax daily and Preparation H for hemorrhoidal bleeding.

## 2021-10-14 NOTE — Anesthesia Preprocedure Evaluation (Signed)
Anesthesia Evaluation  Patient identified by MRN, date of birth, ID band Patient awake    Reviewed: Allergy & Precautions, H&P , NPO status , Patient's Chart, lab work & pertinent test results, reviewed documented beta blocker date and time   Airway Mallampati: II  TM Distance: >3 FB Neck ROM: full    Dental no notable dental hx.    Pulmonary shortness of breath, asthma , former smoker,    Pulmonary exam normal breath sounds clear to auscultation       Cardiovascular Exercise Tolerance: Good hypertension, negative cardio ROS   Rhythm:regular Rate:Normal     Neuro/Psych PSYCHIATRIC DISORDERS Anxiety Depression  Neuromuscular disease    GI/Hepatic Neg liver ROS, GERD  Medicated,  Endo/Other  negative endocrine ROS  Renal/GU negative Renal ROS  negative genitourinary   Musculoskeletal   Abdominal   Peds  Hematology  (+) Blood dyscrasia, anemia ,   Anesthesia Other Findings   Reproductive/Obstetrics negative OB ROS                             Anesthesia Physical Anesthesia Plan  ASA: 3  Anesthesia Plan: General   Post-op Pain Management:    Induction:   PONV Risk Score and Plan: Propofol infusion  Airway Management Planned:   Additional Equipment:   Intra-op Plan:   Post-operative Plan:   Informed Consent: I have reviewed the patients History and Physical, chart, labs and discussed the procedure including the risks, benefits and alternatives for the proposed anesthesia with the patient or authorized representative who has indicated his/her understanding and acceptance.     Dental Advisory Given  Plan Discussed with: CRNA  Anesthesia Plan Comments:         Anesthesia Quick Evaluation

## 2021-10-14 NOTE — Anesthesia Postprocedure Evaluation (Signed)
Anesthesia Post Note  Patient: MAEBEL MARASCO  Procedure(s) Performed: COLONOSCOPY WITH PROPOFOL ESOPHAGOGASTRODUODENOSCOPY (EGD) WITH PROPOFOL HOT HEMOSTASIS (ARGON PLASMA COAGULATION/BICAP) POLYPECTOMY  Patient location during evaluation: Phase II Anesthesia Type: General Level of consciousness: awake Pain management: pain level controlled Vital Signs Assessment: post-procedure vital signs reviewed and stable Respiratory status: spontaneous breathing and respiratory function stable Cardiovascular status: blood pressure returned to baseline and stable Postop Assessment: no headache and no apparent nausea or vomiting Anesthetic complications: no Comments: Late entry   No notable events documented.   Last Vitals:  Vitals:   10/14/21 1144 10/14/21 1150  BP: (!) 95/28 111/70  Pulse: 96 89  Resp: 19 17  Temp:    SpO2: 99% 97%    Last Pain:  Vitals:   10/14/21 1144  TempSrc:   PainSc: 0-No pain                 Louann Sjogren

## 2021-10-17 ENCOUNTER — Encounter (INDEPENDENT_AMBULATORY_CARE_PROVIDER_SITE_OTHER): Payer: Self-pay | Admitting: *Deleted

## 2021-10-17 LAB — SURGICAL PATHOLOGY

## 2021-10-19 NOTE — Progress Notes (Signed)
I apologize for this mistake. The three polyps were removed with a cold snare and the tissue was retrieved Thanks

## 2021-10-20 ENCOUNTER — Encounter (HOSPITAL_COMMUNITY): Payer: Self-pay | Admitting: Gastroenterology

## 2021-11-03 ENCOUNTER — Telehealth (INDEPENDENT_AMBULATORY_CARE_PROVIDER_SITE_OTHER): Payer: Self-pay

## 2021-11-03 NOTE — Telephone Encounter (Signed)
Patient called today wanting to know why she was placed on Pantoprazole bid. I told her that the doctor wanted her to try to stay on it bid for a month and if Hgb is above 12 after the one month she can return to taking it once per day. Even after explaining to the patient she is adamant that the pantoprazole causes bleeding and she does not want to take it. She states she did her research and read the enclosed pamphlet that came with the medication and it stated it can cause bleeding. She would like to know if there is something else she can take in place of this. Please advise.   EGD RESULTS 10/14/2021 :Increase pantoprazole to twice a day dosing for 3 months, can decrease to once a month if hemoglobin is above 12. - Continue IV iron. - If recurrent anemia after decreasing pantoprazole once a day, may need to be on twice a day dosing indefinitely. - May need capsule endoscopy if worsening anemia - will need endoscopic deploymeent. -

## 2021-11-03 NOTE — Telephone Encounter (Signed)
Spoke to the patient about her diagnosis, discussed with patient Candice [name] ulcers, hiatal hernia and AVMs, and  need to be on PPI BID for 3 months and then decrease to once a day dosing.

## 2021-12-02 DIAGNOSIS — E6609 Other obesity due to excess calories: Secondary | ICD-10-CM | POA: Diagnosis not present

## 2021-12-02 DIAGNOSIS — I129 Hypertensive chronic kidney disease with stage 1 through stage 4 chronic kidney disease, or unspecified chronic kidney disease: Secondary | ICD-10-CM | POA: Diagnosis not present

## 2021-12-02 DIAGNOSIS — N189 Chronic kidney disease, unspecified: Secondary | ICD-10-CM | POA: Diagnosis not present

## 2021-12-02 DIAGNOSIS — E1122 Type 2 diabetes mellitus with diabetic chronic kidney disease: Secondary | ICD-10-CM | POA: Diagnosis not present

## 2021-12-02 DIAGNOSIS — D472 Monoclonal gammopathy: Secondary | ICD-10-CM | POA: Diagnosis not present

## 2021-12-02 DIAGNOSIS — D638 Anemia in other chronic diseases classified elsewhere: Secondary | ICD-10-CM | POA: Diagnosis not present

## 2021-12-09 DIAGNOSIS — E6609 Other obesity due to excess calories: Secondary | ICD-10-CM | POA: Diagnosis not present

## 2021-12-09 DIAGNOSIS — I129 Hypertensive chronic kidney disease with stage 1 through stage 4 chronic kidney disease, or unspecified chronic kidney disease: Secondary | ICD-10-CM | POA: Diagnosis not present

## 2021-12-09 DIAGNOSIS — D638 Anemia in other chronic diseases classified elsewhere: Secondary | ICD-10-CM | POA: Diagnosis not present

## 2021-12-09 DIAGNOSIS — N189 Chronic kidney disease, unspecified: Secondary | ICD-10-CM | POA: Diagnosis not present

## 2021-12-09 DIAGNOSIS — E1122 Type 2 diabetes mellitus with diabetic chronic kidney disease: Secondary | ICD-10-CM | POA: Diagnosis not present

## 2021-12-11 DIAGNOSIS — I10 Essential (primary) hypertension: Secondary | ICD-10-CM | POA: Diagnosis not present

## 2021-12-11 DIAGNOSIS — E7849 Other hyperlipidemia: Secondary | ICD-10-CM | POA: Diagnosis not present

## 2021-12-14 DIAGNOSIS — Z20828 Contact with and (suspected) exposure to other viral communicable diseases: Secondary | ICD-10-CM | POA: Diagnosis not present

## 2022-01-02 DIAGNOSIS — R5383 Other fatigue: Secondary | ICD-10-CM | POA: Diagnosis not present

## 2022-01-02 DIAGNOSIS — M25561 Pain in right knee: Secondary | ICD-10-CM | POA: Diagnosis not present

## 2022-01-02 DIAGNOSIS — M79604 Pain in right leg: Secondary | ICD-10-CM | POA: Diagnosis not present

## 2022-01-02 DIAGNOSIS — Z6834 Body mass index (BMI) 34.0-34.9, adult: Secondary | ICD-10-CM | POA: Diagnosis not present

## 2022-01-12 DIAGNOSIS — M25561 Pain in right knee: Secondary | ICD-10-CM | POA: Diagnosis not present

## 2022-01-12 DIAGNOSIS — M25562 Pain in left knee: Secondary | ICD-10-CM | POA: Diagnosis not present

## 2022-01-12 DIAGNOSIS — M79604 Pain in right leg: Secondary | ICD-10-CM | POA: Diagnosis not present

## 2022-01-13 DIAGNOSIS — Z20822 Contact with and (suspected) exposure to covid-19: Secondary | ICD-10-CM | POA: Diagnosis not present

## 2022-01-18 DIAGNOSIS — M5416 Radiculopathy, lumbar region: Secondary | ICD-10-CM | POA: Diagnosis not present

## 2022-01-18 DIAGNOSIS — R262 Difficulty in walking, not elsewhere classified: Secondary | ICD-10-CM | POA: Diagnosis not present

## 2022-01-18 DIAGNOSIS — M79604 Pain in right leg: Secondary | ICD-10-CM | POA: Diagnosis not present

## 2022-01-20 DIAGNOSIS — M5416 Radiculopathy, lumbar region: Secondary | ICD-10-CM | POA: Diagnosis not present

## 2022-01-20 DIAGNOSIS — M79604 Pain in right leg: Secondary | ICD-10-CM | POA: Diagnosis not present

## 2022-01-20 DIAGNOSIS — R262 Difficulty in walking, not elsewhere classified: Secondary | ICD-10-CM | POA: Diagnosis not present

## 2022-01-24 ENCOUNTER — Inpatient Hospital Stay (HOSPITAL_COMMUNITY): Payer: Medicare Other | Attending: Hematology

## 2022-01-24 DIAGNOSIS — N183 Chronic kidney disease, stage 3 unspecified: Secondary | ICD-10-CM | POA: Diagnosis not present

## 2022-01-24 DIAGNOSIS — N1831 Chronic kidney disease, stage 3a: Secondary | ICD-10-CM

## 2022-01-24 DIAGNOSIS — D631 Anemia in chronic kidney disease: Secondary | ICD-10-CM | POA: Insufficient documentation

## 2022-01-24 DIAGNOSIS — D508 Other iron deficiency anemias: Secondary | ICD-10-CM

## 2022-01-24 DIAGNOSIS — D509 Iron deficiency anemia, unspecified: Secondary | ICD-10-CM | POA: Diagnosis not present

## 2022-01-24 DIAGNOSIS — I129 Hypertensive chronic kidney disease with stage 1 through stage 4 chronic kidney disease, or unspecified chronic kidney disease: Secondary | ICD-10-CM | POA: Insufficient documentation

## 2022-01-24 LAB — CBC WITH DIFFERENTIAL/PLATELET
Abs Immature Granulocytes: 0.05 10*3/uL (ref 0.00–0.07)
Basophils Absolute: 0 10*3/uL (ref 0.0–0.1)
Basophils Relative: 1 %
Eosinophils Absolute: 0.3 10*3/uL (ref 0.0–0.5)
Eosinophils Relative: 4 %
HCT: 37.2 % (ref 36.0–46.0)
Hemoglobin: 11.8 g/dL — ABNORMAL LOW (ref 12.0–15.0)
Immature Granulocytes: 1 %
Lymphocytes Relative: 17 %
Lymphs Abs: 1.3 10*3/uL (ref 0.7–4.0)
MCH: 31.2 pg (ref 26.0–34.0)
MCHC: 31.7 g/dL (ref 30.0–36.0)
MCV: 98.4 fL (ref 80.0–100.0)
Monocytes Absolute: 0.5 10*3/uL (ref 0.1–1.0)
Monocytes Relative: 7 %
Neutro Abs: 5.8 10*3/uL (ref 1.7–7.7)
Neutrophils Relative %: 70 %
Platelets: 292 10*3/uL (ref 150–400)
RBC: 3.78 MIL/uL — ABNORMAL LOW (ref 3.87–5.11)
RDW: 15.5 % (ref 11.5–15.5)
WBC: 8 10*3/uL (ref 4.0–10.5)
nRBC: 0 % (ref 0.0–0.2)

## 2022-01-24 LAB — COMPREHENSIVE METABOLIC PANEL
ALT: 14 U/L (ref 0–44)
AST: 20 U/L (ref 15–41)
Albumin: 3.8 g/dL (ref 3.5–5.0)
Alkaline Phosphatase: 68 U/L (ref 38–126)
Anion gap: 11 (ref 5–15)
BUN: 22 mg/dL (ref 8–23)
CO2: 24 mmol/L (ref 22–32)
Calcium: 9.5 mg/dL (ref 8.9–10.3)
Chloride: 101 mmol/L (ref 98–111)
Creatinine, Ser: 1.14 mg/dL — ABNORMAL HIGH (ref 0.44–1.00)
GFR, Estimated: 51 mL/min — ABNORMAL LOW (ref >60)
Glucose, Bld: 135 mg/dL — ABNORMAL HIGH (ref 70–99)
Potassium: 3.6 mmol/L (ref 3.5–5.1)
Sodium: 136 mmol/L (ref 135–145)
Total Bilirubin: 0.5 mg/dL (ref 0.3–1.2)
Total Protein: 7 g/dL (ref 6.5–8.1)

## 2022-01-24 LAB — IRON AND TIBC
Iron: 60 ug/dL (ref 28–170)
Saturation Ratios: 16 % (ref 10.4–31.8)
TIBC: 375 ug/dL (ref 250–450)
UIBC: 315 ug/dL

## 2022-01-24 LAB — FERRITIN: Ferritin: 22 ng/mL (ref 11–307)

## 2022-01-24 LAB — LACTATE DEHYDROGENASE: LDH: 189 U/L (ref 98–192)

## 2022-01-25 ENCOUNTER — Telehealth (HOSPITAL_COMMUNITY): Payer: Self-pay | Admitting: *Deleted

## 2022-01-25 ENCOUNTER — Other Ambulatory Visit (HOSPITAL_COMMUNITY): Payer: Self-pay | Admitting: Physician Assistant

## 2022-01-25 DIAGNOSIS — R262 Difficulty in walking, not elsewhere classified: Secondary | ICD-10-CM | POA: Diagnosis not present

## 2022-01-25 DIAGNOSIS — M79604 Pain in right leg: Secondary | ICD-10-CM | POA: Diagnosis not present

## 2022-01-25 DIAGNOSIS — M5416 Radiculopathy, lumbar region: Secondary | ICD-10-CM | POA: Diagnosis not present

## 2022-01-25 NOTE — Telephone Encounter (Signed)
Patient called stating that she feels like she needs to have iron infusion, judging by the way she feels.  States that she is weak, SOB and moderate to severe fatigue.  Labs reviewed by Tarri Abernethy - PAC and patient will be scheduled for iron infusions to start day of next follow up. ?

## 2022-01-25 NOTE — Progress Notes (Signed)
IV Feraheme x 2 ordered base don most recent labs.  ?Visit with patient next week. ?

## 2022-01-26 DIAGNOSIS — Z20828 Contact with and (suspected) exposure to other viral communicable diseases: Secondary | ICD-10-CM | POA: Diagnosis not present
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
## 2022-01-31 ENCOUNTER — Other Ambulatory Visit: Payer: Self-pay

## 2022-01-31 ENCOUNTER — Inpatient Hospital Stay (HOSPITAL_BASED_OUTPATIENT_CLINIC_OR_DEPARTMENT_OTHER): Payer: Medicare Other | Admitting: Physician Assistant

## 2022-01-31 ENCOUNTER — Encounter (HOSPITAL_COMMUNITY): Payer: Self-pay

## 2022-01-31 ENCOUNTER — Inpatient Hospital Stay (HOSPITAL_COMMUNITY): Payer: Medicare Other

## 2022-01-31 ENCOUNTER — Ambulatory Visit (HOSPITAL_COMMUNITY): Payer: Medicare Other | Admitting: Physician Assistant

## 2022-01-31 VITALS — BP 140/69 | HR 96 | Temp 96.0°F | Resp 18 | Ht 67.0 in | Wt 207.6 lb

## 2022-01-31 DIAGNOSIS — D500 Iron deficiency anemia secondary to blood loss (chronic): Secondary | ICD-10-CM | POA: Diagnosis not present

## 2022-01-31 DIAGNOSIS — D509 Iron deficiency anemia, unspecified: Secondary | ICD-10-CM | POA: Diagnosis not present

## 2022-01-31 DIAGNOSIS — D631 Anemia in chronic kidney disease: Secondary | ICD-10-CM | POA: Diagnosis not present

## 2022-01-31 DIAGNOSIS — I129 Hypertensive chronic kidney disease with stage 1 through stage 4 chronic kidney disease, or unspecified chronic kidney disease: Secondary | ICD-10-CM | POA: Diagnosis not present

## 2022-01-31 DIAGNOSIS — N1831 Chronic kidney disease, stage 3a: Secondary | ICD-10-CM | POA: Diagnosis not present

## 2022-01-31 DIAGNOSIS — N183 Chronic kidney disease, stage 3 unspecified: Secondary | ICD-10-CM | POA: Diagnosis not present

## 2022-01-31 MED ORDER — SODIUM CHLORIDE 0.9 % IV SOLN
510.0000 mg | Freq: Once | INTRAVENOUS | Status: AC
  Administered 2022-01-31: 510 mg via INTRAVENOUS
  Filled 2022-01-31: qty 510

## 2022-01-31 MED ORDER — SODIUM CHLORIDE 0.9 % IV SOLN: Freq: Once | INTRAVENOUS | Status: AC

## 2022-01-31 MED ORDER — ACETAMINOPHEN 325 MG PO TABS
650.0000 mg | ORAL_TABLET | Freq: Once | ORAL | Status: AC
  Administered 2022-01-31: 650 mg via ORAL
  Filled 2022-01-31: qty 2

## 2022-01-31 NOTE — Patient Instructions (Signed)
[place] at [facility] ?Discharge Instructions ? ?You were seen today by [name] for your iron deficiency anemia.  Your iron levels were low, so we will give you 2 doses of IV iron, with your first dose given today.  We will repeat labs and follow-up with phone visit in 3 months. ? ?Continue to discuss with your primary care provider regarding your other ongoing medical conditions. ? ? ?Thank you for choosing [place] at [facility] to provide your oncology and hematology care.  To afford each patient quality time with our provider, please arrive at least 15 minutes before your scheduled appointment time.  ? ?If you have a lab appointment with the [facility] please come in thru the Main Entrance and check in at the main information desk. ? ?You need to re-schedule your appointment should you arrive 10 or more minutes late.  We strive to give you quality time with our providers, and arriving late affects you and other patients whose appointments are after yours.  Also, if you no show three or more times for appointments you may be dismissed from the clinic at the providers discretion.     ?Again, thank you for choosing [facility].  Our hope is that these requests will decrease the amount of time that you wait before being seen by our physicians.       ?_____________________________________________________________ ? ?Should you have questions after your visit to [facility], please contact our office at [phone] and follow the prompts.  Our office hours are 8:00 a.m. and 4:30 p.m. Monday - Friday.  Please note that voicemails left after 4:00 p.m. may not be returned until the following business day.  We are closed weekends and major holidays.  You do have access to a nurse 24-7, just call the main number to the clinic [phone] and do not press any options, hold on the line and a nurse will answer the phone.    ? ?For prescription refill requests, have your pharmacy contact our office and allow 72 hours.   ? ?Due to Covid, you will need to wear a mask upon entering the hospital. If you do not have a mask, a mask will be given to you at the Main Entrance upon arrival. For doctor visits, patients may have 1 support person age 43 or older with them. For treatment visits, patients can not have anyone with them due to social distancing guidelines and our immunocompromised population.  ? ? ? ?

## 2022-01-31 NOTE — Progress Notes (Signed)
Patient presents today for Feraheme infusion. Vital signs stable. Patient states she was diagnosed with Covid in early February. Patient has a cough and denies any fever ,  chills, SOB. Patient took Claritin prior to arrival but will receive Tylenol while here.  ? ?Feraheme given today per MD orders. Tolerated infusion without adverse affects. Vital signs stable. No complaints at this time. Discharged from clinic ambulatory in stable condition. Alert and oriented x 3. F/U with [facility] as scheduled.   ?

## 2022-02-01 DIAGNOSIS — M79604 Pain in right leg: Secondary | ICD-10-CM | POA: Diagnosis not present

## 2022-02-01 DIAGNOSIS — R262 Difficulty in walking, not elsewhere classified: Secondary | ICD-10-CM | POA: Diagnosis not present

## 2022-02-01 DIAGNOSIS — M5416 Radiculopathy, lumbar region: Secondary | ICD-10-CM | POA: Diagnosis not present

## 2022-02-01 DIAGNOSIS — Z20822 Contact with and (suspected) exposure to covid-19: Secondary | ICD-10-CM | POA: Diagnosis not present

## 2022-02-03 DIAGNOSIS — R262 Difficulty in walking, not elsewhere classified: Secondary | ICD-10-CM | POA: Diagnosis not present

## 2022-02-03 DIAGNOSIS — M79604 Pain in right leg: Secondary | ICD-10-CM | POA: Diagnosis not present

## 2022-02-03 DIAGNOSIS — M5416 Radiculopathy, lumbar region: Secondary | ICD-10-CM | POA: Diagnosis not present

## 2022-02-07 ENCOUNTER — Inpatient Hospital Stay (HOSPITAL_COMMUNITY): Payer: Medicare Other

## 2022-02-07 ENCOUNTER — Other Ambulatory Visit: Payer: Self-pay

## 2022-02-07 VITALS — BP 103/78 | HR 88 | Temp 96.7°F | Resp 20

## 2022-02-07 DIAGNOSIS — N183 Chronic kidney disease, stage 3 unspecified: Secondary | ICD-10-CM | POA: Diagnosis not present

## 2022-02-07 DIAGNOSIS — D500 Iron deficiency anemia secondary to blood loss (chronic): Secondary | ICD-10-CM

## 2022-02-07 DIAGNOSIS — D509 Iron deficiency anemia, unspecified: Secondary | ICD-10-CM | POA: Diagnosis not present

## 2022-02-07 DIAGNOSIS — I129 Hypertensive chronic kidney disease with stage 1 through stage 4 chronic kidney disease, or unspecified chronic kidney disease: Secondary | ICD-10-CM | POA: Diagnosis not present

## 2022-02-07 DIAGNOSIS — D631 Anemia in chronic kidney disease: Secondary | ICD-10-CM | POA: Diagnosis not present

## 2022-02-07 MED ORDER — SODIUM CHLORIDE 0.9 % IV SOLN: Freq: Once | INTRAVENOUS | Status: AC

## 2022-02-07 MED ORDER — SODIUM CHLORIDE 0.9 % IV SOLN
510.0000 mg | Freq: Once | INTRAVENOUS | Status: AC
  Administered 2022-02-07: 510 mg via INTRAVENOUS
  Filled 2022-02-07: qty 510

## 2022-02-07 NOTE — Patient Instructions (Signed)
[facility]  Discharge Instructions: °Thank you for choosing [facility] to provide your oncology and hematology care.  °If you have a lab appointment with the Cancer Center, please come in thru the Main Entrance and check in at the main information desk. ° °Wear comfortable clothing and clothing appropriate for easy access to any Portacath or PICC line.  ° °We strive to give you quality time with your provider. You may need to reschedule your appointment if you arrive late (15 or more minutes).  Arriving late affects you and other patients whose appointments are after yours.  Also, if you miss three or more appointments without notifying the office, you may be dismissed from the clinic at the provider’s discretion.    °  °For prescription refill requests, have your pharmacy contact our office and allow 72 hours for refills to be completed.   ° °Today you received the following chemotherapy and/or immunotherapy agents Feraheme    °  °To help prevent nausea and vomiting after your treatment, we encourage you to take your nausea medication as directed. ° °BELOW ARE SYMPTOMS THAT SHOULD BE REPORTED IMMEDIATELY: °*FEVER GREATER THAN 100.4 F (38 °C) OR HIGHER °*CHILLS OR SWEATING °*NAUSEA AND VOMITING THAT IS NOT CONTROLLED WITH YOUR NAUSEA MEDICATION °*UNUSUAL SHORTNESS OF BREATH °*UNUSUAL BRUISING OR BLEEDING °*URINARY PROBLEMS (pain or burning when urinating, or frequent urination) °*BOWEL PROBLEMS (unusual diarrhea, constipation, pain near the anus) °TENDERNESS IN MOUTH AND THROAT WITH OR WITHOUT PRESENCE OF ULCERS (sore throat, sores in mouth, or a toothache) °UNUSUAL RASH, SWELLING OR PAIN  °UNUSUAL VAGINAL DISCHARGE OR ITCHING  ° °Items with * indicate a potential emergency and should be followed up as soon as possible or go to the Emergency Department if any problems should occur. ° °Please show the CHEMOTHERAPY ALERT CARD or IMMUNOTHERAPY ALERT CARD at check-in to the Emergency  Department and triage nurse. ° °Should you have questions after your visit or need to cancel or reschedule your appointment, please contact [facility] [phone]  and follow the prompts.  Office hours are 8:00 a.m. to 4:30 p.m. Monday - Friday. Please note that voicemails left after 4:00 p.m. may not be returned until the following business day.  We are closed weekends and major holidays. You have access to a nurse at all times for urgent questions. Please call the main number to the clinic [phone] and follow the prompts. ° °For any non-urgent questions, you may also contact your provider using MyChart. We now offer e-Visits for anyone 18 and older to request care online for non-urgent symptoms. For details visit [email or web address]. °  °Also download the MyChart app! Go to the app store, search "MyChart", open the app, select [facility], and log in with your MyChart username and password. ° °Due to Covid, a mask is required upon entering the hospital/clinic. If you do not have a mask, one will be given to you upon arrival. For doctor visits, patients may have 1 support person aged 18 or older with them. For treatment visits, patients cannot have anyone with them due to current Covid guidelines and our immunocompromised population.  °

## 2022-02-07 NOTE — Progress Notes (Signed)
Patient presents today for Feraheme infusion per providers order.  Vital signs WNL.  Patient has no new complaints at this time.    Peripheral IV started and blood return noted pre and post infusion.  Feraheme infusion  given today per MD orders.  Stable during infusion without adverse affects.  Vital signs stable.  No complaints at this time.  Discharge from clinic ambulatory in stable condition.  Alert and oriented X 3.  Follow up with [facility] as scheduled.  

## 2022-02-08 DIAGNOSIS — R262 Difficulty in walking, not elsewhere classified: Secondary | ICD-10-CM | POA: Diagnosis not present

## 2022-02-08 DIAGNOSIS — M5416 Radiculopathy, lumbar region: Secondary | ICD-10-CM | POA: Diagnosis not present

## 2022-02-08 DIAGNOSIS — M79604 Pain in right leg: Secondary | ICD-10-CM | POA: Diagnosis not present

## 2022-02-10 DIAGNOSIS — R262 Difficulty in walking, not elsewhere classified: Secondary | ICD-10-CM | POA: Diagnosis not present

## 2022-02-10 DIAGNOSIS — M79604 Pain in right leg: Secondary | ICD-10-CM | POA: Diagnosis not present

## 2022-02-10 DIAGNOSIS — M5416 Radiculopathy, lumbar region: Secondary | ICD-10-CM | POA: Diagnosis not present

## 2022-02-11 DIAGNOSIS — Z20828 Contact with and (suspected) exposure to other viral communicable diseases: Secondary | ICD-10-CM | POA: Diagnosis not present

## 2022-02-15 DIAGNOSIS — R262 Difficulty in walking, not elsewhere classified: Secondary | ICD-10-CM | POA: Diagnosis not present

## 2022-02-15 DIAGNOSIS — M79604 Pain in right leg: Secondary | ICD-10-CM | POA: Diagnosis not present

## 2022-02-15 DIAGNOSIS — M5416 Radiculopathy, lumbar region: Secondary | ICD-10-CM | POA: Diagnosis not present

## 2022-02-27 DIAGNOSIS — Z20822 Contact with and (suspected) exposure to covid-19: Secondary | ICD-10-CM | POA: Diagnosis not present

## 2022-03-03 DIAGNOSIS — M545 Low back pain, unspecified: Secondary | ICD-10-CM | POA: Diagnosis not present

## 2022-03-03 DIAGNOSIS — M5459 Other low back pain: Secondary | ICD-10-CM | POA: Diagnosis not present

## 2022-03-07 DIAGNOSIS — M25561 Pain in right knee: Secondary | ICD-10-CM | POA: Diagnosis not present

## 2022-03-07 DIAGNOSIS — M79604 Pain in right leg: Secondary | ICD-10-CM | POA: Diagnosis not present

## 2022-03-07 DIAGNOSIS — I10 Essential (primary) hypertension: Secondary | ICD-10-CM | POA: Diagnosis not present

## 2022-03-07 DIAGNOSIS — K2100 Gastro-esophageal reflux disease with esophagitis, without bleeding: Secondary | ICD-10-CM | POA: Diagnosis not present

## 2022-03-07 DIAGNOSIS — E7849 Other hyperlipidemia: Secondary | ICD-10-CM | POA: Diagnosis not present

## 2022-03-07 DIAGNOSIS — E782 Mixed hyperlipidemia: Secondary | ICD-10-CM | POA: Diagnosis not present

## 2022-03-07 DIAGNOSIS — Z1389 Encounter for screening for other disorder: Secondary | ICD-10-CM | POA: Diagnosis not present

## 2022-03-07 DIAGNOSIS — R5383 Other fatigue: Secondary | ICD-10-CM | POA: Diagnosis not present

## 2022-03-07 DIAGNOSIS — Z1331 Encounter for screening for depression: Secondary | ICD-10-CM | POA: Diagnosis not present

## 2022-03-07 DIAGNOSIS — F411 Generalized anxiety disorder: Secondary | ICD-10-CM | POA: Diagnosis not present

## 2022-03-07 DIAGNOSIS — R7309 Other abnormal glucose: Secondary | ICD-10-CM | POA: Diagnosis not present

## 2022-03-07 DIAGNOSIS — Z6834 Body mass index (BMI) 34.0-34.9, adult: Secondary | ICD-10-CM | POA: Diagnosis not present

## 2022-03-16 DIAGNOSIS — Z23 Encounter for immunization: Secondary | ICD-10-CM | POA: Diagnosis not present

## 2022-03-16 DIAGNOSIS — Z20822 Contact with and (suspected) exposure to covid-19: Secondary | ICD-10-CM | POA: Diagnosis not present

## 2022-03-20 DIAGNOSIS — Z20822 Contact with and (suspected) exposure to covid-19: Secondary | ICD-10-CM | POA: Diagnosis not present

## 2022-03-25 DIAGNOSIS — M5416 Radiculopathy, lumbar region: Secondary | ICD-10-CM | POA: Diagnosis not present

## 2022-04-04 DIAGNOSIS — M25561 Pain in right knee: Secondary | ICD-10-CM | POA: Diagnosis not present

## 2022-04-04 DIAGNOSIS — M5416 Radiculopathy, lumbar region: Secondary | ICD-10-CM | POA: Diagnosis not present

## 2022-04-04 DIAGNOSIS — M25562 Pain in left knee: Secondary | ICD-10-CM | POA: Diagnosis not present

## 2022-04-07 DIAGNOSIS — E6609 Other obesity due to excess calories: Secondary | ICD-10-CM | POA: Diagnosis not present

## 2022-04-07 DIAGNOSIS — D638 Anemia in other chronic diseases classified elsewhere: Secondary | ICD-10-CM | POA: Diagnosis not present

## 2022-04-07 DIAGNOSIS — E1122 Type 2 diabetes mellitus with diabetic chronic kidney disease: Secondary | ICD-10-CM | POA: Diagnosis not present

## 2022-04-07 DIAGNOSIS — I129 Hypertensive chronic kidney disease with stage 1 through stage 4 chronic kidney disease, or unspecified chronic kidney disease: Secondary | ICD-10-CM | POA: Diagnosis not present

## 2022-04-07 DIAGNOSIS — N189 Chronic kidney disease, unspecified: Secondary | ICD-10-CM | POA: Diagnosis not present

## 2022-04-07 DIAGNOSIS — E559 Vitamin D deficiency, unspecified: Secondary | ICD-10-CM | POA: Diagnosis not present

## 2022-04-12 ENCOUNTER — Ambulatory Visit (HOSPITAL_COMMUNITY)
Admission: RE | Admit: 2022-04-12 | Discharge: 2022-04-12 | Disposition: A | Discharge status: Home / Self Care | Payer: Medicare Other | Source: Ambulatory Visit | Attending: Nephrology | Admitting: Nephrology

## 2022-04-12 ENCOUNTER — Other Ambulatory Visit (HOSPITAL_COMMUNITY): Payer: Self-pay | Admitting: Nephrology

## 2022-04-12 DIAGNOSIS — E1122 Type 2 diabetes mellitus with diabetic chronic kidney disease: Secondary | ICD-10-CM | POA: Diagnosis not present

## 2022-04-12 DIAGNOSIS — M25562 Pain in left knee: Secondary | ICD-10-CM

## 2022-04-12 DIAGNOSIS — Z6833 Body mass index (BMI) 33.0-33.9, adult: Secondary | ICD-10-CM | POA: Diagnosis not present

## 2022-04-12 DIAGNOSIS — M1711 Unilateral primary osteoarthritis, right knee: Secondary | ICD-10-CM | POA: Diagnosis not present

## 2022-04-12 DIAGNOSIS — N189 Chronic kidney disease, unspecified: Secondary | ICD-10-CM | POA: Diagnosis not present

## 2022-04-12 DIAGNOSIS — E7849 Other hyperlipidemia: Secondary | ICD-10-CM | POA: Diagnosis not present

## 2022-04-12 DIAGNOSIS — I10 Essential (primary) hypertension: Secondary | ICD-10-CM | POA: Diagnosis not present

## 2022-04-12 DIAGNOSIS — M25561 Pain in right knee: Secondary | ICD-10-CM

## 2022-04-12 DIAGNOSIS — D508 Other iron deficiency anemias: Secondary | ICD-10-CM | POA: Diagnosis not present

## 2022-04-12 DIAGNOSIS — M11261 Other chondrocalcinosis, right knee: Secondary | ICD-10-CM | POA: Diagnosis not present

## 2022-04-12 DIAGNOSIS — I129 Hypertensive chronic kidney disease with stage 1 through stage 4 chronic kidney disease, or unspecified chronic kidney disease: Secondary | ICD-10-CM | POA: Diagnosis not present

## 2022-05-03 ENCOUNTER — Inpatient Hospital Stay (HOSPITAL_COMMUNITY): Payer: Medicare Other

## 2022-05-04 ENCOUNTER — Inpatient Hospital Stay (HOSPITAL_COMMUNITY): Payer: Medicare Other | Attending: Hematology

## 2022-05-04 DIAGNOSIS — D500 Iron deficiency anemia secondary to blood loss (chronic): Secondary | ICD-10-CM

## 2022-05-04 DIAGNOSIS — D509 Iron deficiency anemia, unspecified: Secondary | ICD-10-CM | POA: Insufficient documentation

## 2022-05-04 DIAGNOSIS — D631 Anemia in chronic kidney disease: Secondary | ICD-10-CM | POA: Insufficient documentation

## 2022-05-04 DIAGNOSIS — N183 Chronic kidney disease, stage 3 unspecified: Secondary | ICD-10-CM | POA: Diagnosis not present

## 2022-05-04 LAB — CBC WITH DIFFERENTIAL/PLATELET
Abs Immature Granulocytes: 0.01 10*3/uL (ref 0.00–0.07)
Basophils Absolute: 0 10*3/uL (ref 0.0–0.1)
Basophils Relative: 1 %
Eosinophils Absolute: 0.4 10*3/uL (ref 0.0–0.5)
Eosinophils Relative: 5 %
HCT: 38.8 % (ref 36.0–46.0)
Hemoglobin: 12.5 g/dL (ref 12.0–15.0)
Immature Granulocytes: 0 %
Lymphocytes Relative: 12 %
Lymphs Abs: 0.9 10*3/uL (ref 0.7–4.0)
MCH: 31.3 pg (ref 26.0–34.0)
MCHC: 32.2 g/dL (ref 30.0–36.0)
MCV: 97 fL (ref 80.0–100.0)
Monocytes Absolute: 0.7 10*3/uL (ref 0.1–1.0)
Monocytes Relative: 9 %
Neutro Abs: 5.4 10*3/uL (ref 1.7–7.7)
Neutrophils Relative %: 73 %
Platelets: 278 10*3/uL (ref 150–400)
RBC: 4 MIL/uL (ref 3.87–5.11)
RDW: 15.2 % (ref 11.5–15.5)
WBC: 7.3 10*3/uL (ref 4.0–10.5)
nRBC: 0 % (ref 0.0–0.2)

## 2022-05-04 LAB — FERRITIN: Ferritin: 105 ng/mL (ref 11–307)

## 2022-05-04 LAB — IRON AND TIBC
Iron: 50 ug/dL (ref 28–170)
Saturation Ratios: 15 % (ref 10.4–31.8)
TIBC: 340 ug/dL (ref 250–450)
UIBC: 290 ug/dL

## 2022-05-04 NOTE — Progress Notes (Signed)
Virtual Visit via Telephone Note [facility]  I connected with Candice [name]  on 05/05/2022 at 2:30 PM by telephone and verified that I am speaking with the correct person using two identifiers.  Location: Patient: Home Provider: [facility]   I discussed the limitations, risks, security and privacy concerns of performing an evaluation and management service by telephone and the availability of in person appointments. I also discussed with the patient that there may be a patient responsible charge related to this service. The patient expressed understanding and agreed to proceed.  REASON FOR VISIT:  Follow-up for iron deficiency anemia and anemia of CKD   PRIOR THERAPY: None   CURRENT THERAPY: Intermittent IV iron infusions (Last Feraheme 02/07/2022)   INTERVAL HISTORY:  Candice [name] 71 y.o. female returns for routine follow-up of her anemia.  She was last seen by Rojelio Brenner PA-C on 01/31/2022.  At today's visit, she reports feeling fair.  She continues to have right leg pain, which she has been told is due to a pinched nerve.  She is following with PCP and orthopedist for this. She denies any recent signs of GI bleeding such as hematemesis, hematochezia, or melena. No epistaxis or hematuria.  She reports decent energy, reports that her fatigue is not quite as bad as usual, with energy about 75%.  She denies any pica, chest pain, palpitations, dyspnea on exertion, and lightheadedness with standing. She has 75 % energy and 100 % appetite. She endorses that she is maintaining a stable weight.   OBSERVATIONS/OBJECTIVE: Review of Systems  Constitutional:  Negative for chills, diaphoresis, fever, malaise/fatigue (improved) and weight loss.  Respiratory:  Negative for cough and shortness of breath.   Cardiovascular:  Positive for leg swelling. Negative for chest pain and palpitations.  Gastrointestinal:  Negative for abdominal pain, blood in stool, melena,  nausea and vomiting.  Neurological:  Positive for tingling and headaches. Negative for dizziness.     PHYSICAL EXAM (per limitations of virtual telephone visit): The patient is alert and oriented x 3, exhibiting adequate mentation, good mood, and ability to speak in full sentences and execute sound judgement.   ASSESSMENT & PLAN: 1.  Anemia due to iron deficiency and kidney disease:  - Etiology is possible chronic bleeding from the small bowel, relative iron deficiency and CKD. - CBC on 05/20/2021 showed Hgb 7.2 - was transfused PRBC x1 - There may be an element of early MDS, noted macrocytosis with MCV 104.3 - She has had previous heme-positive stool and reports intermittent rectal bleeding   - EGD (10/14/2021): Large hiatal hernia with Sheria Lang ulcers, nonbleeding angiodysplastic lesion in stomach treated with APC, nonbleeding angiodysplastic lesions in duodenum x5 treated with APC - Colonoscopy (10/14/2021): Polyps, diverticulosis, nonbleeding internal hemorrhoids - SPEP/IFE in May 2021 was negative for monoclonal gammopathy - CKD stage III with baseline creatinine 1.3-1.6 (follows with Dr. Wolfgang Phoenix) - Anemia noted to be macrocytic, but vitamin B12 and TSH are normal - She failed to improve on oral iron supplementation and experienced severe constipation - Last Feraheme x2 on 01/31/2022 and 02/07/2022 - Most recent labs (05/05/2022): Hgb 12.5/MCV 97.0, ferritin 105, iron saturation 15% - PLAN: No indication for IV iron at this time - Repeat labs and RTC in 4 months with PHONE visit - Continue follow-up with GI - If she has Hgb < 10.0 despite iron repletion, would consider starting her on ESA injections.    I discussed the assessment and treatment plan with the  patient. The patient was provided an opportunity to ask questions and all were answered. The patient agreed with the plan and demonstrated an understanding of the instructions.   The patient was advised to call back or seek an in-person  evaluation if the symptoms worsen or if the condition fails to improve as anticipated.  I provided 22 minutes of non-face-to-face time during this encounter.   Carnella Guadalajara, PA-C 05/05/2022 3:16 PM

## 2022-05-05 ENCOUNTER — Inpatient Hospital Stay (HOSPITAL_BASED_OUTPATIENT_CLINIC_OR_DEPARTMENT_OTHER): Payer: Medicare Other | Admitting: Physician Assistant

## 2022-05-05 ENCOUNTER — Encounter (HOSPITAL_COMMUNITY): Payer: Self-pay | Admitting: Physician Assistant

## 2022-05-05 DIAGNOSIS — D500 Iron deficiency anemia secondary to blood loss (chronic): Secondary | ICD-10-CM | POA: Diagnosis not present

## 2022-05-05 DIAGNOSIS — N189 Chronic kidney disease, unspecified: Secondary | ICD-10-CM | POA: Diagnosis not present

## 2022-05-05 DIAGNOSIS — N1831 Chronic kidney disease, stage 3a: Secondary | ICD-10-CM

## 2022-05-05 DIAGNOSIS — D631 Anemia in chronic kidney disease: Secondary | ICD-10-CM

## 2022-05-17 DIAGNOSIS — M25561 Pain in right knee: Secondary | ICD-10-CM | POA: Diagnosis not present

## 2022-05-17 DIAGNOSIS — M545 Low back pain, unspecified: Secondary | ICD-10-CM | POA: Diagnosis not present

## 2022-05-17 DIAGNOSIS — M25562 Pain in left knee: Secondary | ICD-10-CM | POA: Diagnosis not present

## 2022-05-23 ENCOUNTER — Telehealth (INDEPENDENT_AMBULATORY_CARE_PROVIDER_SITE_OTHER): Payer: Self-pay

## 2022-05-23 ENCOUNTER — Other Ambulatory Visit (INDEPENDENT_AMBULATORY_CARE_PROVIDER_SITE_OTHER): Payer: Self-pay

## 2022-05-23 DIAGNOSIS — D508 Other iron deficiency anemias: Secondary | ICD-10-CM

## 2022-05-23 DIAGNOSIS — R195 Other fecal abnormalities: Secondary | ICD-10-CM

## 2022-05-23 DIAGNOSIS — M79662 Pain in left lower leg: Secondary | ICD-10-CM | POA: Diagnosis not present

## 2022-05-23 MED ORDER — PANTOPRAZOLE SODIUM 40 MG PO TBEC: 40.0000 mg | DELAYED_RELEASE_TABLET | Freq: Every day | ORAL | 2 refills | Status: DC

## 2022-05-23 NOTE — Telephone Encounter (Signed)
I made patient aware that she can decrease to once per day.I have sent in a new rx to her pharmacy.

## 2022-05-23 NOTE — Telephone Encounter (Signed)
I agree, her hemoglobin has been stable and iron stores are normal.  She can have a refill of once a day dosing.  Please send a prescription of this. Thanks

## 2022-05-23 NOTE — Telephone Encounter (Signed)
Patient is needing refills on her pantoprazole bid, does she need to stay on this dose? Per note from 10/2021 was supposed to decrease to once per day after three months, and per EGD results from 10/14/2021 once hgb was above 12 decrease to once per day.Hgb 12.5  on 05/04/2022. Please advise Below is reference from previous note regarding decreasing.     Spoke to the patient about her diagnosis, discussed with patient Lysbeth Galas ulcers, hiatal hernia and AVMs, and  need to be on PPI BID for 3 months and then decrease to once a day dosing.           11/03/21 12:29 PM You routed this conversation to Harvel Quale, MD  Me      11/03/21 12:29 PM Note Patient called today wanting to know why she was placed on Pantoprazole bid. I told her that the doctor wanted her to try to stay on it bid for a month and if Hgb is above 12 after the one month she can return to taking it once per day. Even after explaining to the patient she is adamant that the pantoprazole causes bleeding and she does not want to take it. She states she did her research and read the enclosed pamphlet that came with the medication and it stated it can cause bleeding. She would like to know if there is something else she can take in place of this. Please advise.    EGD RESULTS 10/14/2021 :Increase pantoprazole to twice a day dosing for 3 months, can decrease to once a month if hemoglobin is above 12. - Continue IV iron. - If recurrent anemia after decreasing pantoprazole once a day, may need to be on twice a day dosing indefinitely. - May need capsule endoscopy if worsening anemia - will need endoscopic deploymeent. -

## 2022-05-26 DIAGNOSIS — M25561 Pain in right knee: Secondary | ICD-10-CM | POA: Diagnosis not present

## 2022-06-01 DIAGNOSIS — M25561 Pain in right knee: Secondary | ICD-10-CM | POA: Diagnosis not present

## 2022-06-01 DIAGNOSIS — S83241A Other tear of medial meniscus, current injury, right knee, initial encounter: Secondary | ICD-10-CM | POA: Diagnosis not present

## 2022-06-07 ENCOUNTER — Ambulatory Visit: Payer: Self-pay | Admitting: *Deleted

## 2022-06-07 ENCOUNTER — Encounter: Payer: Self-pay | Admitting: *Deleted

## 2022-06-07 NOTE — Patient Instructions (Signed)
Visit Information  Thank you for taking time to visit with me today. Please don't hesitate to contact me if I can be of assistance to you before our next scheduled telephone appointment.  Please call the care guide team at [phone] if you need to cancel or reschedule your appointment.   If you are experiencing a Mental Health or [place] or need someone to talk to, please call the Suicide and Crisis Lifeline: 988 call the [place] National Suicide Prevention Lifeline: 248-652-4099 or TTY: 502-038-2598 TTY (515)084-2648) to talk to a trained counselor call 1-800-273-TALK (toll free, 24 hour hotline) go to [facility] [address], [place] [phone]) call the [place]: [phone] call 911   Ms. Dowler was given information about Care Management services by the embedded care coordination team including:  Care Management services include personalized support from designated clinical staff supervised by her physician, including individualized plan of care and coordination with other care providers 24/7 contact phone numbers for assistance for urgent and routine care needs. The patient may stop CCM services at any time (effective at the end of the month) by phone call to the office staff.  Patient agreed to services and verbal consent obtained.   Patient verbalizes understanding of instructions and care plan provided today and agrees to view in [place]. Active MyChart status and patient understanding of how to access instructions and care plan via MyChart confirmed with patient.     No further follow up required.  Nat Christen, BSW, MSW, LCSW  Licensed Education officer, environmental [facility]  Mailing [place] [address] [place], [place] [address] Physical Address-[address], [place], [place] [address] Toll Free Main # [phone] Fax # [phone] Cell  # [phone] [email or web address]

## 2022-06-07 NOTE — Patient Outreach (Signed)
  Care Coordination   Initial Visit Note   06/07/2022 Name: Candice [name] MRN: [id number] DOB: June 19, 1951  Candice [name] is a 71 y.o. year old female who sees Lanelle Bal, [place] for primary care. I spoke with  Anselm Jungling by phone today.  What matters to the patients health and wellness today?  No Interventions Identified.   Goals Addressed   None     SDOH assessments and interventions completed:   Yes SDOH Interventions Today    Flowsheet Row Most Recent Value  SDOH Interventions   Food Insecurity Interventions Intervention Not Indicated  Financial Strain Interventions Intervention Not Indicated  Housing Interventions Intervention Not Indicated  Physical Activity Interventions Patient Refused  Stress Interventions Intervention Not Indicated  Social Connections Interventions Intervention Not Indicated  Transportation Interventions Intervention Not Indicated       Care Coordination Interventions Activated:  No  Care Coordination Interventions:  No, not indicated.  Follow up plan: No further intervention required.  Encounter Outcome:  Pt. Visit Completed.  Nat Christen, BSW, MSW, LCSW  Licensed Education officer, environmental Health [facility]  Mailing [place] [address] [place], [place] [address] Physical Address-[address], [place], [place] [address] Toll Free Main # [phone] Fax # [phone] Cell # [phone] [email or web address]

## 2022-06-14 DIAGNOSIS — M94261 Chondromalacia, right knee: Secondary | ICD-10-CM | POA: Diagnosis not present

## 2022-06-14 DIAGNOSIS — M659 Synovitis and tenosynovitis, unspecified: Secondary | ICD-10-CM | POA: Diagnosis not present

## 2022-06-14 DIAGNOSIS — M11261 Other chondrocalcinosis, right knee: Secondary | ICD-10-CM | POA: Diagnosis not present

## 2022-06-14 DIAGNOSIS — Y999 Unspecified external cause status: Secondary | ICD-10-CM | POA: Diagnosis not present

## 2022-06-14 DIAGNOSIS — X58XXXA Exposure to other specified factors, initial encounter: Secondary | ICD-10-CM | POA: Diagnosis not present

## 2022-06-14 DIAGNOSIS — S83231A Complex tear of medial meniscus, current injury, right knee, initial encounter: Secondary | ICD-10-CM | POA: Diagnosis not present

## 2022-06-14 DIAGNOSIS — G8918 Other acute postprocedural pain: Secondary | ICD-10-CM | POA: Diagnosis not present

## 2022-06-14 DIAGNOSIS — S83271A Complex tear of lateral meniscus, current injury, right knee, initial encounter: Secondary | ICD-10-CM | POA: Diagnosis not present

## 2022-06-28 DIAGNOSIS — Z4789 Encounter for other orthopedic aftercare: Secondary | ICD-10-CM | POA: Diagnosis not present

## 2022-07-14 DIAGNOSIS — N189 Chronic kidney disease, unspecified: Secondary | ICD-10-CM | POA: Diagnosis not present

## 2022-07-14 DIAGNOSIS — M25561 Pain in right knee: Secondary | ICD-10-CM | POA: Diagnosis not present

## 2022-07-14 DIAGNOSIS — M25562 Pain in left knee: Secondary | ICD-10-CM | POA: Diagnosis not present

## 2022-07-14 DIAGNOSIS — Z6833 Body mass index (BMI) 33.0-33.9, adult: Secondary | ICD-10-CM | POA: Diagnosis not present

## 2022-07-14 DIAGNOSIS — I129 Hypertensive chronic kidney disease with stage 1 through stage 4 chronic kidney disease, or unspecified chronic kidney disease: Secondary | ICD-10-CM | POA: Diagnosis not present

## 2022-07-14 DIAGNOSIS — E1122 Type 2 diabetes mellitus with diabetic chronic kidney disease: Secondary | ICD-10-CM | POA: Diagnosis not present

## 2022-07-19 DIAGNOSIS — E1122 Type 2 diabetes mellitus with diabetic chronic kidney disease: Secondary | ICD-10-CM | POA: Diagnosis not present

## 2022-07-19 DIAGNOSIS — Z23 Encounter for immunization: Secondary | ICD-10-CM | POA: Diagnosis not present

## 2022-07-19 DIAGNOSIS — I129 Hypertensive chronic kidney disease with stage 1 through stage 4 chronic kidney disease, or unspecified chronic kidney disease: Secondary | ICD-10-CM | POA: Diagnosis not present

## 2022-07-19 DIAGNOSIS — Z6833 Body mass index (BMI) 33.0-33.9, adult: Secondary | ICD-10-CM | POA: Diagnosis not present

## 2022-07-19 DIAGNOSIS — D500 Iron deficiency anemia secondary to blood loss (chronic): Secondary | ICD-10-CM | POA: Diagnosis not present

## 2022-07-19 DIAGNOSIS — N189 Chronic kidney disease, unspecified: Secondary | ICD-10-CM | POA: Diagnosis not present

## 2022-08-02 DIAGNOSIS — Z23 Encounter for immunization: Secondary | ICD-10-CM | POA: Diagnosis not present

## 2022-08-14 ENCOUNTER — Encounter (HOSPITAL_COMMUNITY): Payer: Self-pay | Admitting: Hematology

## 2022-08-17 DIAGNOSIS — M25562 Pain in left knee: Secondary | ICD-10-CM | POA: Diagnosis not present

## 2022-08-17 DIAGNOSIS — Z4789 Encounter for other orthopedic aftercare: Secondary | ICD-10-CM | POA: Diagnosis not present

## 2022-08-25 ENCOUNTER — Encounter (HOSPITAL_COMMUNITY): Payer: Self-pay | Admitting: Hematology

## 2022-08-28 DIAGNOSIS — F411 Generalized anxiety disorder: Secondary | ICD-10-CM | POA: Diagnosis not present

## 2022-08-28 DIAGNOSIS — J209 Acute bronchitis, unspecified: Secondary | ICD-10-CM | POA: Diagnosis not present

## 2022-08-28 DIAGNOSIS — M25562 Pain in left knee: Secondary | ICD-10-CM | POA: Diagnosis not present

## 2022-08-28 DIAGNOSIS — E7849 Other hyperlipidemia: Secondary | ICD-10-CM | POA: Diagnosis not present

## 2022-08-28 DIAGNOSIS — R5383 Other fatigue: Secondary | ICD-10-CM | POA: Diagnosis not present

## 2022-08-28 DIAGNOSIS — K2100 Gastro-esophageal reflux disease with esophagitis, without bleeding: Secondary | ICD-10-CM | POA: Diagnosis not present

## 2022-08-28 DIAGNOSIS — M25561 Pain in right knee: Secondary | ICD-10-CM | POA: Diagnosis not present

## 2022-08-28 DIAGNOSIS — I10 Essential (primary) hypertension: Secondary | ICD-10-CM | POA: Diagnosis not present

## 2022-08-28 DIAGNOSIS — M79604 Pain in right leg: Secondary | ICD-10-CM | POA: Diagnosis not present

## 2022-08-28 DIAGNOSIS — Z6834 Body mass index (BMI) 34.0-34.9, adult: Secondary | ICD-10-CM | POA: Diagnosis not present

## 2022-09-01 ENCOUNTER — Inpatient Hospital Stay: Payer: Medicare Other | Attending: Hematology

## 2022-09-01 DIAGNOSIS — N1831 Chronic kidney disease, stage 3a: Secondary | ICD-10-CM

## 2022-09-01 DIAGNOSIS — D631 Anemia in chronic kidney disease: Secondary | ICD-10-CM | POA: Diagnosis not present

## 2022-09-01 DIAGNOSIS — N183 Chronic kidney disease, stage 3 unspecified: Secondary | ICD-10-CM | POA: Insufficient documentation

## 2022-09-01 DIAGNOSIS — D509 Iron deficiency anemia, unspecified: Secondary | ICD-10-CM | POA: Diagnosis not present

## 2022-09-01 DIAGNOSIS — D500 Iron deficiency anemia secondary to blood loss (chronic): Secondary | ICD-10-CM

## 2022-09-01 LAB — CBC WITH DIFFERENTIAL/PLATELET
Abs Immature Granulocytes: 0.05 10*3/uL (ref 0.00–0.07)
Basophils Absolute: 0 10*3/uL (ref 0.0–0.1)
Basophils Relative: 0 %
Eosinophils Absolute: 0.1 10*3/uL (ref 0.0–0.5)
Eosinophils Relative: 1 %
HCT: 36.1 % (ref 36.0–46.0)
Hemoglobin: 11.3 g/dL — ABNORMAL LOW (ref 12.0–15.0)
Immature Granulocytes: 1 %
Lymphocytes Relative: 11 %
Lymphs Abs: 1 10*3/uL (ref 0.7–4.0)
MCH: 30.3 pg (ref 26.0–34.0)
MCHC: 31.3 g/dL (ref 30.0–36.0)
MCV: 96.8 fL (ref 80.0–100.0)
Monocytes Absolute: 0.4 10*3/uL (ref 0.1–1.0)
Monocytes Relative: 5 %
Neutro Abs: 7.5 10*3/uL (ref 1.7–7.7)
Neutrophils Relative %: 82 %
Platelets: 327 10*3/uL (ref 150–400)
RBC: 3.73 MIL/uL — ABNORMAL LOW (ref 3.87–5.11)
RDW: 16.1 % — ABNORMAL HIGH (ref 11.5–15.5)
WBC: 9 10*3/uL (ref 4.0–10.5)
nRBC: 0 % (ref 0.0–0.2)

## 2022-09-01 LAB — COMPREHENSIVE METABOLIC PANEL
ALT: 12 U/L (ref 0–44)
AST: 16 U/L (ref 15–41)
Albumin: 3.6 g/dL (ref 3.5–5.0)
Alkaline Phosphatase: 60 U/L (ref 38–126)
Anion gap: 8 (ref 5–15)
BUN: 24 mg/dL — ABNORMAL HIGH (ref 8–23)
CO2: 28 mmol/L (ref 22–32)
Calcium: 9.7 mg/dL (ref 8.9–10.3)
Chloride: 105 mmol/L (ref 98–111)
Creatinine, Ser: 1.09 mg/dL — ABNORMAL HIGH (ref 0.44–1.00)
GFR, Estimated: 54 mL/min — ABNORMAL LOW (ref >60)
Glucose, Bld: 117 mg/dL — ABNORMAL HIGH (ref 70–99)
Potassium: 3.7 mmol/L (ref 3.5–5.1)
Sodium: 141 mmol/L (ref 135–145)
Total Bilirubin: 0.4 mg/dL (ref 0.3–1.2)
Total Protein: 6.6 g/dL (ref 6.5–8.1)

## 2022-09-01 LAB — IRON AND TIBC
Iron: 46 ug/dL (ref 28–170)
Saturation Ratios: 11 % (ref 10.4–31.8)
TIBC: 407 ug/dL (ref 250–450)
UIBC: 361 ug/dL

## 2022-09-01 LAB — FERRITIN: Ferritin: 9 ng/mL — ABNORMAL LOW (ref 11–307)

## 2022-09-05 DIAGNOSIS — Z1231 Encounter for screening mammogram for malignant neoplasm of breast: Secondary | ICD-10-CM | POA: Diagnosis not present

## 2022-09-08 ENCOUNTER — Other Ambulatory Visit: Payer: Self-pay

## 2022-09-08 ENCOUNTER — Encounter: Payer: Self-pay | Admitting: Physician Assistant

## 2022-09-08 ENCOUNTER — Inpatient Hospital Stay (HOSPITAL_BASED_OUTPATIENT_CLINIC_OR_DEPARTMENT_OTHER): Payer: Medicare Other | Admitting: Physician Assistant

## 2022-09-08 DIAGNOSIS — D500 Iron deficiency anemia secondary to blood loss (chronic): Secondary | ICD-10-CM

## 2022-09-08 DIAGNOSIS — D631 Anemia in chronic kidney disease: Secondary | ICD-10-CM

## 2022-09-08 DIAGNOSIS — D509 Iron deficiency anemia, unspecified: Secondary | ICD-10-CM

## 2022-09-08 DIAGNOSIS — N1831 Chronic kidney disease, stage 3a: Secondary | ICD-10-CM

## 2022-09-08 NOTE — Progress Notes (Signed)
Virtual Visit via Telephone Note [facility]  I connected with Candice [name]  on 09/08/2022 at 3:20 PM by telephone and verified that I am speaking with the correct person using two identifiers.  Location: Patient: Home Provider: [facility]   I discussed the limitations, risks, security and privacy concerns of performing an evaluation and management service by telephone and the availability of in person appointments. I also discussed with the patient that there may be a patient responsible charge related to this service. The patient expressed understanding and agreed to proceed.  REASON FOR VISIT:  Follow-up for iron deficiency anemia and anemia of CKD   PRIOR THERAPY: None   CURRENT THERAPY: Intermittent IV iron infusions (Last Feraheme 02/07/2022)   INTERVAL HISTORY:  [name] 71 y.o. female is contacted today for routine follow-up of her anemia.  She was last seen by [name] on 05/05/2022.  At today's visit, she reports feeling fair.  She reports recent right knee surgery in September 2023.  She has been feeling increasingly fatigued, along with some dyspnea on exertion.  She denies any obvious hematochezia, melena, or epistaxis.  She denies any pica, chest pain, palpitations, or syncope.  She reports 40 to 50% energy and 75% appetite.  She states that she is maintaining a stable weight.     OBSERVATIONS/OBJECTIVE: Review of Systems  Constitutional:  Positive for malaise/fatigue. Negative for chills, diaphoresis, fever and weight loss.  Respiratory:  Positive for shortness of breath. Negative for cough.   Cardiovascular:  Negative for chest pain and palpitations.  Gastrointestinal:  Negative for abdominal pain, blood in stool, melena, nausea and vomiting.  Musculoskeletal:  Positive for joint pain.  Neurological:  Negative for dizziness and headaches.     PHYSICAL EXAM (per limitations of virtual telephone visit): The patient  is alert and oriented x 3, exhibiting adequate mentation, good mood, and ability to speak in full sentences and execute sound judgement.   ASSESSMENT & PLAN: 1.  Anemia due to iron deficiency and kidney disease:  - Etiology is possible chronic bleeding from the small bowel, relative iron deficiency and CKD. - CBC on 05/20/2021 showed Hgb 7.2 - was transfused PRBC x1 - There may be an element of early MDS, noted macrocytosis with MCV 104.3 - She has had previous heme-positive stool and reports intermittent rectal bleeding   - EGD (10/14/2021): Large hiatal hernia with Lysbeth Galas ulcers, nonbleeding angiodysplastic lesion in stomach treated with APC, nonbleeding angiodysplastic lesions in duodenum x5 treated with APC - Colonoscopy (10/14/2021): Polyps, diverticulosis, nonbleeding internal hemorrhoids - SPEP/IFE in May 2021 was negative for monoclonal gammopathy - CKD stage III with baseline creatinine 1.3-1.6 (follows with Dr. Theador Hawthorne) - Anemia noted to be macrocytic, but vitamin B12 and TSH are normal - She failed to improve on oral iron supplementation and experienced severe constipation - Last Feraheme x2 on 01/31/2022 and 02/07/2022 - Most recent labs (09/01/2022): Hgb 11.3/MCV 96.8, ferritin 9, iron saturation 11%.  Creatinine 1.09/GFR 54. - Symptomatic with fatigue and dyspnea on exertion - PLAN: Recommend IV Feraheme x2 - Repeat labs and RTC in 4 months with PHONE visit - Continue follow-up with GI - If she has Hgb < 10.0 despite iron repletion, would consider starting her on ESA injections.   FOLLOW UP INSTRUCTIONS: IV Feraheme x2 Labs in 4 months Phone visit after labs    I discussed the assessment and treatment plan with the patient. The patient was provided an opportunity to  ask questions and all were answered. The patient agreed with the plan and demonstrated an understanding of the instructions.   The patient was advised to call back or seek an in-person evaluation if the symptoms  worsen or if the condition fails to improve as anticipated.  I provided 22 minutes of non-face-to-face time during this encounter.   Harriett Rush, PA-C 09/08/2022 4:21 PM

## 2022-09-14 MED FILL — Ferumoxytol Inj 510 MG/17ML (30 MG/ML) (Elemental Fe): INTRAVENOUS | Qty: 17 | Status: AC

## 2022-09-15 ENCOUNTER — Inpatient Hospital Stay: Payer: Medicare Other | Attending: Hematology

## 2022-09-15 VITALS — BP 125/70 | HR 62 | Temp 97.6°F | Resp 17

## 2022-09-15 DIAGNOSIS — N189 Chronic kidney disease, unspecified: Secondary | ICD-10-CM | POA: Diagnosis not present

## 2022-09-15 DIAGNOSIS — D509 Iron deficiency anemia, unspecified: Secondary | ICD-10-CM | POA: Insufficient documentation

## 2022-09-15 DIAGNOSIS — D500 Iron deficiency anemia secondary to blood loss (chronic): Secondary | ICD-10-CM

## 2022-09-15 DIAGNOSIS — D631 Anemia in chronic kidney disease: Secondary | ICD-10-CM | POA: Insufficient documentation

## 2022-09-15 MED ORDER — SODIUM CHLORIDE 0.9 % IV SOLN
510.0000 mg | Freq: Once | INTRAVENOUS | Status: AC
  Administered 2022-09-15: 510 mg via INTRAVENOUS
  Filled 2022-09-15: qty 510

## 2022-09-15 MED ORDER — SODIUM CHLORIDE 0.9 % IV SOLN: Freq: Once | INTRAVENOUS | Status: AC

## 2022-09-15 NOTE — Patient Instructions (Signed)
[facility]  Discharge Instructions: Thank you for choosing [facility] to provide your oncology and hematology care.  If you have a lab appointment with the Cancer Center, please come in thru the Main Entrance and check in at the main information desk.  Wear comfortable clothing and clothing appropriate for easy access to any Portacath or PICC line.   We strive to give you quality time with your provider. You may need to reschedule your appointment if you arrive late (15 or more minutes).  Arriving late affects you and other patients whose appointments are after yours.  Also, if you miss three or more appointments without notifying the office, you may be dismissed from the clinic at the provider's discretion.      For prescription refill requests, have your pharmacy contact our office and allow 72 hours for refills to be completed.    Today you received the following Feraheme, return as scheduled.   To help prevent nausea and vomiting after your treatment, we encourage you to take your nausea medication as directed.  BELOW ARE SYMPTOMS THAT SHOULD BE REPORTED IMMEDIATELY: *FEVER GREATER THAN 100.4 F (38 C) OR HIGHER *CHILLS OR SWEATING *NAUSEA AND VOMITING THAT IS NOT CONTROLLED WITH YOUR NAUSEA MEDICATION *UNUSUAL SHORTNESS OF BREATH *UNUSUAL BRUISING OR BLEEDING *URINARY PROBLEMS (pain or burning when urinating, or frequent urination) *BOWEL PROBLEMS (unusual diarrhea, constipation, pain near the anus) TENDERNESS IN MOUTH AND THROAT WITH OR WITHOUT PRESENCE OF ULCERS (sore throat, sores in mouth, or a toothache) UNUSUAL RASH, SWELLING OR PAIN  UNUSUAL VAGINAL DISCHARGE OR ITCHING   Items with * indicate a potential emergency and should be followed up as soon as possible or go to the Emergency Department if any problems should occur.  Please show the CHEMOTHERAPY ALERT CARD or IMMUNOTHERAPY ALERT CARD at check-in to the Emergency Department and  triage nurse.  Should you have questions after your visit or need to cancel or reschedule your appointment, please contact [facility] [phone]  and follow the prompts.  Office hours are 8:00 a.m. to 4:30 p.m. Monday - Friday. Please note that voicemails left after 4:00 p.m. may not be returned until the following business day.  We are closed weekends and major holidays. You have access to a nurse at all times for urgent questions. Please call the main number to the clinic [phone] and follow the prompts.  For any non-urgent questions, you may also contact your provider using MyChart. We now offer e-Visits for anyone 18 and older to request care online for non-urgent symptoms. For details visit [email or web address].   Also download the MyChart app! Go to the app store, search "MyChart", open the app, select , and log in with your MyChart username and password.  Masks are optional in the cancer centers. If you would like for your care team to wear a mask while they are taking care of you, please let them know. You may have one support person who is at least 73 years old accompany you for your appointments.  

## 2022-09-15 NOTE — Progress Notes (Signed)
Patient presents today for feraheme infusion, patient reports taking tylenol and Claritin at home. Patient tolerated iron infusion with no complaints voiced. Peripheral IV site clean and dry with good blood return noted before and after infusion. Band aid applied. VSS with discharge and left in satisfactory condition with no s/s of distress noted.

## 2022-09-21 DIAGNOSIS — M79604 Pain in right leg: Secondary | ICD-10-CM | POA: Diagnosis not present

## 2022-09-22 ENCOUNTER — Inpatient Hospital Stay: Payer: Medicare Other

## 2022-09-22 VITALS — BP 121/53 | HR 70 | Temp 97.1°F | Resp 18

## 2022-09-22 DIAGNOSIS — N189 Chronic kidney disease, unspecified: Secondary | ICD-10-CM | POA: Diagnosis not present

## 2022-09-22 DIAGNOSIS — D500 Iron deficiency anemia secondary to blood loss (chronic): Secondary | ICD-10-CM

## 2022-09-22 DIAGNOSIS — D509 Iron deficiency anemia, unspecified: Secondary | ICD-10-CM | POA: Diagnosis not present

## 2022-09-22 DIAGNOSIS — D631 Anemia in chronic kidney disease: Secondary | ICD-10-CM | POA: Diagnosis not present

## 2022-09-22 MED ORDER — SODIUM CHLORIDE 0.9 % IV SOLN
510.0000 mg | Freq: Once | INTRAVENOUS | Status: AC
  Administered 2022-09-22: 510 mg via INTRAVENOUS
  Filled 2022-09-22: qty 510

## 2022-09-22 MED ORDER — SODIUM CHLORIDE 0.9 % IV SOLN: Freq: Once | INTRAVENOUS | Status: AC

## 2022-09-22 NOTE — Patient Instructions (Signed)
[facility]  Discharge Instructions: Thank you for choosing [facility] to provide your oncology and hematology care.  If you have a lab appointment with the [facility], please come in thru the Main Entrance and check in at the main information desk.  Wear comfortable clothing and clothing appropriate for easy access to any Portacath or PICC line.   We strive to give you quality time with your provider. You may need to reschedule your appointment if you arrive late (15 or more minutes).  Arriving late affects you and other patients whose appointments are after yours.  Also, if you miss three or more appointments without notifying the office, you may be dismissed from the clinic at the provider's discretion.      For prescription refill requests, have your pharmacy contact our office and allow 72 hours for refills to be completed.    Today you received feraheme      BELOW ARE SYMPTOMS THAT SHOULD BE REPORTED IMMEDIATELY: *FEVER GREATER THAN 100.4 F (38 C) OR HIGHER *CHILLS OR SWEATING *NAUSEA AND VOMITING THAT IS NOT CONTROLLED WITH YOUR NAUSEA MEDICATION *UNUSUAL SHORTNESS OF BREATH *UNUSUAL BRUISING OR BLEEDING *URINARY PROBLEMS (pain or burning when urinating, or frequent urination) *BOWEL PROBLEMS (unusual diarrhea, constipation, pain near the anus) TENDERNESS IN MOUTH AND THROAT WITH OR WITHOUT PRESENCE OF ULCERS (sore throat, sores in mouth, or a toothache) UNUSUAL RASH, SWELLING OR PAIN  UNUSUAL VAGINAL DISCHARGE OR ITCHING   Items with * indicate a potential emergency and should be followed up as soon as possible or go to the Emergency Department if any problems should occur.  Please show the CHEMOTHERAPY ALERT CARD or IMMUNOTHERAPY ALERT CARD at check-in to the Emergency Department and triage nurse.  Should you have questions after your visit or need to cancel or reschedule your appointment, please contact [facility]  [phone]  and follow the prompts.  Office hours are 8:00 a.m. to 4:30 p.m. Monday - Friday. Please note that voicemails left after 4:00 p.m. may not be returned until the following business day.  We are closed weekends and major holidays. You have access to a nurse at all times for urgent questions. Please call the main number to the clinic [phone] and follow the prompts.  For any non-urgent questions, you may also contact your provider using MyChart. We now offer e-Visits for anyone 42 and older to request care online for non-urgent symptoms. For details visit [email or web address].   Also download the MyChart app! Go to the app store, search "MyChart", open the app, select [facility], and log in with your MyChart username and password.  Masks are optional in the cancer centers. If you would like for your care team to wear a mask while they are taking care of you, please let them know. You may have one support person who is at least 73 years old accompany you for your appointments.

## 2022-09-22 NOTE — Progress Notes (Signed)
Pt presents today for Feraheme IV iron per provider's order. Vital signs stable and pt voiced no new complaints at this time.  Pt took pre-meds at home prior to arrival. Peripheral IV started with good blood return pre and post infusion.  Feraheme given today per MD orders. Tolerated infusion without adverse affects. Vital signs stable. No complaints at this time. Discharged from clinic ambulatory in stable condition. Alert and oriented x 3. F/U with [facility] as scheduled.

## 2022-10-14 DIAGNOSIS — M25561 Pain in right knee: Secondary | ICD-10-CM | POA: Diagnosis not present

## 2022-10-14 DIAGNOSIS — M25562 Pain in left knee: Secondary | ICD-10-CM | POA: Diagnosis not present

## 2022-11-14 DIAGNOSIS — M25561 Pain in right knee: Secondary | ICD-10-CM | POA: Diagnosis not present

## 2022-11-14 DIAGNOSIS — J209 Acute bronchitis, unspecified: Secondary | ICD-10-CM | POA: Diagnosis not present

## 2022-11-14 DIAGNOSIS — J040 Acute laryngitis: Secondary | ICD-10-CM | POA: Diagnosis not present

## 2022-11-14 DIAGNOSIS — Z20828 Contact with and (suspected) exposure to other viral communicable diseases: Secondary | ICD-10-CM | POA: Diagnosis not present

## 2022-11-14 DIAGNOSIS — Z6835 Body mass index (BMI) 35.0-35.9, adult: Secondary | ICD-10-CM | POA: Diagnosis not present

## 2022-11-14 DIAGNOSIS — R030 Elevated blood-pressure reading, without diagnosis of hypertension: Secondary | ICD-10-CM | POA: Diagnosis not present

## 2022-11-21 DIAGNOSIS — D500 Iron deficiency anemia secondary to blood loss (chronic): Secondary | ICD-10-CM | POA: Diagnosis not present

## 2022-11-21 DIAGNOSIS — E1122 Type 2 diabetes mellitus with diabetic chronic kidney disease: Secondary | ICD-10-CM | POA: Diagnosis not present

## 2022-11-21 DIAGNOSIS — I129 Hypertensive chronic kidney disease with stage 1 through stage 4 chronic kidney disease, or unspecified chronic kidney disease: Secondary | ICD-10-CM | POA: Diagnosis not present

## 2022-11-21 DIAGNOSIS — N189 Chronic kidney disease, unspecified: Secondary | ICD-10-CM | POA: Diagnosis not present

## 2022-11-21 DIAGNOSIS — Z6833 Body mass index (BMI) 33.0-33.9, adult: Secondary | ICD-10-CM | POA: Diagnosis not present

## 2022-11-24 DIAGNOSIS — R519 Headache, unspecified: Secondary | ICD-10-CM | POA: Diagnosis not present

## 2022-11-24 DIAGNOSIS — I129 Hypertensive chronic kidney disease with stage 1 through stage 4 chronic kidney disease, or unspecified chronic kidney disease: Secondary | ICD-10-CM | POA: Diagnosis not present

## 2022-11-24 DIAGNOSIS — Z6835 Body mass index (BMI) 35.0-35.9, adult: Secondary | ICD-10-CM | POA: Diagnosis not present

## 2022-11-24 DIAGNOSIS — E1122 Type 2 diabetes mellitus with diabetic chronic kidney disease: Secondary | ICD-10-CM | POA: Diagnosis not present

## 2022-11-24 DIAGNOSIS — N189 Chronic kidney disease, unspecified: Secondary | ICD-10-CM | POA: Diagnosis not present

## 2022-11-24 DIAGNOSIS — R0683 Snoring: Secondary | ICD-10-CM | POA: Diagnosis not present

## 2022-12-05 ENCOUNTER — Encounter: Payer: Self-pay | Admitting: Adult Health

## 2022-12-05 ENCOUNTER — Ambulatory Visit (INDEPENDENT_AMBULATORY_CARE_PROVIDER_SITE_OTHER): Payer: Medicare Other | Admitting: Adult Health

## 2022-12-05 VITALS — BP 132/70 | HR 73 | Temp 98.7°F | Ht 65.0 in | Wt 214.8 lb

## 2022-12-05 DIAGNOSIS — R0683 Snoring: Secondary | ICD-10-CM

## 2022-12-05 NOTE — Addendum Note (Signed)
Addended by: Loma Sousa on: 12/05/2022 02:12 PM   Modules accepted: Orders
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
## 2022-12-05 NOTE — Patient Instructions (Addendum)
Set up for home sleep study ([place] office)  Work on healthy weight loss  Do not drive if sleep  Healthy sleep regimen  Follow up in 3 months to discuss sleep study results and treatment plan (Virtual visit )

## 2022-12-05 NOTE — Assessment & Plan Note (Signed)
Snoring, restless sleep, daytime sleepiness, headaches, teeth grinding all suspicious for underlying sleep apnea.  Patient education was given on sleep apnea.  Will set patient up for home sleep study.  Plan  Patient Instructions  Set up for home sleep study ([place] office)  Work on healthy weight loss  Do not drive if sleep  Healthy sleep regimen  Follow up in 3 months to discuss sleep study results and treatment plan (Virtual visit )

## 2022-12-08 NOTE — Progress Notes (Signed)
Reviewed and agree with assessment/plan.   Chesley Mires, MD [facility]monary/Critical [facility] 12/08/2022, 7:21 AM Pager:  [phone]

## 2022-12-11 DIAGNOSIS — I671 Cerebral aneurysm, nonruptured: Secondary | ICD-10-CM | POA: Diagnosis not present

## 2022-12-11 DIAGNOSIS — Z6835 Body mass index (BMI) 35.0-35.9, adult: Secondary | ICD-10-CM | POA: Diagnosis not present

## 2022-12-11 DIAGNOSIS — G5731 Lesion of lateral popliteal nerve, right lower limb: Secondary | ICD-10-CM | POA: Diagnosis not present

## 2022-12-18 DIAGNOSIS — I6522 Occlusion and stenosis of left carotid artery: Secondary | ICD-10-CM | POA: Diagnosis not present

## 2022-12-18 DIAGNOSIS — I671 Cerebral aneurysm, nonruptured: Secondary | ICD-10-CM | POA: Diagnosis not present

## 2022-12-28 DIAGNOSIS — E1165 Type 2 diabetes mellitus with hyperglycemia: Secondary | ICD-10-CM | POA: Diagnosis not present

## 2022-12-28 DIAGNOSIS — R0602 Shortness of breath: Secondary | ICD-10-CM | POA: Diagnosis not present

## 2022-12-28 DIAGNOSIS — Z6835 Body mass index (BMI) 35.0-35.9, adult: Secondary | ICD-10-CM | POA: Diagnosis not present

## 2022-12-28 DIAGNOSIS — J45909 Unspecified asthma, uncomplicated: Secondary | ICD-10-CM | POA: Diagnosis not present

## 2022-12-28 DIAGNOSIS — E782 Mixed hyperlipidemia: Secondary | ICD-10-CM | POA: Diagnosis not present

## 2022-12-28 DIAGNOSIS — R5383 Other fatigue: Secondary | ICD-10-CM | POA: Diagnosis not present

## 2022-12-28 DIAGNOSIS — E7849 Other hyperlipidemia: Secondary | ICD-10-CM | POA: Diagnosis not present

## 2022-12-28 DIAGNOSIS — D509 Iron deficiency anemia, unspecified: Secondary | ICD-10-CM | POA: Diagnosis not present

## 2022-12-28 DIAGNOSIS — R030 Elevated blood-pressure reading, without diagnosis of hypertension: Secondary | ICD-10-CM | POA: Diagnosis not present

## 2022-12-30 DIAGNOSIS — G609 Hereditary and idiopathic neuropathy, unspecified: Secondary | ICD-10-CM | POA: Diagnosis not present

## 2022-12-30 DIAGNOSIS — M5417 Radiculopathy, lumbosacral region: Secondary | ICD-10-CM | POA: Diagnosis not present

## 2023-01-05 ENCOUNTER — Inpatient Hospital Stay: Payer: Medicare Other

## 2023-01-05 DIAGNOSIS — M170 Bilateral primary osteoarthritis of knee: Secondary | ICD-10-CM | POA: Diagnosis not present

## 2023-01-08 ENCOUNTER — Inpatient Hospital Stay: Payer: Medicare Other | Attending: Hematology

## 2023-01-08 DIAGNOSIS — D509 Iron deficiency anemia, unspecified: Secondary | ICD-10-CM | POA: Diagnosis not present

## 2023-01-08 DIAGNOSIS — N183 Chronic kidney disease, stage 3 unspecified: Secondary | ICD-10-CM | POA: Insufficient documentation

## 2023-01-08 DIAGNOSIS — D500 Iron deficiency anemia secondary to blood loss (chronic): Secondary | ICD-10-CM

## 2023-01-08 DIAGNOSIS — D631 Anemia in chronic kidney disease: Secondary | ICD-10-CM | POA: Diagnosis not present

## 2023-01-08 LAB — CBC WITH DIFFERENTIAL/PLATELET
Abs Immature Granulocytes: 0.05 10*3/uL (ref 0.00–0.07)
Basophils Absolute: 0 10*3/uL (ref 0.0–0.1)
Basophils Relative: 0 %
Eosinophils Absolute: 0.1 10*3/uL (ref 0.0–0.5)
Eosinophils Relative: 1 %
HCT: 39 % (ref 36.0–46.0)
Hemoglobin: 12.3 g/dL (ref 12.0–15.0)
Immature Granulocytes: 1 %
Lymphocytes Relative: 12 %
Lymphs Abs: 1.1 10*3/uL (ref 0.7–4.0)
MCH: 30.6 pg (ref 26.0–34.0)
MCHC: 31.5 g/dL (ref 30.0–36.0)
MCV: 97 fL (ref 80.0–100.0)
Monocytes Absolute: 0.7 10*3/uL (ref 0.1–1.0)
Monocytes Relative: 7 %
Neutro Abs: 7.4 10*3/uL (ref 1.7–7.7)
Neutrophils Relative %: 79 %
Platelets: 275 10*3/uL (ref 150–400)
RBC: 4.02 MIL/uL (ref 3.87–5.11)
RDW: 15.7 % — ABNORMAL HIGH (ref 11.5–15.5)
WBC: 9.3 10*3/uL (ref 4.0–10.5)
nRBC: 0 % (ref 0.0–0.2)

## 2023-01-08 LAB — COMPREHENSIVE METABOLIC PANEL
ALT: 18 U/L (ref 0–44)
AST: 15 U/L (ref 15–41)
Albumin: 3.8 g/dL (ref 3.5–5.0)
Alkaline Phosphatase: 56 U/L (ref 38–126)
Anion gap: 5 (ref 5–15)
BUN: 23 mg/dL (ref 8–23)
CO2: 30 mmol/L (ref 22–32)
Calcium: 9.2 mg/dL (ref 8.9–10.3)
Chloride: 102 mmol/L (ref 98–111)
Creatinine, Ser: 1.09 mg/dL — ABNORMAL HIGH (ref 0.44–1.00)
GFR, Estimated: 54 mL/min — ABNORMAL LOW (ref >60)
Glucose, Bld: 119 mg/dL — ABNORMAL HIGH (ref 70–99)
Potassium: 3.7 mmol/L (ref 3.5–5.1)
Sodium: 137 mmol/L (ref 135–145)
Total Bilirubin: 0.9 mg/dL (ref 0.3–1.2)
Total Protein: 6.9 g/dL (ref 6.5–8.1)

## 2023-01-08 LAB — IRON AND TIBC
Iron: 113 ug/dL (ref 28–170)
Saturation Ratios: 30 % (ref 10.4–31.8)
TIBC: 382 ug/dL (ref 250–450)
UIBC: 269 ug/dL

## 2023-01-08 LAB — FERRITIN: Ferritin: 18 ng/mL (ref 11–307)

## 2023-01-11 NOTE — Progress Notes (Addendum)
VIRTUAL VISIT via [place]   I connected with Candice [name]  on 01/12/23 at  2:00 PM by telephone and verified that I am speaking with the correct person using two identifiers.  Location: Patient: Home Provider: [facility]   I discussed the limitations, risks, security and privacy concerns of performing an evaluation and management service by telephone and the availability of in person appointments. I also discussed with the patient that there may be a patient responsible charge related to this service. The patient expressed understanding and agreed to proceed.   REASON FOR VISIT:  Follow-up for iron deficiency anemia and anemia of CKD   PRIOR THERAPY: None   CURRENT THERAPY: Intermittent IV iron infusions (Last Feraheme x 2 in November 2023)  INTERVAL HISTORY:  Candice [name] is contacted today for follow-up of her anemia.  She was last evaluated via telemedicine visit by [name] on 09/08/2022.  At today's visit, she reports feeling fair.  She denies any surgeries, hospitalizations, or changes in baseline health status since her last visit.  She felt improved energy after her Feraheme in November 2023, but reports increased fatigue and dyspnea on exertion over the past month.  She denies any obvious hematochezia, melena, or epistaxis.  She denies any pica, chest pain, palpitations, or syncope. She reports 75% energy and 100% appetite.  She states that she is maintaining a stable weight.  REVIEW OF SYSTEMS:   Review of Systems  Constitutional:  Negative for chills, diaphoresis, fever, malaise/fatigue and weight loss.  Respiratory:  Positive for cough and shortness of breath.   Cardiovascular:  Negative for chest pain and palpitations.  Gastrointestinal:  Negative for abdominal pain, blood in stool, melena, nausea and vomiting.  Neurological:  Positive for tingling. Negative for dizziness and headaches.      PHYSICAL EXAM: (per limitations of virtual telephone visit)  The patient is alert and oriented x 3, exhibiting adequate mentation, good mood, and ability to speak in full sentences and execute sound judgement.  ASSESSMENT & PLAN:  1.  Anemia due to iron deficiency and kidney disease:  - Etiology is possible chronic bleeding from the small bowel, relative iron deficiency and CKD. - CBC on 05/20/2021 showed Hgb 7.2 - was transfused PRBC x1 - She has had previous heme-positive stool and reports intermittent rectal bleeding   - EGD (10/14/2021): Large hiatal hernia with Lysbeth Galas ulcers, nonbleeding angiodysplastic lesion in stomach treated with APC, nonbleeding angiodysplastic lesions in duodenum x5 treated with APC - Colonoscopy (10/14/2021): Polyps, diverticulosis, nonbleeding internal hemorrhoids - SPEP/IFE in May 2021 was negative for monoclonal gammopathy - CKD stage III with baseline creatinine 1.3-1.6 (follows with Dr. Theador Hawthorne) - Anemia noted to be macrocytic, but vitamin B12 and TSH are normal may be element of early MDS or occult liver disease. - She failed to improve on oral iron supplementation and experienced severe constipation - Last Feraheme x2 in November 2023 - Most recent labs (01/08/2023): Hgb 12.3/MCV 97.0, ferritin 18, iron saturation 30%.  Creatinine 1.09/GFR 54. - Symptomatic with fatigue and dyspnea on exertion - PLAN: Recommend IV Feraheme x2 - Repeat labs and RTC in 4 months with OFFICE visit - Continue follow-up with GI - If she has Hgb < 10.0 despite iron repletion, would consider starting her on ESA injections.  PLAN SUMMARY: >> IV Feraheme x 2 >> Labs in 4 months = CBC/D, CMP, ferritin, iron/TIBC, B12, MMA >> OFFICE visit in 4 months (  1 week after labs)     I discussed the assessment and treatment plan with the patient. The patient was provided an opportunity to ask questions and all were answered. The patient agreed with the plan and demonstrated an understanding  of the instructions.   The patient was advised to call back or seek an in-person evaluation if the symptoms worsen or if the condition fails to improve as anticipated.  I provided 18 minutes of non-face-to-face time during this encounter.  Harriett Rush, PA-C 01/12/23 2:26 PM

## 2023-01-12 ENCOUNTER — Inpatient Hospital Stay: Payer: Medicare Other | Attending: Physician Assistant | Admitting: Physician Assistant

## 2023-01-12 DIAGNOSIS — R5383 Other fatigue: Secondary | ICD-10-CM | POA: Diagnosis not present

## 2023-01-12 DIAGNOSIS — N1831 Chronic kidney disease, stage 3a: Secondary | ICD-10-CM

## 2023-01-12 DIAGNOSIS — D509 Iron deficiency anemia, unspecified: Secondary | ICD-10-CM | POA: Diagnosis not present

## 2023-01-12 DIAGNOSIS — N183 Chronic kidney disease, stage 3 unspecified: Secondary | ICD-10-CM | POA: Diagnosis not present

## 2023-01-12 DIAGNOSIS — D631 Anemia in chronic kidney disease: Secondary | ICD-10-CM | POA: Insufficient documentation

## 2023-01-12 DIAGNOSIS — D500 Iron deficiency anemia secondary to blood loss (chronic): Secondary | ICD-10-CM

## 2023-01-15 DIAGNOSIS — M5416 Radiculopathy, lumbar region: Secondary | ICD-10-CM | POA: Diagnosis not present

## 2023-01-15 DIAGNOSIS — Z6835 Body mass index (BMI) 35.0-35.9, adult: Secondary | ICD-10-CM | POA: Diagnosis not present

## 2023-01-15 DIAGNOSIS — I671 Cerebral aneurysm, nonruptured: Secondary | ICD-10-CM | POA: Diagnosis not present

## 2023-01-15 NOTE — Progress Notes (Signed)
Pharmacy has substituted cetirizine 10 mg orally x 1 as premedication for   Loratidine discontinued.  V.O. Dr Rhys Martini, PharmD

## 2023-01-17 ENCOUNTER — Inpatient Hospital Stay: Payer: Medicare Other

## 2023-01-17 VITALS — BP 130/63 | HR 64 | Temp 98.0°F | Resp 18

## 2023-01-17 DIAGNOSIS — D500 Iron deficiency anemia secondary to blood loss (chronic): Secondary | ICD-10-CM

## 2023-01-17 DIAGNOSIS — D631 Anemia in chronic kidney disease: Secondary | ICD-10-CM | POA: Diagnosis not present

## 2023-01-17 DIAGNOSIS — D509 Iron deficiency anemia, unspecified: Secondary | ICD-10-CM | POA: Diagnosis not present

## 2023-01-17 DIAGNOSIS — N183 Chronic kidney disease, stage 3 unspecified: Secondary | ICD-10-CM | POA: Diagnosis not present

## 2023-01-17 MED ORDER — SODIUM CHLORIDE 0.9 % IV SOLN: Freq: Once | INTRAVENOUS | Status: AC

## 2023-01-17 MED ORDER — SODIUM CHLORIDE 0.9 % IV SOLN
510.0000 mg | Freq: Once | INTRAVENOUS | Status: AC
  Administered 2023-01-17: 510 mg via INTRAVENOUS
  Filled 2023-01-17: qty 510

## 2023-01-17 MED ORDER — ACETAMINOPHEN 325 MG PO TABS
650.0000 mg | ORAL_TABLET | Freq: Once | ORAL | Status: AC
  Administered 2023-01-17: 650 mg via ORAL
  Filled 2023-01-17: qty 2

## 2023-01-17 NOTE — Progress Notes (Signed)
Pt presents today for Feraheme IV iron infusion per provider's order. Vital signs stable and pt voiced no new complaints at this time.  Pt took Claritin at home prior to arrival. Peripheral IV started with good blood return pre and post infusion.  Feraheme IV given today per MD orders. Tolerated infusion without adverse affects. Vital signs stable. No complaints at this time. Discharged from clinic ambulatory in stable condition. Alert and oriented x 3. F/U with [facility] as scheduled.

## 2023-01-17 NOTE — Patient Instructions (Signed)
[facility]  Discharge Instructions: Thank you for choosing [facility] to provide your oncology and hematology care.  If you have a lab appointment with the [facility], please come in thru the Main Entrance and check in at the main information desk.  Wear comfortable clothing and clothing appropriate for easy access to any Portacath or PICC line.   We strive to give you quality time with your provider. You may need to reschedule your appointment if you arrive late (15 or more minutes).  Arriving late affects you and other patients whose appointments are after yours.  Also, if you miss three or more appointments without notifying the office, you may be dismissed from the clinic at the provider's discretion.      For prescription refill requests, have your pharmacy contact our office and allow 72 hours for refills to be completed.    Today you received Feraheme IV iron infusion   BELOW ARE SYMPTOMS THAT SHOULD BE REPORTED IMMEDIATELY: *FEVER GREATER THAN 100.4 F (38 C) OR HIGHER *CHILLS OR SWEATING *NAUSEA AND VOMITING THAT IS NOT CONTROLLED WITH YOUR NAUSEA MEDICATION *UNUSUAL SHORTNESS OF BREATH *UNUSUAL BRUISING OR BLEEDING *URINARY PROBLEMS (pain or burning when urinating, or frequent urination) *BOWEL PROBLEMS (unusual diarrhea, constipation, pain near the anus) TENDERNESS IN MOUTH AND THROAT WITH OR WITHOUT PRESENCE OF ULCERS (sore throat, sores in mouth, or a toothache) UNUSUAL RASH, SWELLING OR PAIN  UNUSUAL VAGINAL DISCHARGE OR ITCHING   Items with * indicate a potential emergency and should be followed up as soon as possible or go to the Emergency Department if any problems should occur.  Please show the CHEMOTHERAPY ALERT CARD or IMMUNOTHERAPY ALERT CARD at check-in to the Emergency Department and triage nurse.  Should you have questions after your visit or need to cancel or reschedule your appointment, please contact [facility] [phone]  and follow the prompts.  Office hours are 8:00 a.m. to 4:30 p.m. Monday - Friday. Please note that voicemails left after 4:00 p.m. may not be returned until the following business day.  We are closed weekends and major holidays. You have access to a nurse at all times for urgent questions. Please call the main number to the clinic [phone] and follow the prompts.  For any non-urgent questions, you may also contact your provider using MyChart. We now offer e-Visits for anyone 16 and older to request care online for non-urgent symptoms. For details visit [email or web address].   Also download the MyChart app! Go to the app store, search "MyChart", open the app, select [facility], and log in with your MyChart username and password.

## 2023-01-24 ENCOUNTER — Inpatient Hospital Stay: Payer: Medicare Other

## 2023-01-24 VITALS — BP 127/64 | HR 65 | Temp 97.8°F | Resp 19

## 2023-01-24 DIAGNOSIS — N183 Chronic kidney disease, stage 3 unspecified: Secondary | ICD-10-CM | POA: Diagnosis not present

## 2023-01-24 DIAGNOSIS — D631 Anemia in chronic kidney disease: Secondary | ICD-10-CM | POA: Diagnosis not present

## 2023-01-24 DIAGNOSIS — D509 Iron deficiency anemia, unspecified: Secondary | ICD-10-CM | POA: Diagnosis not present

## 2023-01-24 DIAGNOSIS — D500 Iron deficiency anemia secondary to blood loss (chronic): Secondary | ICD-10-CM

## 2023-01-24 MED ORDER — SODIUM CHLORIDE 0.9 % IV SOLN: Freq: Once | INTRAVENOUS | Status: AC

## 2023-01-24 MED ORDER — SODIUM CHLORIDE 0.9 % IV SOLN
510.0000 mg | Freq: Once | INTRAVENOUS | Status: AC
  Administered 2023-01-24: 510 mg via INTRAVENOUS
  Filled 2023-01-24: qty 510

## 2023-01-24 NOTE — Progress Notes (Signed)
Patient presents today for Feraheme infusion. MAR reviewed and updated. Vital signs stable. Patient has no complaints of any changes since her last visit. Patient takes Tylenol 650 mg and Claritin 10 mg PO prior to arrival.   [facility] given today per MD orders. Tolerated infusion without adverse affects. Vital signs stable. No complaints at this time. Discharged from clinic ambulatory in stable condition. Alert and oriented x 3. F/U with [facility] as scheduled.

## 2023-01-24 NOTE — Patient Instructions (Signed)
[facility]  Discharge Instructions: Thank you for choosing [facility] to provide your oncology and hematology care.  If you have a lab appointment with the Cancer Center, please come in thru the Main Entrance and check in at the main information desk.  Wear comfortable clothing and clothing appropriate for easy access to any Portacath or PICC line.   We strive to give you quality time with your provider. You may need to reschedule your appointment if you arrive late (15 or more minutes).  Arriving late affects you and other patients whose appointments are after yours.  Also, if you miss three or more appointments without notifying the office, you may be dismissed from the clinic at the provider's discretion.      For prescription refill requests, have your pharmacy contact our office and allow 72 hours for refills to be completed.    Today you received the following chemotherapy and/or immunotherapy agents Feraheme. Ferumoxytol Injection What is this medication? FERUMOXYTOL (FER ue MOX i tol) treats low levels of iron in your body (iron deficiency anemia). Iron is a mineral that plays an important role in making red blood cells, which carry oxygen from your lungs to the rest of your body. This medicine may be used for other purposes; ask your health care provider or pharmacist if you have questions. COMMON BRAND NAME(S): Feraheme What should I tell my care team before I take this medication? They need to know if you have any of these conditions: Anemia not caused by low iron levels High levels of iron in the blood Magnetic resonance imaging (MRI) test scheduled An unusual or allergic reaction to iron, other medications, foods, dyes, or preservatives Pregnant or trying to get pregnant Breastfeeding How should I use this medication? This medication is injected into a vein. It is given by your care team in a hospital or clinic setting. Talk to your care  team the use of this medication in children. Special care may be needed. Overdosage: If you think you have taken too much of this medicine contact a poison control center or emergency room at once. NOTE: This medicine is only for you. Do not share this medicine with others. What if I miss a dose? It is important not to miss your dose. Call your care team if you are unable to keep an appointment. What may interact with this medication? Other iron products This list may not describe all possible interactions. Give your health care provider a list of all the medicines, herbs, non-prescription drugs, or dietary supplements you use. Also tell them if you smoke, drink alcohol, or use illegal drugs. Some items may interact with your medicine. What should I watch for while using this medication? Visit your care team regularly. Tell your care team if your symptoms do not start to get better or if they get worse. You may need blood work done while you are taking this medication. You may need to follow a special diet. Talk to your care team. Foods that contain iron include: whole grains/cereals, dried fruits, beans, or peas, leafy green vegetables, and organ meats (liver, kidney). What side effects may I notice from receiving this medication? Side effects that you should report to your care team as soon as possible: Allergic reactions--skin rash, itching, hives, swelling of the face, lips, tongue, or throat Low blood pressure--dizziness, feeling faint or lightheaded, blurry vision Shortness of breath Side effects that usually do not require medical attention (report to your   care team if they continue or are bothersome): Flushing Headache Joint pain Muscle pain Nausea Pain, redness, or irritation at injection site This list may not describe all possible side effects. Call your doctor for medical advice about side effects. You may report side effects to FDA at 1-800-FDA-1088. Where should I keep my  medication? This medication is given in a hospital or clinic and will not be stored at home. NOTE: This sheet is a summary. It may not cover all possible information. If you have questions about this medicine, talk to your doctor, pharmacist, or health care provider.  2023 Elsevier/Gold Standard (2021-03-23 00:00:00)       To help prevent nausea and vomiting after your treatment, we encourage you to take your nausea medication as directed.  BELOW ARE SYMPTOMS THAT SHOULD BE REPORTED IMMEDIATELY: *FEVER GREATER THAN 100.4 F (38 C) OR HIGHER *CHILLS OR SWEATING *NAUSEA AND VOMITING THAT IS NOT CONTROLLED WITH YOUR NAUSEA MEDICATION *UNUSUAL SHORTNESS OF BREATH *UNUSUAL BRUISING OR BLEEDING *URINARY PROBLEMS (pain or burning when urinating, or frequent urination) *BOWEL PROBLEMS (unusual diarrhea, constipation, pain near the anus) TENDERNESS IN MOUTH AND THROAT WITH OR WITHOUT PRESENCE OF ULCERS (sore throat, sores in mouth, or a toothache) UNUSUAL RASH, SWELLING OR PAIN  UNUSUAL VAGINAL DISCHARGE OR ITCHING   Items with * indicate a potential emergency and should be followed up as soon as possible or go to the Emergency Department if any problems should occur.  Please show the CHEMOTHERAPY ALERT CARD or IMMUNOTHERAPY ALERT CARD at check-in to the Emergency Department and triage nurse.  Should you have questions after your visit or need to cancel or reschedule your appointment, please contact [facility] [phone]  and follow the prompts.  Office hours are 8:00 a.m. to 4:30 p.m. Monday - Friday. Please note that voicemails left after 4:00 p.m. may not be returned until the following business day.  We are closed weekends and major holidays. You have access to a nurse at all times for urgent questions. Please call the main number to the clinic [phone] and follow the prompts.  For any non-urgent questions, you may also contact your provider using MyChart. We now  offer e-Visits for anyone 18 and older to request care online for non-urgent symptoms. For details visit [email or web address].   Also download the MyChart app! Go to the app store, search "MyChart", open the app, select [place], and log in with your MyChart username and password.   

## 2023-01-29 DIAGNOSIS — D500 Iron deficiency anemia secondary to blood loss (chronic): Secondary | ICD-10-CM | POA: Diagnosis not present

## 2023-01-29 DIAGNOSIS — I129 Hypertensive chronic kidney disease with stage 1 through stage 4 chronic kidney disease, or unspecified chronic kidney disease: Secondary | ICD-10-CM | POA: Diagnosis not present

## 2023-01-29 DIAGNOSIS — N189 Chronic kidney disease, unspecified: Secondary | ICD-10-CM | POA: Diagnosis not present

## 2023-01-29 DIAGNOSIS — Z79899 Other long term (current) drug therapy: Secondary | ICD-10-CM | POA: Diagnosis not present

## 2023-01-29 DIAGNOSIS — E1122 Type 2 diabetes mellitus with diabetic chronic kidney disease: Secondary | ICD-10-CM | POA: Diagnosis not present

## 2023-01-29 DIAGNOSIS — D638 Anemia in other chronic diseases classified elsewhere: Secondary | ICD-10-CM | POA: Diagnosis not present

## 2023-01-30 DIAGNOSIS — I671 Cerebral aneurysm, nonruptured: Secondary | ICD-10-CM | POA: Diagnosis not present

## 2023-01-30 DIAGNOSIS — N189 Chronic kidney disease, unspecified: Secondary | ICD-10-CM | POA: Diagnosis not present

## 2023-01-30 DIAGNOSIS — R0683 Snoring: Secondary | ICD-10-CM | POA: Diagnosis not present

## 2023-01-30 DIAGNOSIS — D500 Iron deficiency anemia secondary to blood loss (chronic): Secondary | ICD-10-CM | POA: Diagnosis not present

## 2023-01-30 DIAGNOSIS — E1122 Type 2 diabetes mellitus with diabetic chronic kidney disease: Secondary | ICD-10-CM | POA: Diagnosis not present

## 2023-01-30 DIAGNOSIS — I129 Hypertensive chronic kidney disease with stage 1 through stage 4 chronic kidney disease, or unspecified chronic kidney disease: Secondary | ICD-10-CM | POA: Diagnosis not present

## 2023-02-01 DIAGNOSIS — M4726 Other spondylosis with radiculopathy, lumbar region: Secondary | ICD-10-CM | POA: Diagnosis not present

## 2023-02-01 DIAGNOSIS — M4807 Spinal stenosis, lumbosacral region: Secondary | ICD-10-CM | POA: Diagnosis not present

## 2023-02-20 DIAGNOSIS — M11862 Other specified crystal arthropathies, left knee: Secondary | ICD-10-CM | POA: Diagnosis not present

## 2023-02-20 DIAGNOSIS — M11861 Other specified crystal arthropathies, right knee: Secondary | ICD-10-CM | POA: Diagnosis not present

## 2023-02-26 DIAGNOSIS — M23222 Derangement of posterior horn of medial meniscus due to old tear or injury, left knee: Secondary | ICD-10-CM | POA: Diagnosis not present

## 2023-02-26 DIAGNOSIS — M23204 Derangement of unspecified medial meniscus due to old tear or injury, left knee: Secondary | ICD-10-CM | POA: Diagnosis not present

## 2023-02-26 DIAGNOSIS — M948X6 Other specified disorders of cartilage, lower leg: Secondary | ICD-10-CM | POA: Diagnosis not present

## 2023-02-27 DIAGNOSIS — Z6836 Body mass index (BMI) 36.0-36.9, adult: Secondary | ICD-10-CM | POA: Diagnosis not present

## 2023-02-27 DIAGNOSIS — J45909 Unspecified asthma, uncomplicated: Secondary | ICD-10-CM | POA: Diagnosis not present

## 2023-02-27 DIAGNOSIS — F411 Generalized anxiety disorder: Secondary | ICD-10-CM | POA: Diagnosis not present

## 2023-02-27 DIAGNOSIS — Z1389 Encounter for screening for other disorder: Secondary | ICD-10-CM | POA: Diagnosis not present

## 2023-02-27 DIAGNOSIS — R5383 Other fatigue: Secondary | ICD-10-CM | POA: Diagnosis not present

## 2023-02-27 DIAGNOSIS — K2100 Gastro-esophageal reflux disease with esophagitis, without bleeding: Secondary | ICD-10-CM | POA: Diagnosis not present

## 2023-02-27 DIAGNOSIS — M79604 Pain in right leg: Secondary | ICD-10-CM | POA: Diagnosis not present

## 2023-02-27 DIAGNOSIS — I10 Essential (primary) hypertension: Secondary | ICD-10-CM | POA: Diagnosis not present

## 2023-02-27 DIAGNOSIS — M25561 Pain in right knee: Secondary | ICD-10-CM | POA: Diagnosis not present

## 2023-02-27 DIAGNOSIS — Z1331 Encounter for screening for depression: Secondary | ICD-10-CM | POA: Diagnosis not present

## 2023-02-27 DIAGNOSIS — E7849 Other hyperlipidemia: Secondary | ICD-10-CM | POA: Diagnosis not present

## 2023-02-28 DIAGNOSIS — M5416 Radiculopathy, lumbar region: Secondary | ICD-10-CM | POA: Diagnosis not present

## 2023-03-05 ENCOUNTER — Telehealth: Payer: Self-pay | Admitting: *Deleted

## 2023-03-05 NOTE — Telephone Encounter (Signed)
Called and spoke with the patient regarding the HST, she stated she has not had it done.  I let her know that one of our PCCS had tried to reach her, but was unsuccessful.  I advised her that I would cancel her video visit for tomorrow and request that one of the PCCS call to get her scheduled at the [facility] office and once she has the HST to call and schedule a video visit for 2 weeks out to review.  She verbalized understanding.

## 2023-03-06 ENCOUNTER — Telehealth: Payer: Medicare Other | Admitting: Adult Health

## 2023-03-08 ENCOUNTER — Ambulatory Visit: Payer: Medicare Other

## 2023-03-08 DIAGNOSIS — G4733 Obstructive sleep apnea (adult) (pediatric): Secondary | ICD-10-CM

## 2023-03-08 DIAGNOSIS — R0683 Snoring: Secondary | ICD-10-CM

## 2023-03-13 DIAGNOSIS — G4733 Obstructive sleep apnea (adult) (pediatric): Secondary | ICD-10-CM | POA: Diagnosis not present

## 2023-03-26 ENCOUNTER — Telehealth (INDEPENDENT_AMBULATORY_CARE_PROVIDER_SITE_OTHER): Payer: Medicare Other | Admitting: Adult Health

## 2023-03-26 DIAGNOSIS — R0902 Hypoxemia: Secondary | ICD-10-CM | POA: Diagnosis not present

## 2023-03-26 DIAGNOSIS — G4733 Obstructive sleep apnea (adult) (pediatric): Secondary | ICD-10-CM | POA: Diagnosis not present

## 2023-03-26 NOTE — Progress Notes (Signed)
Virtual Visit via Video Note  I connected with Candice [name] on 03/26/23 at  1:30 PM EDT by a video enabled telemedicine application and verified that I am speaking with the correct person using two identifiers.  Location: Patient: Home  Provider: Office    I discussed the limitations of evaluation and management by telemedicine and the availability of in person appointments. The patient expressed understanding and agreed to proceed.  History of Present Illness: 72 -year-old female seen for sleep consult December 05, 2022 for snoring, daytime sleepiness found to have obstructive sleep apnea. Today's video visit is a follow-up to review home sleep study results.  Video visit was converted to telephone visit as video was clear however was unable to have any sound after multiple attempts.  Patient was seen in January 2024 for snoring and daytime sleepiness.  She was set up for home sleep study that was completed on March 08, 2023 that showed mild sleep apnea with AHI at 10.3/hour and SpO2 low at 55%, SpO2 average at 92%.  We discussed her sleep study results in detail.  Went over treatment options including weight loss and CPAP therapy.  The patient is in agreement to begin CPAP therapy.  We did discus significant nocturnal hypoxemia.  That was noted on her home sleep study.  Past Medical History:  Diagnosis Date   Anemia    Anxiety    Arthritis    Asthma    Chronic constipation    Diverticulosis of colon    Dyspnea    sob with exertion   GERD (gastroesophageal reflux disease)    Hemorrhoids    Herpes    History of lumpectomy of right breast    Hypertension    Iron deficiency anemia due to chronic blood loss 06/29/2021   Major depression    Rectocele    W/ POSSIBLE CYSTOCELE      Observations/Objective: Home sleep study March 08, 2023 showed mild sleep apnea with AHI at 10.8/hour and SpO2 low at 55%, average SpO2 92%  Assessment and Plan: Mild obstructive sleep apnea with  associated significant nocturnal hypoxemia.-Will need to set patient up for CPAP therapy.  Will begin CPAP AutoSet 5 to 20 cm H2O.  Patient education given on sleep apnea and CPAP care. Degree of nocturnal hypoxemia will need an in lab CPAP titration study.  Plan  Patient Instructions  Begin CPAP at bedtime, goal was to wear all night long for at least 6 or more hours Work on healthy weight loss Do not drive if sleep  Healthy sleep regimen  Set up for CPAP titration study [facility]) Follow up in 6 weeks with Dr. Craige Cotta  or Darnesha Diloreto NP        Follow Up Instructions:    I discussed the assessment and treatment plan with the patient. The patient was provided an opportunity to ask questions and all were answered. The patient agreed with the plan and demonstrated an understanding of the instructions.   The patient was advised to call back or seek an in-person evaluation if the symptoms worsen or if the condition fails to improve as anticipated.  I provided 22 minutes of non-face-to-face time during this encounter.   Rubye Oaks, NP

## 2023-03-26 NOTE — Progress Notes (Signed)
Reviewed and agree with assessment/plan.   Coralyn Helling, MD [facility] Pulmonary/Critical Care 03/26/2023, 2:23 PM Pager:  [phone]

## 2023-03-26 NOTE — Patient Instructions (Addendum)
Begin CPAP at bedtime, goal was to wear all night long for at least 6 or more hours Work on healthy weight loss Do not drive if sleep  Healthy sleep regimen  Set up for CPAP titration study [facility]) Follow up in 6 weeks with Dr. Craige Cotta  or Maytal Mijangos NP  and As needed

## 2023-03-27 ENCOUNTER — Telehealth: Payer: Self-pay | Admitting: Adult Health

## 2023-03-27 NOTE — Telephone Encounter (Signed)
Spoke with the pt  She had many questions about her HST results that TP went over with her  I answered her questions to the best of my ability  She states she also has SOB in the daytime and wants o2 during the day  I have scheduled her acute visit with Dr Craige Cotta in [place] for 04/03/23.  I advised her of office location  Nothing further needed

## 2023-03-27 NOTE — Telephone Encounter (Signed)
Patient would like to discuss CPAP machine/ Also would like to discuss low oxygen levels. Patient phone number is [phone] and [phone].

## 2023-04-03 ENCOUNTER — Encounter (HOSPITAL_BASED_OUTPATIENT_CLINIC_OR_DEPARTMENT_OTHER): Payer: Self-pay | Admitting: Pulmonary Disease

## 2023-04-03 ENCOUNTER — Ambulatory Visit (INDEPENDENT_AMBULATORY_CARE_PROVIDER_SITE_OTHER): Payer: Medicare Other | Admitting: Pulmonary Disease

## 2023-04-03 ENCOUNTER — Ambulatory Visit (HOSPITAL_BASED_OUTPATIENT_CLINIC_OR_DEPARTMENT_OTHER): Payer: Medicare Other

## 2023-04-03 VITALS — BP 156/88 | HR 77 | Temp 97.9°F | Ht 66.0 in | Wt 215.2 lb

## 2023-04-03 DIAGNOSIS — J9811 Atelectasis: Secondary | ICD-10-CM | POA: Diagnosis not present

## 2023-04-03 DIAGNOSIS — R0609 Other forms of dyspnea: Secondary | ICD-10-CM | POA: Diagnosis not present

## 2023-04-03 DIAGNOSIS — R0600 Dyspnea, unspecified: Secondary | ICD-10-CM | POA: Diagnosis not present

## 2023-04-03 DIAGNOSIS — J42 Unspecified chronic bronchitis: Secondary | ICD-10-CM | POA: Diagnosis not present

## 2023-04-03 DIAGNOSIS — G4733 Obstructive sleep apnea (adult) (pediatric): Secondary | ICD-10-CM | POA: Diagnosis not present

## 2023-04-03 DIAGNOSIS — K449 Diaphragmatic hernia without obstruction or gangrene: Secondary | ICD-10-CM | POA: Diagnosis not present

## 2023-04-03 DIAGNOSIS — R0902 Hypoxemia: Secondary | ICD-10-CM | POA: Diagnosis not present

## 2023-04-03 MED ORDER — IPRATROPIUM-ALBUTEROL 0.5-2.5 (3) MG/3ML IN SOLN: 3.0000 mL | Freq: Four times a day (QID) | RESPIRATORY_TRACT | 1 refills | Status: DC | PRN

## 2023-04-03 MED ORDER — FLUTICASONE PROPIONATE 50 MCG/ACT NA SUSP: 1.0000 | Freq: Every evening | NASAL | 2 refills | Status: AC

## 2023-04-03 NOTE — Patient Instructions (Signed)
Will arrange for a home nebulizer.  Use albuterol/ipratropium in nebulizer every 6 hours as needed for cough, wheeze, chest congestion or shortness of breath.  Flonase 1 spray in each nostril nightly.  Stop using your advair and trelegy.  Chest xray today.  Will schedule echocardiogram.  Will schedule pulmonary function test and follow up after this.

## 2023-04-03 NOTE — Progress Notes (Signed)
Pulmonary, Critical Care, and Sleep Medicine  Chief Complaint  Patient presents with   Follow-up    Patient is here to talk about sob.     Past Surgical History:  She  has a past surgical history that includes Hemorrhoid surgery (1970); Colonoscopy (10/30/2012); Esophagogastroduodenoscopy (10/30/2012); Colonoscopy (N/A, 07/24/2017); Breast surgery (Right, 01/17/2007   dr tim davis); NEEDLE-GUIDED EXCISION RIGHT BREAST MASS (07-13-2011  dr Jamey Ripa); Vaginal hysterectomy; Rectocele repair (N/A, 08/14/2017); IR ANGIO VERTEBRAL SEL VERTEBRAL UNI R MOD SED (07/04/2019); IR ANGIO INTRA EXTRACRAN SEL COM CAROTID INNOMINATE UNI L MOD SED (07/04/2019); IR ANGIO INTRA EXTRACRAN SEL INTERNAL CAROTID UNI R MOD SED (07/04/2019); IR US Guide Vasc Access Right (07/04/2019); Fracture surgery (1970); Radiology with anesthesia (N/A, 07/29/2019); IR Transcath/Emboliz (07/29/2019); IR Angiogram Follow Up Study (07/29/2019); IR ANGIO INTRA EXTRACRAN SEL INTERNAL CAROTID UNI L MOD SED (07/29/2019); IR ANGIO INTRA EXTRACRAN SEL COM CAROTID INNOMINATE UNI L MOD SED (10/14/2019); IR US Guide Vasc Access Right (10/14/2019); Thyroidectomy (Left, 05/11/2020); IR ANGIO INTRA EXTRACRAN SEL COM CAROTID INNOMINATE BILAT MOD SED (09/09/2020); IR US Guide Vasc Access Right (09/09/2020); Cataract extraction w/PHACO (Right, 10/15/2020); Cataract extraction (Left); Colonoscopy with propofol (N/A, 10/14/2021); Esophagogastroduodenoscopy (egd) with propofol (N/A, 10/14/2021); Hot hemostasis (10/14/2021); and polypectomy (10/14/2021).  Past Medical History:  HTN, CKD 3a, Anemia, GERD, HTN, DM type 2, 2nd hyperparathyroidism, Vit D deficiency, MGUS, Cerebral aneurysm  Constitutional:  BP (!) 156/88 (BP Location: Left Arm, Patient Position: Sitting, Cuff Size: Normal)   Pulse 77   Temp 97.9 F (36.6 C) (Oral)   Ht 5\' 6"  (1.676 m)   Wt 215 lb 3.2 oz (97.6 kg)   SpO2 93%   BMI 34.73 kg/m   Brief Summary:  Candice [name] is  a 72 y.o. female former smoker with obstructive sleep apnea and shortness of breath.      Subjective:   She was seen by Rubye Oaks for obstructive sleep apnea.  She was found have mild sleep apnea, but significant oxygen desaturation.  She has auto CPAP set up pending and will be scheduled for a CPAP titration study.  She has trouble with her breathing during the day.  She makes funny noises with her breathing and feels like her throat closes.  This happens with strong smells or when it is hot.  Chest gets sinus congestion and post nasal drip.  Also has reflux.  She has tried symbicort, advair and trelegy.  None of these seemed to help.  She gets winded with walking.    Physical Exam:   Appearance - well kempt   ENMT - no sinus tenderness, no oral exudate, no LAN, Mallampati 4 airway, no stridor, faint wheeze over neck area  Respiratory - equal breath sounds bilaterally, no wheezing or rales  CV - s1s2 regular rate and rhythm, no murmurs  Ext - no clubbing, no edema  Skin - no rashes  Psych - normal mood and affect   Pulmonary testing:    Chest Imaging:    Sleep Tests:  HST 03/08/23 >> AHI 10.3, SpO2 low 55%  Cardiac Tests:    Social History:  She  reports that she has quit smoking. She has been exposed to tobacco smoke. She has never used smokeless tobacco. She reports current alcohol use. She reports that she does not use drugs.  Family History:  Her family history is not on file.    Discussion:  She has mild obstructive sleep apnea, but significant oxygen desaturation.  She has  dyspnea on exertion and seems to have upper airway instability with mild intermittent stridor.  She has post nasal drip and reflux.  She hasn't noticed benefit from inhaler therapy.  Assessment/Plan:   Obstructive sleep apnea. - reviewed her sleep study - discussed how sleep apnea can impact her health - treatment options discussed  - CPAP ordered May 2024 - she will be getting  auto CPAP set up later this week  - CPAP titration study scheduled to determine if she needs supplemental oxygen with CPAP  Dyspnea on exertion. - likely from obesity with deconditioning, HFpEF, vocal cord dysfunction in setting of post nasal drip and reflux, and possibly asthma - will have her stop advair and trelegy since these haven't helped and DPI might be contributing to upper airway irritation - will arrange for a home nebulizer and prn duoneb - will have her use flonase 1 spray in each nostril nightly - continue pantoprazole 40 mg bid from her PCP; might need further assessment from gastroenterology - will get chest xray, pulmonary function test, and echocardiogram - she likely would benefit from a monitored exercise program  Time Spent Involved in Patient Care on Day of Examination:  56 minutes  Follow up:   Patient Instructions  Will arrange for a home nebulizer.  Use albuterol/ipratropium in nebulizer every 6 hours as needed for cough, wheeze, chest congestion or shortness of breath.  Flonase 1 spray in each nostril nightly.  Stop using your advair and trelegy.  Chest xray today.  Will schedule echocardiogram.  Will schedule pulmonary function test and follow up after this.  Medication List:   Allergies as of 04/03/2023       Reactions   Codeine Nausea Only   Pholcodine Itching   Elemental Sulfur Rash   Sulfa Antibiotics Rash        Medication List        Accurate as of Apr 03, 2023  2:43 PM. If you have any questions, ask your nurse or doctor.          STOP taking these medications    azithromycin 250 MG tablet Commonly known as: ZITHROMAX Stopped by: Coralyn Helling, MD   budesonide-formoterol 160-4.5 MCG/ACT inhaler Commonly known as: SYMBICORT Stopped by: Coralyn Helling, MD   predniSONE 10 MG (21) Tbpk tablet Commonly known as: STERAPRED UNI-PAK 21 TAB Stopped by: Coralyn Helling, MD   Trelegy Ellipta 100-62.5-25 MCG/ACT Aepb Generic drug:  Fluticasone-Umeclidin-Vilant Stopped by: Coralyn Helling, MD       TAKE these medications    acetaminophen 650 MG CR tablet Commonly known as: TYLENOL Take 1,300 mg by mouth in the morning and at bedtime.   acyclovir 400 MG tablet Commonly known as: ZOVIRAX Take 1 tablet by mouth daily.   acyclovir 400 MG tablet Commonly known as: ZOVIRAX Take 400 mg by mouth daily as needed (outbreak).   BLACK CURRANT SEED OIL PO Take 5 mLs by mouth daily. Cold Pressed Black Seed Oil   carvedilol 12.5 MG tablet Commonly known as: COREG   celecoxib 200 MG capsule Commonly known as: CELEBREX Take 200 mg by mouth daily.   citalopram 20 MG tablet Commonly known as: CELEXA Take 20 mg by mouth daily. In the morning   clonazePAM 0.5 MG tablet Commonly known as: KLONOPIN Take 0.25 mg by mouth at bedtime.   COQ-10 PO Take 15 mLs by mouth daily.   cyanocobalamin 500 MCG tablet Commonly known as: VITAMIN B12 Take 500 mcg by mouth daily.   ezetimibe  10 MG tablet Commonly known as: ZETIA   fluticasone 50 MCG/ACT nasal spray Commonly known as: FLONASE Place 1 spray into both nostrils at bedtime. Started by: Coralyn Helling, MD   gabapentin 300 MG capsule Commonly known as: NEURONTIN Take 1 capsule by mouth at bedtime.   HAIR/SKIN/NAILS PO Take 1 tablet by mouth daily.   ipratropium-albuterol 0.5-2.5 (3) MG/3ML Soln Commonly known as: DUONEB Take 3 mLs by nebulization every 6 (six) hours as needed. Started by: Coralyn Helling, MD   linaclotide 145 MCG Caps capsule Commonly known as: LINZESS Take 145 mcg by mouth daily as needed (for constipation).   loratadine 10 MG tablet Commonly known as: CLARITIN Take 10 mg by mouth daily.   methocarbamol 500 MG tablet Commonly known as: ROBAXIN Take 500 mg by mouth 4 (four) times daily.   Mobic 15 MG tablet Generic drug: meloxicam Take 15 mg by mouth daily.   NERVINE PO Take 1 tablet by mouth daily.   olmesartan-hydrochlorothiazide  40-12.5 MG tablet Commonly known as: BENICAR HCT Take 1 tablet by mouth daily. What changed: Another medication with the same name was removed. Continue taking this medication, and follow the directions you see here. Changed by: Coralyn Helling, MD   Osteo Bi-Flex Adv Triple St Tabs Take 2 tablets by mouth 2 (two) times daily.   pantoprazole 40 MG tablet Commonly known as: PROTONIX Take 1 tablet by mouth 2 (two) times daily. What changed: Another medication with the same name was removed. Continue taking this medication, and follow the directions you see here. Changed by: Coralyn Helling, MD   PREVAGEN PO Take 1 tablet by mouth daily.   ProAir HFA 108 (90 Base) MCG/ACT inhaler Generic drug: albuterol Take 2 puffs by mouth 4 (four) times daily as needed for wheezing or shortness of breath.   traMADol 50 MG tablet Commonly known as: ULTRAM Take 50 mg by mouth every 6 (six) hours.   Trulicity 0.75 MG/0.5ML Sopn Generic drug: Dulaglutide Inject into the skin.   Vitamin D3 10 MCG (400 UNIT) tablet Take 400 Units by mouth daily.        Signature:  Coralyn Helling, MD [facility] Pulmonary/Critical Care Pager - [phone] 04/03/2023, 2:43 PM

## 2023-04-04 ENCOUNTER — Telehealth (HOSPITAL_BASED_OUTPATIENT_CLINIC_OR_DEPARTMENT_OTHER): Payer: Self-pay | Admitting: Pulmonary Disease

## 2023-04-04 NOTE — Telephone Encounter (Signed)
Patient states pharmacy needs more information regarding solution needed for her nebulizer. Pharmacy states they can not fill her solution until then. Patient is having difficulty breathing and would like to have solution by the time she has her nebulizer so she can start using ASAP. Please advise and call patient back with an update.  Pharm: [facility] in [place] [phone]

## 2023-04-05 ENCOUNTER — Telehealth: Payer: Self-pay | Admitting: Pulmonary Disease

## 2023-04-05 NOTE — Telephone Encounter (Signed)
Patient would like medication for Nebulizer machine. Pharmacy is [facility] [place]. Patient phone number is [phone] and [phone].

## 2023-04-06 MED ORDER — IPRATROPIUM-ALBUTEROL 0.5-2.5 (3) MG/3ML IN SOLN: 3.0000 mL | Freq: Four times a day (QID) | RESPIRATORY_TRACT | 1 refills | Status: DC | PRN

## 2023-04-06 NOTE — Telephone Encounter (Signed)
Rx for nebulizer medication has been sent to the pharmacy.   Nothing further needed.

## 2023-04-11 NOTE — Telephone Encounter (Signed)
Refer to phone encounter from 5/23.

## 2023-04-17 ENCOUNTER — Telehealth: Payer: Self-pay | Admitting: Pulmonary Disease
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
## 2023-04-17 NOTE — Telephone Encounter (Signed)
She needs to go to the ER for further assessment.

## 2023-04-17 NOTE — Telephone Encounter (Signed)
Noted  

## 2023-04-17 NOTE — Telephone Encounter (Signed)
Patient states having symptom of shortness of breath. Pharmacy is [facility] [place]. Patient phone number is [phone].

## 2023-04-17 NOTE — Telephone Encounter (Signed)
Spoke with patient Advised she been seen in ED for further evaluation. Patient states she may go, but thinks she will be alright.  Routing to Dr. Craige Cotta as an Lorain Childes. I tried to convince pt to go and get checked out not sure if she will.

## 2023-04-20 DIAGNOSIS — R5383 Other fatigue: Secondary | ICD-10-CM | POA: Diagnosis not present

## 2023-04-20 DIAGNOSIS — R030 Elevated blood-pressure reading, without diagnosis of hypertension: Secondary | ICD-10-CM | POA: Diagnosis not present

## 2023-04-20 DIAGNOSIS — R413 Other amnesia: Secondary | ICD-10-CM | POA: Diagnosis not present

## 2023-04-20 DIAGNOSIS — K219 Gastro-esophageal reflux disease without esophagitis: Secondary | ICD-10-CM | POA: Diagnosis not present

## 2023-04-20 DIAGNOSIS — R0602 Shortness of breath: Secondary | ICD-10-CM | POA: Diagnosis not present

## 2023-04-20 DIAGNOSIS — Z6834 Body mass index (BMI) 34.0-34.9, adult: Secondary | ICD-10-CM | POA: Diagnosis not present

## 2023-04-20 DIAGNOSIS — K449 Diaphragmatic hernia without obstruction or gangrene: Secondary | ICD-10-CM | POA: Diagnosis not present

## 2023-05-07 ENCOUNTER — Inpatient Hospital Stay: Payer: Medicare Other | Attending: Physician Assistant

## 2023-05-07 DIAGNOSIS — D631 Anemia in chronic kidney disease: Secondary | ICD-10-CM | POA: Diagnosis not present

## 2023-05-07 DIAGNOSIS — R5383 Other fatigue: Secondary | ICD-10-CM

## 2023-05-07 DIAGNOSIS — D509 Iron deficiency anemia, unspecified: Secondary | ICD-10-CM | POA: Insufficient documentation

## 2023-05-07 DIAGNOSIS — N183 Chronic kidney disease, stage 3 unspecified: Secondary | ICD-10-CM | POA: Diagnosis not present

## 2023-05-07 DIAGNOSIS — D500 Iron deficiency anemia secondary to blood loss (chronic): Secondary | ICD-10-CM

## 2023-05-07 LAB — CBC WITH DIFFERENTIAL/PLATELET
Abs Immature Granulocytes: 0.02 10*3/uL (ref 0.00–0.07)
Basophils Absolute: 0 10*3/uL (ref 0.0–0.1)
Basophils Relative: 1 %
Eosinophils Absolute: 0.4 10*3/uL (ref 0.0–0.5)
Eosinophils Relative: 6 %
HCT: 41.6 % (ref 36.0–46.0)
Hemoglobin: 13.7 g/dL (ref 12.0–15.0)
Immature Granulocytes: 0 %
Lymphocytes Relative: 20 %
Lymphs Abs: 1.2 10*3/uL (ref 0.7–4.0)
MCH: 30.6 pg (ref 26.0–34.0)
MCHC: 32.9 g/dL (ref 30.0–36.0)
MCV: 92.9 fL (ref 80.0–100.0)
Monocytes Absolute: 0.6 10*3/uL (ref 0.1–1.0)
Monocytes Relative: 10 %
Neutro Abs: 3.9 10*3/uL (ref 1.7–7.7)
Neutrophils Relative %: 63 %
Platelets: 297 10*3/uL (ref 150–400)
RBC: 4.48 MIL/uL (ref 3.87–5.11)
RDW: 14 % (ref 11.5–15.5)
WBC: 6.2 10*3/uL (ref 4.0–10.5)
nRBC: 0 % (ref 0.0–0.2)

## 2023-05-07 LAB — COMPREHENSIVE METABOLIC PANEL
ALT: 15 U/L (ref 0–44)
AST: 19 U/L (ref 15–41)
Albumin: 4.1 g/dL (ref 3.5–5.0)
Alkaline Phosphatase: 67 U/L (ref 38–126)
Anion gap: 9 (ref 5–15)
BUN: 13 mg/dL (ref 8–23)
CO2: 28 mmol/L (ref 22–32)
Calcium: 9.8 mg/dL (ref 8.9–10.3)
Chloride: 100 mmol/L (ref 98–111)
Creatinine, Ser: 0.97 mg/dL (ref 0.44–1.00)
GFR, Estimated: >60 mL/min (ref >60)
Glucose, Bld: 91 mg/dL (ref 70–99)
Potassium: 3.1 mmol/L — ABNORMAL LOW (ref 3.5–5.1)
Sodium: 137 mmol/L (ref 135–145)
Total Bilirubin: 0.7 mg/dL (ref 0.3–1.2)
Total Protein: 7.2 g/dL (ref 6.5–8.1)

## 2023-05-07 LAB — FERRITIN: Ferritin: 110 ng/mL (ref 11–307)

## 2023-05-07 LAB — IRON AND TIBC
Iron: 64 ug/dL (ref 28–170)
Saturation Ratios: 19 % (ref 10.4–31.8)
TIBC: 338 ug/dL (ref 250–450)
UIBC: 274 ug/dL

## 2023-05-07 LAB — VITAMIN B12: Vitamin B-12: 1273 pg/mL — ABNORMAL HIGH (ref 180–914)

## 2023-05-10 LAB — METHYLMALONIC ACID, SERUM: Methylmalonic Acid, Quantitative: 154 nmol/L (ref 0–378)

## 2023-05-14 ENCOUNTER — Inpatient Hospital Stay: Payer: Medicare Other | Attending: Physician Assistant | Admitting: Physician Assistant

## 2023-05-14 DIAGNOSIS — D509 Iron deficiency anemia, unspecified: Secondary | ICD-10-CM

## 2023-05-14 DIAGNOSIS — N183 Chronic kidney disease, stage 3 unspecified: Secondary | ICD-10-CM

## 2023-05-14 DIAGNOSIS — D631 Anemia in chronic kidney disease: Secondary | ICD-10-CM | POA: Diagnosis not present

## 2023-05-14 DIAGNOSIS — D500 Iron deficiency anemia secondary to blood loss (chronic): Secondary | ICD-10-CM

## 2023-05-14 DIAGNOSIS — N1831 Chronic kidney disease, stage 3a: Secondary | ICD-10-CM

## 2023-05-14 NOTE — Progress Notes (Signed)
VIRTUAL VISIT via TELEPHONE NOTE [facility]   I connected with Candice [name]  on 05/14/23 at 10:35 AM by telephone and verified that I am speaking with the correct person using two identifiers.  Location: Patient: Home Provider: [facility]   I discussed the limitations, risks, security and privacy concerns of performing an evaluation and management service by telephone and the availability of in person appointments. I also discussed with the patient that there may be a patient responsible charge related to this service. The patient expressed understanding and agreed to proceed.   REASON FOR VISIT:  Follow-up for iron deficiency anemia and anemia of CKD   PRIOR THERAPY: None   CURRENT THERAPY: Intermittent IV iron infusions (Last Feraheme x 2 in March 2024)  INTERVAL HISTORY:  Candice [name] is contacted today for follow-up of her anemia.  She was last evaluated via telemedicine visit by Rojelio Brenner PA-C on 01/12/2023.  At today's visit, she reports feeling somewhat poorly due to some COPD/asthma exacerbation, currently being treated with home nebulizer treatments.  She continues to have "spells" of intermittent fatigue, dizziness, and nausea.  She denies any surgeries, hospitalizations, or changes in baseline health status since her last visit.  She felt improved energy after her Feraheme in March 2024, but reports increased fatigue and dyspnea on exertion over the past month as noted above.  She denies any obvious hematochezia, melena, or epistaxis.  She denies any pica, chest pain, palpitations, or syncope.  She reports 25% energy and 75% appetite.  She states that she is maintaining a stable weight.  REVIEW OF SYSTEMS:  Review of Systems  Constitutional:  Negative for chills, diaphoresis, fever, malaise/fatigue and weight loss.  Respiratory:  Positive for cough and shortness of breath (COPD/asthma exacerbation).   Cardiovascular:   Negative for chest pain and palpitations.  Gastrointestinal:  Negative for abdominal pain, blood in stool, melena, nausea and vomiting.  Neurological:  Positive for dizziness, tingling and headaches.  Psychiatric/Behavioral:  The patient has insomnia (sleep apnea, on CPAP).      PHYSICAL EXAM: (per limitations of virtual telephone visit)  The patient is alert and oriented x 3, exhibiting adequate mentation, good mood, and ability to speak in full sentences and execute sound judgement.  ASSESSMENT & PLAN:  1.  Anemia due to iron deficiency and kidney disease:  - Etiology is possible chronic bleeding from the small bowel, relative iron deficiency and CKD. - CBC on 05/20/2021 showed Hgb 7.2 - was transfused PRBC x1 - She has had previous heme-positive stool and reports intermittent rectal bleeding - EGD (10/14/2021): Large hiatal hernia with Sheria Lang ulcers, nonbleeding angiodysplastic lesion in stomach treated with APC, nonbleeding angiodysplastic lesions in duodenum x5 treated with APC - Colonoscopy (10/14/2021): Polyps, diverticulosis, nonbleeding internal hemorrhoids - SPEP/IFE in May 2021 was negative for monoclonal gammopathy - CKD stage III with baseline creatinine 1.3-1.6 (follows with Dr. Wolfgang Phoenix) - Anemia noted to be macrocytic, but vitamin B12 and TSH are normal may be element of early MDS or occult liver disease. - She failed to improve on oral iron supplementation and experienced severe constipation - Last Feraheme x2 in March 2024  - Most recent labs (05/07/2023): Hgb 13.7/MCV 92.9, ferritin 110, iron saturation 19%.  Creatinine 0.97/GFR >60.  Vitamin B12 1273, normal MMA. - Symptomatic with fatigue and dyspnea on exertion - PLAN: No indication for IV iron at this time. - Labs (CBC/D, BMP, ferritin, iron/TIBC) and RTC in 4 months  with OFFICE visit - Continue follow-up with GI  PLAN SUMMARY: >> Labs in 4 months = CBC/D, BMP, ferritin, iron/TIBC  >> OFFICE visit in 4 months (1 week  after labs)     I discussed the assessment and treatment plan with the patient. The patient was provided an opportunity to ask questions and all were answered. The patient agreed with the plan and demonstrated an understanding of the instructions.   The patient was advised to call back or seek an in-person evaluation if the symptoms worsen or if the condition fails to improve as anticipated.  I provided 22 minutes of non-face-to-face time during this encounter.  Carnella Guadalajara, PA-C 05/14/23 10:56 AM

## 2023-05-21 ENCOUNTER — Other Ambulatory Visit: Payer: Self-pay

## 2023-05-21 DIAGNOSIS — G4733 Obstructive sleep apnea (adult) (pediatric): Secondary | ICD-10-CM | POA: Diagnosis not present

## 2023-05-21 MED ORDER — IPRATROPIUM-ALBUTEROL 0.5-2.5 (3) MG/3ML IN SOLN: 3.0000 mL | Freq: Four times a day (QID) | RESPIRATORY_TRACT | 1 refills | Status: DC | PRN

## 2023-05-23 ENCOUNTER — Ambulatory Visit (HOSPITAL_COMMUNITY)
Admission: RE | Admit: 2023-05-23 | Discharge: 2023-05-23 | Disposition: A | Discharge status: Home / Self Care | Payer: Medicare Other | Source: Ambulatory Visit | Attending: Pulmonary Disease | Admitting: Pulmonary Disease

## 2023-05-23 DIAGNOSIS — R0609 Other forms of dyspnea: Secondary | ICD-10-CM | POA: Insufficient documentation

## 2023-05-23 LAB — ECHOCARDIOGRAM COMPLETE
Area-P 1/2: 3.37 cm2
S' Lateral: 3.2 cm

## 2023-05-23 NOTE — Progress Notes (Signed)
*  PRELIMINARY RESULTS* Echocardiogram 2D Echocardiogram has been performed.  Stacey Drain 05/23/2023, 4:15 PM

## 2023-05-25 ENCOUNTER — Other Ambulatory Visit (HOSPITAL_COMMUNITY): Payer: Medicare Other

## 2023-05-25 ENCOUNTER — Other Ambulatory Visit: Payer: Medicare Other

## 2023-05-31 ENCOUNTER — Ambulatory Visit: Payer: Medicare Other | Admitting: Physician Assistant

## 2023-05-31 DIAGNOSIS — Z961 Presence of intraocular lens: Secondary | ICD-10-CM | POA: Diagnosis not present

## 2023-06-01 ENCOUNTER — Other Ambulatory Visit (HOSPITAL_COMMUNITY)
Admission: RE | Admit: 2023-06-01 | Discharge: 2023-06-01 | Disposition: A | Discharge status: Home / Self Care | Payer: Medicare Other | Source: Ambulatory Visit | Attending: Nephrology | Admitting: Nephrology

## 2023-06-01 DIAGNOSIS — I129 Hypertensive chronic kidney disease with stage 1 through stage 4 chronic kidney disease, or unspecified chronic kidney disease: Secondary | ICD-10-CM | POA: Insufficient documentation

## 2023-06-01 DIAGNOSIS — D631 Anemia in chronic kidney disease: Secondary | ICD-10-CM | POA: Diagnosis not present

## 2023-06-01 DIAGNOSIS — N189 Chronic kidney disease, unspecified: Secondary | ICD-10-CM | POA: Insufficient documentation

## 2023-06-01 DIAGNOSIS — R809 Proteinuria, unspecified: Secondary | ICD-10-CM | POA: Insufficient documentation

## 2023-06-01 LAB — RENAL FUNCTION PANEL
Albumin: 4 g/dL (ref 3.5–5.0)
Anion gap: 11 (ref 5–15)
BUN: 16 mg/dL (ref 8–23)
CO2: 26 mmol/L (ref 22–32)
Calcium: 9.7 mg/dL (ref 8.9–10.3)
Chloride: 103 mmol/L (ref 98–111)
Creatinine, Ser: 0.94 mg/dL (ref 0.44–1.00)
GFR, Estimated: >60 mL/min (ref >60)
Glucose, Bld: 98 mg/dL (ref 70–99)
Phosphorus: 2.9 mg/dL (ref 2.5–4.6)
Potassium: 3.3 mmol/L — ABNORMAL LOW (ref 3.5–5.1)
Sodium: 140 mmol/L (ref 135–145)

## 2023-06-01 LAB — PROTEIN / CREATININE RATIO, URINE
Creatinine, Urine: 201 mg/dL
Protein Creatinine Ratio: 0.05 mg/mg{Cre} (ref 0.00–0.15)
Total Protein, Urine: 10 mg/dL

## 2023-06-01 LAB — CBC
HCT: 42.6 % (ref 36.0–46.0)
Hemoglobin: 13.8 g/dL (ref 12.0–15.0)
MCH: 30.5 pg (ref 26.0–34.0)
MCHC: 32.4 g/dL (ref 30.0–36.0)
MCV: 94.2 fL (ref 80.0–100.0)
Platelets: 245 10*3/uL (ref 150–400)
RBC: 4.52 MIL/uL (ref 3.87–5.11)
RDW: 14.1 % (ref 11.5–15.5)
WBC: 5.3 10*3/uL (ref 4.0–10.5)
nRBC: 0 % (ref 0.0–0.2)

## 2023-06-02 LAB — PTH, INTACT AND CALCIUM
Calcium, Total (PTH): 10.1 mg/dL (ref 8.7–10.3)
PTH: 20 pg/mL (ref 15–65)

## 2023-06-04 ENCOUNTER — Ambulatory Visit: Payer: Medicare Other | Attending: Adult Health | Admitting: Pulmonary Disease

## 2023-06-04 DIAGNOSIS — R0902 Hypoxemia: Secondary | ICD-10-CM

## 2023-06-04 DIAGNOSIS — G4733 Obstructive sleep apnea (adult) (pediatric): Secondary | ICD-10-CM | POA: Diagnosis not present

## 2023-06-05 ENCOUNTER — Telehealth: Payer: Self-pay | Admitting: Pulmonary Disease

## 2023-06-05 DIAGNOSIS — G4733 Obstructive sleep apnea (adult) (pediatric): Secondary | ICD-10-CM

## 2023-06-05 NOTE — Procedures (Signed)
     Patient Name: Candice [name], Gasser Date: 06/04/2023 Gender: Female D.O.B: 06/15/1951 Age (years): 12 Referring Provider: Tammy Parrett Height (inches): 67 Interpreting Physician: Coralyn Helling MD, ABSM Weight (lbs): 215 RPSGT: Alfonso Ellis BMI: 34 MRN: [id number] Neck Size: 16.00  CLINICAL INFORMATION The patient is referred for a CPAP titration to treat sleep apnea.  Date of HST 03/08/23: AHI 10.3, SpO2 low 55%.  SLEEP STUDY TECHNIQUE As per the AASM Manual for the Scoring of Sleep and Associated Events v2.3 (April 2016) with a hypopnea requiring 4% desaturations.  The channels recorded and monitored were frontal, central and occipital EEG, electrooculogram (EOG), submentalis EMG (chin), nasal and oral airflow, thoracic and abdominal wall motion, anterior tibialis EMG, snore microphone, electrocardiogram, and pulse oximetry. Continuous positive airway pressure (CPAP) was initiated at the beginning of the study and titrated to treat sleep-disordered breathing.  MEDICATIONS Medications self-administered by patient taken the night of the study : N/A  TECHNICIAN COMMENTS Comments added by technician: CPAP therapy started at 4 CWP . Titration increased to 7 CWP due to events in REM stage. Patient tolerated CPAP very well. Hyperventilation noted. ECG = PAC's Comments added by scorer: N/A  RESPIRATORY PARAMETERS Optimal PAP Pressure (cm): 7 AHI at Optimal Pressure (/hr): 0 Overall Minimal O2 (%): 83.00 Supine % at Optimal Pressure (%): 100 Minimal O2 at Optimal Pressure (%): 89.0   SLEEP ARCHITECTURE The study was initiated at 10:57:32 PM and ended at 5:27:26 AM.  Sleep onset time was 24.2 minutes and the sleep efficiency was 88.4%. The total sleep time was 344.5 minutes.  The patient spent 3.48% of the night in stage N1 sleep, 65.02% in stage N2 sleep, 18.14% in stage N3 and 13.4% in REM.Stage REM latency was 278.5 minutes  Wake after sleep onset was 21.2. Alpha  intrusion was absent. Supine sleep was 55.54%.  CARDIAC DATA The 2 lead EKG demonstrated sinus rhythm. The mean heart rate was 72.79 beats per minute. Other EKG findings include: None.  LEG MOVEMENT DATA The total Periodic Limb Movements of Sleep (PLMS) were 0. The PLMS index was 0.00. A PLMS index of <15 is considered normal in adults.  IMPRESSIONS - The optimal PAP pressure was 7 cm of water. - She did not require supplemental oxygen during this study.  DIAGNOSIS - Obstructive Sleep Apnea (G47.33)  RECOMMENDATIONS - Trial of CPAP therapy on 7 cm H2O with a Small-Medium size Fisher[employer]Paykel Full Face Evora Full mask and heated humidification. - Avoid alcohol, sedatives and other CNS depressants that may worsen sleep apnea and disrupt normal sleep architecture. - Sleep hygiene should be reviewed to assess factors that may improve sleep quality. - Weight management and regular exercise should be initiated or continued.  [Electronically signed] 06/05/2023 08:49 AM  Coralyn Helling MD, ABSM Diplomate, American Board of Sleep Medicine NPI: [id number]  [facility] PH: [phone]   FX: [phone] ACCREDITED BY THE AMERICAN ACADEMY OF SLEEP MEDICINE

## 2023-06-05 NOTE — Telephone Encounter (Signed)
CPAP titration 06/04/23 >> CPAP 7 cm H2O >> AHI 0, SpO2 low 89%   Please let her know she did well with CPAP during the sleep study and she didn't need supplemental oxygen.  She should continue using auto CPAP while asleep.  Please schedule ROV in 3 months.

## 2023-06-07 DIAGNOSIS — Z6832 Body mass index (BMI) 32.0-32.9, adult: Secondary | ICD-10-CM | POA: Diagnosis not present

## 2023-06-07 DIAGNOSIS — M79604 Pain in right leg: Secondary | ICD-10-CM | POA: Diagnosis not present

## 2023-06-07 DIAGNOSIS — I10 Essential (primary) hypertension: Secondary | ICD-10-CM | POA: Diagnosis not present

## 2023-06-07 DIAGNOSIS — F411 Generalized anxiety disorder: Secondary | ICD-10-CM | POA: Diagnosis not present

## 2023-06-07 DIAGNOSIS — K2100 Gastro-esophageal reflux disease with esophagitis, without bleeding: Secondary | ICD-10-CM | POA: Diagnosis not present

## 2023-06-07 DIAGNOSIS — M25561 Pain in right knee: Secondary | ICD-10-CM | POA: Diagnosis not present

## 2023-06-07 DIAGNOSIS — Z20828 Contact with and (suspected) exposure to other viral communicable diseases: Secondary | ICD-10-CM | POA: Diagnosis not present

## 2023-06-07 DIAGNOSIS — E7849 Other hyperlipidemia: Secondary | ICD-10-CM | POA: Diagnosis not present

## 2023-06-07 DIAGNOSIS — R5383 Other fatigue: Secondary | ICD-10-CM | POA: Diagnosis not present

## 2023-06-07 DIAGNOSIS — J45909 Unspecified asthma, uncomplicated: Secondary | ICD-10-CM | POA: Diagnosis not present

## 2023-06-08 ENCOUNTER — Telehealth: Payer: Self-pay | Admitting: Adult Health

## 2023-06-08 NOTE — Telephone Encounter (Signed)
Spoke with pt and notified of results per Dr. Sood. Pt verbalized understanding and denied any questions.  

## 2023-06-08 NOTE — Telephone Encounter (Signed)
Sleep study shows optimal control on CPAP.  Please make sure that has received her CPAP.  Will need office visit with follow-up and download to evaluate her response.

## 2023-06-20 ENCOUNTER — Telehealth: Payer: Self-pay | Admitting: *Deleted

## 2023-06-20 NOTE — Telephone Encounter (Signed)
Can double book tomorrow to be seen  Please contact office for sooner follow up if symptoms do not improve or worsen or seek emergency care

## 2023-06-20 NOTE — Telephone Encounter (Signed)
Called and spoke with patient, advised of results per Rexene Edison NP.  She verbalized understanding.  Nothing further needed.

## 2023-06-20 NOTE — Telephone Encounter (Signed)
Called and spoke with patient regarding results of CPAP titrations study.  While I was on the phone with her, she stated that she is continuing to have wheezing and is having to use her nebulizer medication, Duoneb every 4 hours instead of every 6 hours.  She has a hernia and was concerned about how that affects her breathing and wanted to come in sooner than 6 months.  I advised her that Tammy does not have any openings until 9/10 and that Dr. Craige Cotta will be leaving in September.  I let her know that I would get a message to Tammy and see what she recommends.  She asked that I call her back tomorrow as she has appointments this afternoon, but will be available tomorrow.  I let her know that I would call her back once I heard back from Tammy.  Tammy, please advise regarding continues wheezing, having to use her Duoneb every 4 hours instead of every 6 hours and request to f/u prior to November, but next available is 9/10.  Thank you.

## 2023-06-22 DIAGNOSIS — E1122 Type 2 diabetes mellitus with diabetic chronic kidney disease: Secondary | ICD-10-CM | POA: Diagnosis not present

## 2023-06-22 DIAGNOSIS — E876 Hypokalemia: Secondary | ICD-10-CM | POA: Diagnosis not present

## 2023-06-22 DIAGNOSIS — G4733 Obstructive sleep apnea (adult) (pediatric): Secondary | ICD-10-CM | POA: Diagnosis not present

## 2023-06-22 DIAGNOSIS — N182 Chronic kidney disease, stage 2 (mild): Secondary | ICD-10-CM | POA: Diagnosis not present

## 2023-06-26 ENCOUNTER — Ambulatory Visit: Payer: Medicare Other | Admitting: Adult Health

## 2023-06-26 VITALS — BP 140/90 | HR 63 | Ht 66.0 in | Wt 199.0 lb

## 2023-06-26 DIAGNOSIS — K449 Diaphragmatic hernia without obstruction or gangrene: Secondary | ICD-10-CM

## 2023-06-26 DIAGNOSIS — J4531 Mild persistent asthma with (acute) exacerbation: Secondary | ICD-10-CM | POA: Diagnosis not present

## 2023-06-26 DIAGNOSIS — J42 Unspecified chronic bronchitis: Secondary | ICD-10-CM

## 2023-06-26 DIAGNOSIS — G4733 Obstructive sleep apnea (adult) (pediatric): Secondary | ICD-10-CM

## 2023-06-26 DIAGNOSIS — J45909 Unspecified asthma, uncomplicated: Secondary | ICD-10-CM | POA: Insufficient documentation

## 2023-06-26 MED ORDER — BUDESONIDE 0.25 MG/2ML IN SUSP: 0.2500 mg | Freq: Two times a day (BID) | RESPIRATORY_TRACT | 11 refills | Status: DC

## 2023-06-26 MED ORDER — PREDNISONE 20 MG PO TABS: 20.0000 mg | ORAL_TABLET | Freq: Every day | ORAL | 0 refills | Status: DC

## 2023-06-26 NOTE — Patient Instructions (Addendum)
Wear CPAP at bedtime, goal was to wear all night long for at least 6 or more hours Order to DME for mask fitting  Change CPAP pressure to 7cm.  Work on healthy weight loss Do not drive if sleep  Healthy sleep regimen   Prednisone 20mg  daily for 5 days.  Add Budesonide Neb Twice daily   Continue on Duoneb neb Four times a day .  Delsym 2 tsp Twice daily  for cough As needed    Continue on Protonix 40mg  Twice daily .  Continue on Claritin 10mg  daily  Follow up in 6-8 weeks with PFT and As needed

## 2023-06-26 NOTE — Assessment & Plan Note (Addendum)
Large hiatal hernia noted on chest x-ray.  There is compressive atelectasis on the right.  Could be causing some restrictive changes.  PFTs are pending . continue on GERD therapy.  Patient has no dysphagia or overt reflux.  Continue follow-up with primary care.

## 2023-06-26 NOTE — Assessment & Plan Note (Signed)
Intermittent episodes of cough and wheezing may have a component of asthma.  Recent COVID-19 infection patient may have postviral cough with reactive airways-recommend she undergo PFT testing.  Currently using DuoNeb 4 times daily.  Will add in budesonide twice daily.  Had no perceived benefit to inhalers.  Workup chest x-ray does show a large hiatal hernia.  Will continue with GERD management with PPI therapy.  2D echo showed preserved EF with only mild diastolic dysfunction.  She does not appear to be in volume overload. Will treat with a short course of steroids.  Control for triggers. Plan  Patient Instructions  Wear CPAP at bedtime, goal was to wear all night long for at least 6 or more hours Order to DME for mask fitting  Change CPAP pressure to 7cm.  Work on healthy weight loss Do not drive if sleep  Healthy sleep regimen   Prednisone 20mg  daily for 5 days.  Add Budesonide Neb Twice daily   Continue on Duoneb neb Four times a day .  Delsym 2 tsp Twice daily  for cough As needed    Continue on Protonix 40mg  Twice daily .  Continue on Claritin 10mg  daily  Follow up in 6-8 weeks with PFT and As needed

## 2023-06-26 NOTE — Assessment & Plan Note (Signed)
Mild obstructive sleep apnea recent titration study showed optimal control on 7 cm H2O.  Will change current settings to CPAP 7 cm H2O.  Encouraged on CPAP compliance and usage each night.  Order for mask fitting.  Plan  Patient Instructions  Wear CPAP at bedtime, goal was to wear all night long for at least 6 or more hours Order to DME for mask fitting  Change CPAP pressure to 7cm.  Work on healthy weight loss Do not drive if sleep  Healthy sleep regimen   Prednisone 20mg  daily for 5 days.  Add Budesonide Neb Twice daily   Continue on Duoneb neb Four times a day .  Delsym 2 tsp Twice daily  for cough As needed    Continue on Protonix 40mg  Twice daily .  Continue on Claritin 10mg  daily  Follow up in 6-8 weeks with PFT and As needed

## 2023-06-26 NOTE — Progress Notes (Signed)
@Patient  ID: Candice [name], female    DOB: [birth year], 72 y.o.   MRN: [id number]  Chief Complaint  Patient presents with   Follow-up    Referring provider: Dell Ponto  HPI: 72 year old female seen for sleep consult December 05, 2022 for snoring and daytime sleepiness found to have mild obstructive sleep apnea.  TEST/EVENTS :  Home sleep study that was completed on March 08, 2023 that showed mild sleep apnea with AHI at 10.3/hour and SpO2 low at 55%, SpO2 average at 92%.   CPAP titration 06/04/23 >> CPAP 7 cm H2O >> AHI 0, SpO2 low 89%    06/26/2023 Follow up: OSA , Dyspnea  Patient returns for a 35-month follow-up.  Patient was seen in January for a sleep consult.  She had snoring and daytime sleepiness.  She was set up for home sleep study that was done March 08, 2023.  This showed mild obstructive sleep apnea significant nocturnal hypoxemia.Marland Kitchen  She was recommended to start on CPAP therapy.  Patient says she has started on CPAP.  She does feel that it helps some however she has difficulty tolerating the mask.  Due to her nocturnal hypoxemia she was set up for CPAP titration study that was done on that showed optimal control on CPAP 7 cm H2O.  AHI was 0 and SpO2 low at 89%.  We reviewed her titration study results today.  Recent CPAP download shows 83% compliance daily average usage at 5.5 hours.  AHI 0.6.  Patient is on auto CPAP 5 to 20 cm H2O.  Daily average pressure at 7.6 cm H2O.  Last visit patient did complain of shortness of breath and decreased activity tolerance.  She does have occasional wheezing and shortness of breath.  Had previously tried Symbicort, Advair and Trelegy.  Had no real perceived benefit.  Patient says she is trying to be more active and is doing water exercises at the [facility].  Last visit she was started on DuoNeb which she feels is helping some.  She takes this 4 times a day.  She says she still gets some intermittent wheezing.  Chest x-ray showed large  hiatal hernia with chronic right lung compressive atelectasis.  2D echo showed preserved EF.  Normal right ventricular systolic function and size.  Grade 1 diastolic dysfunction.  Recent labs showed normal hemoglobin and hematocrit.  We reviewed her test results in detail. She complains that she did have COVID-19 infection Avva 2 to 3 weeks ago.  Says since then she has had more dry cough and intermittent wheezing.   Allergies  Allergen Reactions   Codeine Nausea Only   Pholcodine Itching   Elemental Sulfur Rash   Sulfa Antibiotics Rash    Immunization History  Administered Date(s) Administered   Influenza, High Dose Seasonal PF 07/26/2017   PFIZER(Purple Top)SARS-COV-2 Vaccination 12/03/2019, 12/24/2019, 07/29/2020   Pneumococcal Conjugate-13 04/13/2016   Pneumococcal Polysaccharide-23 07/26/2017   Zoster Recombinant(Shingrix) 01/22/2019, 07/23/2019, 07/25/2019    Past Medical History:  Diagnosis Date   Anemia    Anxiety    Arthritis    Asthma    Chronic constipation    Diverticulosis of colon    Dyspnea    sob with exertion   GERD (gastroesophageal reflux disease)    Hemorrhoids    Herpes    History of lumpectomy of right breast    Hypertension    Iron deficiency anemia due to chronic blood loss 06/29/2021   Major depression    Rectocele  W/ POSSIBLE CYSTOCELE    Tobacco History: Social History   Tobacco Use  Smoking Status Former   Passive exposure: Past  Smokeless Tobacco Never  Tobacco Comments   years ago   Counseling given: Not Answered Tobacco comments: years ago   Outpatient Medications Prior to Visit  Medication Sig Dispense Refill   acetaminophen (TYLENOL) 650 MG CR tablet Take 1,300 mg by mouth in the morning and at bedtime.     acyclovir (ZOVIRAX) 400 MG tablet Take 400 mg by mouth daily as needed (outbreak).     acyclovir (ZOVIRAX) 400 MG tablet Take 1 tablet by mouth daily.     Apoaequorin (PREVAGEN PO) Take 1 tablet by mouth daily.      Biotin w/ Vitamins C & E (HAIR/SKIN/NAILS PO) Take 1 tablet by mouth daily.     BLACK CURRANT SEED OIL PO Take 5 mLs by mouth daily. Cold Pressed Black Seed Oil     carvedilol (COREG) 12.5 MG tablet      celecoxib (CELEBREX) 200 MG capsule Take 200 mg by mouth daily.     Cholecalciferol (VITAMIN D3) 10 MCG (400 UNIT) tablet Take 400 Units by mouth daily.     citalopram (CELEXA) 20 MG tablet Take 20 mg by mouth daily. In the morning     clonazePAM (KLONOPIN) 0.5 MG tablet Take 0.25 mg by mouth at bedtime.     Coenzyme Q10 (COQ-10 PO) Take 15 mLs by mouth daily.     diphenhydrAMINE HCl (NERVINE PO) Take 1 tablet by mouth daily.     donepezil (ARICEPT) 5 MG tablet Take 5 mg by mouth at bedtime.     ezetimibe (ZETIA) 10 MG tablet      fluticasone (FLONASE) 50 MCG/ACT nasal spray Place 1 spray into both nostrils at bedtime. 16 g 2   gabapentin (NEURONTIN) 300 MG capsule Take 1 capsule by mouth at bedtime.     ipratropium-albuterol (DUONEB) 0.5-2.5 (3) MG/3ML SOLN Take 3 mLs by nebulization every 6 (six) hours as needed. 1080 mL 1   linaclotide (LINZESS) 145 MCG CAPS capsule Take 145 mcg by mouth daily as needed (for constipation).     loratadine (CLARITIN) 10 MG tablet Take 10 mg by mouth daily.     methocarbamol (ROBAXIN) 500 MG tablet Take 500 mg by mouth 4 (four) times daily.     Misc Natural Products (OSTEO BI-FLEX ADV TRIPLE ST) TABS Take 2 tablets by mouth 2 (two) times daily.     MOBIC 15 MG tablet Take 15 mg by mouth daily.     olmesartan-hydrochlorothiazide (BENICAR HCT) 40-12.5 MG tablet Take 1 tablet by mouth daily.     pantoprazole (PROTONIX) 40 MG tablet Take 1 tablet by mouth 2 (two) times daily.     PROAIR HFA 108 (90 BASE) MCG/ACT inhaler Take 2 puffs by mouth 4 (four) times daily as needed for wheezing or shortness of breath.     traMADol (ULTRAM) 50 MG tablet Take 50 mg by mouth every 6 (six) hours.     TRULICITY 0.75 MG/0.5ML SOPN Inject into the skin.     vitamin B-12  (CYANOCOBALAMIN) 500 MCG tablet Take 500 mcg by mouth daily.     Facility-Administered Medications Prior to Visit  Medication Dose Route Frequency Provider Last Rate Last Admin   0.9 %  sodium chloride infusion   Intravenous Continuous Doreatha Massed, MD 10 mL/hr at 04/15/18 1445 Continued from Pre-op at 10/15/20 0834     Review of Systems:  Constitutional:   No  weight loss, night sweats,  Fevers, chills, + fatigue, or  lassitude.  HEENT:   No headaches,  Difficulty swallowing,  Tooth/dental problems, or  Sore throat,                No sneezing, itching, ear ache, nasal congestion, post nasal drip,   CV:  No chest pain,  Orthopnea, PND, swelling in lower extremities, anasarca, dizziness, palpitations, syncope.   GI  No heartburn, indigestion, abdominal pain, nausea, vomiting, diarrhea, change in bowel habits, loss of appetite, bloody stools.   Resp: No shortness of breath with exertion or at rest.  No excess mucus, no productive cough,  No non-productive cough,  No coughing up of blood.  No change in color of mucus.  No wheezing.  No chest wall deformity  Skin: no rash or lesions.  GU: no dysuria, change in color of urine, no urgency or frequency.  No flank pain, no hematuria   MS:  No joint pain or swelling.  No decreased range of motion.  No back pain.    Physical Exam  BP (!) 140/90   Pulse 63   Ht 5\' 6"  (1.676 m)   Wt 199 lb (90.3 kg)   SpO2 96%   BMI 32.12 kg/m   GEN: A/Ox3; pleasant , NAD, well nourished    HEENT:  [place]/AT,  EACs-clear, TMs-wnl, NOSE-clear, THROAT-clear, no lesions, no postnasal drip or exudate noted.   NECK:  Supple w/ fair ROM; no JVD; normal carotid impulses w/o bruits; no thyromegaly or nodules palpated; no lymphadenopathy.    RESP  Clear  P & A; w/o, wheezes/ rales/ or rhonchi. no accessory muscle use, no dullness to percussion  CARD:  RRR, no m/r/g, no peripheral edema, pulses intact, no cyanosis or clubbing.  GI:   Soft & nt; nml  bowel sounds; no organomegaly or masses detected.   Musco: Warm bil, no deformities or joint swelling noted.   Neuro: alert, no focal deficits noted.    Skin: Warm, no lesions or rashes    Lab Results:  CBC    Component Value Date/Time   WBC 5.3 06/01/2023 1421   RBC 4.52 06/01/2023 1421   HGB 13.8 06/01/2023 1421   HCT 42.6 06/01/2023 1421   PLT 245 06/01/2023 1421   MCV 94.2 06/01/2023 1421   MCH 30.5 06/01/2023 1421   MCHC 32.4 06/01/2023 1421   RDW 14.1 06/01/2023 1421   LYMPHSABS 1.2 05/07/2023 1435   MONOABS 0.6 05/07/2023 1435   EOSABS 0.4 05/07/2023 1435   BASOSABS 0.0 05/07/2023 1435    BMET    Component Value Date/Time   NA 140 06/01/2023 1421   K 3.3 (L) 06/01/2023 1421   CL 103 06/01/2023 1421   CO2 26 06/01/2023 1421   GLUCOSE 98 06/01/2023 1421   BUN 16 06/01/2023 1421   CREATININE 0.94 06/01/2023 1421   CALCIUM 9.7 06/01/2023 1421   CALCIUM 10.1 06/01/2023 1421   GFRNONAA >60 06/01/2023 1421   GFRAA 53 (L) 05/06/2020 1317    BNP No results found for: "BNP"  ProBNP No results found for: "PROBNP"  Imaging: Sleep Study Documents  Result Date: 06/05/2023 Ordered by an unspecified provider.   Administration History     None           No data to display          No results found for: "NITRICOXIDE"      Assessment & Plan:   OSA (  obstructive sleep apnea) Mild obstructive sleep apnea recent titration study showed optimal control on 7 cm H2O.  Will change current settings to CPAP 7 cm H2O.  Encouraged on CPAP compliance and usage each night.  Order for mask fitting.  Plan  Patient Instructions  Wear CPAP at bedtime, goal was to wear all night long for at least 6 or more hours Order to DME for mask fitting  Change CPAP pressure to 7cm.  Work on healthy weight loss Do not drive if sleep  Healthy sleep regimen   Prednisone 20mg  daily for 5 days.  Add Budesonide Neb Twice daily   Continue on Duoneb neb Four times a day .   Delsym 2 tsp Twice daily  for cough As needed    Continue on Protonix 40mg  Twice daily .  Continue on Claritin 10mg  daily  Follow up in 6-8 weeks with PFT and As needed      Asthma Intermittent episodes of cough and wheezing may have a component of asthma.  Recent COVID-19 infection patient may have postviral cough with reactive airways-recommend she undergo PFT testing.  Currently using DuoNeb 4 times daily.  Will add in budesonide twice daily.  Had no perceived benefit to inhalers.  Workup chest x-ray does show a large hiatal hernia.  Will continue with GERD management with PPI therapy.  2D echo showed preserved EF with only mild diastolic dysfunction.  She does not appear to be in volume overload. Will treat with a short course of steroids.  Control for triggers. Plan  Patient Instructions  Wear CPAP at bedtime, goal was to wear all night long for at least 6 or more hours Order to DME for mask fitting  Change CPAP pressure to 7cm.  Work on healthy weight loss Do not drive if sleep  Healthy sleep regimen   Prednisone 20mg  daily for 5 days.  Add Budesonide Neb Twice daily   Continue on Duoneb neb Four times a day .  Delsym 2 tsp Twice daily  for cough As needed    Continue on Protonix 40mg  Twice daily .  Continue on Claritin 10mg  daily  Follow up in 6-8 weeks with PFT and As needed      Hiatal hernia Large hiatal hernia noted on chest x-ray.  There is compressive atelectasis on the right.  Could be causing some restrictive changes.  PFTs are pending . continue on GERD therapy.  Patient has no dysphagia or overt reflux.  Continue follow-up with primary care.     Rubye Oaks, NP 06/26/2023

## 2023-06-27 NOTE — Progress Notes (Signed)
Reviewed and agree with assessment/plan.   Coralyn Helling, MD [facility] Pulmonary/Critical Care 06/27/2023, 8:17 AM Pager:  [phone]

## 2023-07-03 ENCOUNTER — Telehealth: Payer: Self-pay | Admitting: Adult Health

## 2023-07-03 NOTE — Telephone Encounter (Signed)
Office notes from 8/13 were faxed to [facility]

## 2023-07-11 ENCOUNTER — Other Ambulatory Visit (HOSPITAL_BASED_OUTPATIENT_CLINIC_OR_DEPARTMENT_OTHER): Payer: Self-pay

## 2023-07-11 MED ORDER — IPRATROPIUM-ALBUTEROL 0.5-2.5 (3) MG/3ML IN SOLN: 3.0000 mL | Freq: Four times a day (QID) | RESPIRATORY_TRACT | 1 refills | Status: AC | PRN

## 2023-07-19 ENCOUNTER — Telehealth: Payer: Self-pay | Admitting: Adult Health

## 2023-07-19 NOTE — Telephone Encounter (Signed)
OV 06/26/23 OSA and possible asthma .  CPAP download shows 83% compliance daily average usage at 5.5 hours. AHI 0.6. Patient is on auto CPAP 5 to 20 cm H2O. Daily average pressure at 7.6 cm H2O.  From a pulmonary standpoint her sleep apnea is under good control on CPAP-she is encouraged on nightly usage.  Regarding surgery : would recommend procedure be done in Hospital setting , not OP surgical center .  CPAP in post op setting if indicated.  She is at mild to moderate surgical risk from a pulmonary standpoint with her age comorbidities and sleep apnea.  She is also undergoing a work up for breathing issues and PFTs are pending so cannot speak to her lung capacity. Last office visit she was having increased asthma like symptoms.  As long as she is improved would be considered stable with the above potential risk   Major Pulmonary risks identified in the multifactorial risk analysis are but not limited to a) pneumonia; b) recurrent intubation risk; c) prolonged or recurrent acute respiratory failure needing mechanical ventilation; d) prolonged hospitalization; e) DVT/Pulmonary embolism; f) Acute Pulmonary edema  Recommend 1. Short duration of surgery as much as possible and avoid paralytic if possible 2. Recovery in step down or ICU with Pulmonary consultation  If indicated  3. DVT prophylaxis per protocol  4. Aggressive pulmonary toilet with o2, bronchodilatation, and incentive spirometry and early ambulation 5. CPAP in post op setting if indicated.

## 2023-07-19 NOTE — Telephone Encounter (Signed)
Fax received from Dr. Malon Kindle with [facility] to perform a Left total knee replacement on patient.  Patient needs surgery clearance. Surgery is Pending. Patient was seen on 06/26/23. Office protocol is a risk assessment can be sent to surgeon if patient has been seen in 60 days or less.   Sending to [facility]  for risk assessment or recommendations if patient needs to be seen in office prior to surgical procedure.     Patient has OV scheduled on 09/10/23

## 2023-07-27 ENCOUNTER — Telehealth: Payer: Self-pay | Admitting: Adult Health

## 2023-07-27 ENCOUNTER — Telehealth: Payer: Self-pay | Admitting: Pulmonary Disease

## 2023-07-27 DIAGNOSIS — R808 Other proteinuria: Secondary | ICD-10-CM | POA: Diagnosis not present

## 2023-07-27 DIAGNOSIS — N182 Chronic kidney disease, stage 2 (mild): Secondary | ICD-10-CM | POA: Diagnosis not present

## 2023-07-27 DIAGNOSIS — Z01818 Encounter for other preprocedural examination: Secondary | ICD-10-CM | POA: Diagnosis not present

## 2023-07-27 NOTE — Telephone Encounter (Signed)
Note from 07/19/23 from Tammy providing risk assessment was faxed to Dr. Dietrich Pates office at [phone]  I called and spoke with the pt and notified her that this was done

## 2023-07-27 NOTE — Telephone Encounter (Signed)
Please see last signed encounter. PT states her Dr. Malon Kindle Is still waiting for the surgical clearance Ms. Parrett put together and has sent over several requests via fax.

## 2023-07-27 NOTE — Telephone Encounter (Signed)
Patient is calling because she states that the doctors office has not received surgical clearance for for her knee replacement surgery.

## 2023-08-28 DIAGNOSIS — M170 Bilateral primary osteoarthritis of knee: Secondary | ICD-10-CM | POA: Diagnosis not present

## 2023-08-28 DIAGNOSIS — M11861 Other specified crystal arthropathies, right knee: Secondary | ICD-10-CM | POA: Diagnosis not present

## 2023-08-28 DIAGNOSIS — M11862 Other specified crystal arthropathies, left knee: Secondary | ICD-10-CM | POA: Diagnosis not present

## 2023-09-10 ENCOUNTER — Encounter: Payer: Self-pay | Admitting: Adult Health

## 2023-09-10 ENCOUNTER — Ambulatory Visit (INDEPENDENT_AMBULATORY_CARE_PROVIDER_SITE_OTHER): Payer: Medicare Other | Admitting: Adult Health

## 2023-09-10 ENCOUNTER — Ambulatory Visit: Payer: Medicare Other | Admitting: Pulmonary Disease

## 2023-09-10 VITALS — BP 146/82 | HR 70 | Temp 97.6°F | Ht 66.0 in | Wt 193.0 lb

## 2023-09-10 DIAGNOSIS — J42 Unspecified chronic bronchitis: Secondary | ICD-10-CM

## 2023-09-10 DIAGNOSIS — J4540 Moderate persistent asthma, uncomplicated: Secondary | ICD-10-CM | POA: Diagnosis not present

## 2023-09-10 DIAGNOSIS — G4733 Obstructive sleep apnea (adult) (pediatric): Secondary | ICD-10-CM

## 2023-09-10 MED ORDER — ALBUTEROL SULFATE HFA 108 (90 BASE) MCG/ACT IN AERS: 1.0000 | INHALATION_SPRAY | Freq: Four times a day (QID) | RESPIRATORY_TRACT | 3 refills | Status: AC | PRN

## 2023-09-10 NOTE — Progress Notes (Unsigned)
@Patient  ID: Candice [name], female    DOB: 07-20-1951, 72 y.o.   MRN: [id number]  No chief complaint on file.   Referring provider: Dell Ponto  HPI: 72 year old female former smoker seen for sleep consult December 05, 2022 for snoring and daytime sleepiness found to have mild obstructive sleep apnea.  Also evaluated for cough, wheezing and dyspnea consistent with asthma  TEST/EVENTS :  Home sleep study that was completed on March 08, 2023 that showed mild sleep apnea with AHI at 10.3/hour and SpO2 low at 55%, SpO2 average at 92%.    CPAP titration 06/04/23 >> CPAP 7 cm H2O >> AHI 0, SpO2 low 89%   PFT  05/2023 2D echo showed preserved EF with only mild diastolic dysfunction  CXR 03/2023 Mild chronic right lung compressive atelectasis , Large hiatal hernia   09/10/2023 Follow up ; OSA , Asthma  Patient returns for a 56-month follow-up.    Previously tried Symbicort, Advair, Trelegy.  Last visit budesonide added to her nebulizer with DuoNeb 4 times a day. Asthma - does feel nebs really help her  PFTs today show severe airflow obstruction with FEV1 at 26%, ratio 65, FVC 30%, positive bronchodilator response with this 26% bronchodilator change.  Postbronchodilator FEV1 33%, ratio 81, FVC 31%, DLCO 77%.  Active at church   [place] to wear CPAP tubing feels up with water      Allergies  Allergen Reactions   Codeine Nausea Only   Pholcodine Itching   Elemental Sulfur Rash   Sulfa Antibiotics Rash    Immunization History  Administered Date(s) Administered   Influenza, High Dose Seasonal PF 07/26/2017   PFIZER(Purple Top)SARS-COV-2 Vaccination 12/03/2019, 12/24/2019, 07/29/2020   Pneumococcal Conjugate-13 04/13/2016   Pneumococcal Polysaccharide-23 07/26/2017   Zoster Recombinant(Shingrix) 01/22/2019, 07/23/2019, 07/25/2019    Past Medical History:  Diagnosis Date   Anemia    Anxiety    Arthritis    Asthma    Chronic constipation    Diverticulosis of  colon    Dyspnea    sob with exertion   GERD (gastroesophageal reflux disease)    Hemorrhoids    Herpes    History of lumpectomy of right breast    Hypertension    Iron deficiency anemia due to chronic blood loss 06/29/2021   Major depression    Rectocele    W/ POSSIBLE CYSTOCELE    Tobacco History: Social History   Tobacco Use  Smoking Status Former   Passive exposure: Past  Smokeless Tobacco Never  Tobacco Comments   years ago   Counseling given: Not Answered Tobacco comments: years ago   Outpatient Medications Prior to Visit  Medication Sig Dispense Refill   acetaminophen (TYLENOL) 650 MG CR tablet Take 1,300 mg by mouth in the morning and at bedtime.     acyclovir (ZOVIRAX) 400 MG tablet Take 400 mg by mouth daily as needed (outbreak).     acyclovir (ZOVIRAX) 400 MG tablet Take 1 tablet by mouth daily.     Apoaequorin (PREVAGEN PO) Take 1 tablet by mouth daily.     Biotin w/ Vitamins C & E (HAIR/SKIN/NAILS PO) Take 1 tablet by mouth daily.     BLACK CURRANT SEED OIL PO Take 5 mLs by mouth daily. Cold Pressed Black Seed Oil     budesonide (PULMICORT) 0.25 MG/2ML nebulizer solution Take 2 mLs (0.25 mg total) by nebulization 2 (two) times daily. 120 mL 11   carvedilol (COREG) 12.5 MG tablet  celecoxib (CELEBREX) 200 MG capsule Take 200 mg by mouth daily.     Cholecalciferol (VITAMIN D3) 10 MCG (400 UNIT) tablet Take 400 Units by mouth daily.     citalopram (CELEXA) 20 MG tablet Take 20 mg by mouth daily. In the morning     clonazePAM (KLONOPIN) 0.5 MG tablet Take 0.25 mg by mouth at bedtime.     Coenzyme Q10 (COQ-10 PO) Take 15 mLs by mouth daily.     diphenhydrAMINE HCl (NERVINE PO) Take 1 tablet by mouth daily.     donepezil (ARICEPT) 5 MG tablet Take 5 mg by mouth at bedtime.     ezetimibe (ZETIA) 10 MG tablet      fluticasone (FLONASE) 50 MCG/ACT nasal spray Place 1 spray into both nostrils at bedtime. 16 g 2   gabapentin (NEURONTIN) 300 MG capsule Take 1  capsule by mouth at bedtime.     ipratropium-albuterol (DUONEB) 0.5-2.5 (3) MG/3ML SOLN Take 3 mLs by nebulization every 6 (six) hours as needed. 1080 mL 1   linaclotide (LINZESS) 145 MCG CAPS capsule Take 145 mcg by mouth daily as needed (for constipation).     loratadine (CLARITIN) 10 MG tablet Take 10 mg by mouth daily.     methocarbamol (ROBAXIN) 500 MG tablet Take 500 mg by mouth 4 (four) times daily.     Misc Natural Products (OSTEO BI-FLEX ADV TRIPLE ST) TABS Take 2 tablets by mouth 2 (two) times daily.     MOBIC 15 MG tablet Take 15 mg by mouth daily.     olmesartan-hydrochlorothiazide (BENICAR HCT) 40-12.5 MG tablet Take 1 tablet by mouth daily.     pantoprazole (PROTONIX) 40 MG tablet Take 1 tablet by mouth 2 (two) times daily.     predniSONE (DELTASONE) 20 MG tablet Take 1 tablet (20 mg total) by mouth daily with breakfast. 5 tablet 0   PROAIR HFA 108 (90 BASE) MCG/ACT inhaler Take 2 puffs by mouth 4 (four) times daily as needed for wheezing or shortness of breath.     traMADol (ULTRAM) 50 MG tablet Take 50 mg by mouth every 6 (six) hours.     TRULICITY 0.75 MG/0.5ML SOPN Inject into the skin.     vitamin B-12 (CYANOCOBALAMIN) 500 MCG tablet Take 500 mcg by mouth daily.     Facility-Administered Medications Prior to Visit  Medication Dose Route Frequency Provider Last Rate Last Admin   0.9 %  sodium chloride infusion   Intravenous Continuous Doreatha Massed, MD 10 mL/hr at 04/15/18 1445 Continued from Pre-op at 10/15/20 0834     Review of Systems:   Constitutional:   No  weight loss, night sweats,  Fevers, chills, fatigue, or  lassitude.  HEENT:   No headaches,  Difficulty swallowing,  Tooth/dental problems, or  Sore throat,                No sneezing, itching, ear ache, nasal congestion, post nasal drip,   CV:  No chest pain,  Orthopnea, PND, swelling in lower extremities, anasarca, dizziness, palpitations, syncope.   GI  No heartburn, indigestion, abdominal pain,  nausea, vomiting, diarrhea, change in bowel habits, loss of appetite, bloody stools.   Resp: No shortness of breath with exertion or at rest.  No excess mucus, no productive cough,  No non-productive cough,  No coughing up of blood.  No change in color of mucus.  No wheezing.  No chest wall deformity  Skin: no rash or lesions.  GU: no dysuria, change in  color of urine, no urgency or frequency.  No flank pain, no hematuria   MS:  No joint pain or swelling.  No decreased range of motion.  No back pain.    Physical Exam  There were no vitals taken for this visit.  GEN: A/Ox3; pleasant , NAD, well nourished    HEENT:  [place]/AT,  EACs-clear, TMs-wnl, NOSE-clear, THROAT-clear, no lesions, no postnasal drip or exudate noted.   NECK:  Supple w/ fair ROM; no JVD; normal carotid impulses w/o bruits; no thyromegaly or nodules palpated; no lymphadenopathy.    RESP  Clear  P & A; w/o, wheezes/ rales/ or rhonchi. no accessory muscle use, no dullness to percussion  CARD:  RRR, no m/r/g, no peripheral edema, pulses intact, no cyanosis or clubbing.  GI:   Soft & nt; nml bowel sounds; no organomegaly or masses detected.   Musco: Warm bil, no deformities or joint swelling noted.   Neuro: alert, no focal deficits noted.    Skin: Warm, no lesions or rashes    Lab Results:  CBC    Component Value Date/Time   WBC 5.3 06/01/2023 1421   RBC 4.52 06/01/2023 1421   HGB 13.8 06/01/2023 1421   HCT 42.6 06/01/2023 1421   PLT 245 06/01/2023 1421   MCV 94.2 06/01/2023 1421   MCH 30.5 06/01/2023 1421   MCHC 32.4 06/01/2023 1421   RDW 14.1 06/01/2023 1421   LYMPHSABS 1.2 05/07/2023 1435   MONOABS 0.6 05/07/2023 1435   EOSABS 0.4 05/07/2023 1435   BASOSABS 0.0 05/07/2023 1435    BMET    Component Value Date/Time   NA 140 06/01/2023 1421   K 3.3 (L) 06/01/2023 1421   CL 103 06/01/2023 1421   CO2 26 06/01/2023 1421   GLUCOSE 98 06/01/2023 1421   BUN 16 06/01/2023 1421   CREATININE 0.94  06/01/2023 1421   CALCIUM 9.7 06/01/2023 1421   CALCIUM 10.1 06/01/2023 1421   GFRNONAA >60 06/01/2023 1421   GFRAA 53 (L) 05/06/2020 1317    BNP No results found for: "BNP"  ProBNP No results found for: "PROBNP"  Imaging: No results found.  Administration History     None           No data to display          No results found for: "NITRICOXIDE"      Assessment & Plan:   No problem-specific Assessment & Plan notes found for this encounter.     Rubye Oaks, NP 09/10/2023

## 2023-09-10 NOTE — Patient Instructions (Signed)
Full PFT performed today. °

## 2023-09-10 NOTE — Patient Instructions (Addendum)
Order to DME to evaluate the CPAP.  Decrease CPAP pressure to 5 to 12cmH2O.  Wear CPAP at bedtime, goal was to wear all night long for at least 6 or more hours Work on healthy weight loss Do not drive if sleep  Healthy sleep regimen   Continue on Duoneb (Albuterol /Ipratropium) [employer] Four times a day.  Increase Budesonide Neb Twice daily   Albuterol inhaler As needed   Activity as tolerated.  Continue on Claritin and Flonase daily   Follow up with Dr. Wynona Neat in 4 months and As needed  (30 min slot)

## 2023-09-10 NOTE — Progress Notes (Signed)
Full PFT performed today. °

## 2023-09-11 NOTE — Assessment & Plan Note (Signed)
Mild obstructive sleep apnea.  Patient is having difficulty tolerating CPAP.  Order placed to DME to evaluate patient's machine.  May need to decrease humidity.  Encouraged on CPAP compliance.  Plan Patient Instructions  Order to DME to evaluate the CPAP.  Decrease CPAP pressure to 5 to 12cmH2O.  Wear CPAP at bedtime, goal was to wear all night long for at least 6 or more hours Work on healthy weight loss Do not drive if sleep  Healthy sleep regimen   Continue on Duoneb (Albuterol /Ipratropium) [employer] Four times a day.  Increase Budesonide Neb Twice daily   Albuterol inhaler As needed   Activity as tolerated.  Continue on Claritin and Flonase daily   Follow up with Dr. Wynona Neat in 4 months and As needed  (30 min slot)

## 2023-09-11 NOTE — Assessment & Plan Note (Signed)
Moderate persistent asthma with very severe airflow obstruction on PFTs.  Patient had a positive bronchodilator response consistent with underlying asthma.  We had discussed that triple therapy such as Trelegy would be a good regimen for her however she says that she has tried all these inhalers in the past without any perceived benefit.  Feels that budesonide and DuoNeb have helped her quite a bit.  For now we will continue on current regimen.  Continue on Claritin and Flonase.  Plan  Patient Instructions  Order to DME to evaluate the CPAP.  Decrease CPAP pressure to 5 to 12cmH2O.  Wear CPAP at bedtime, goal was to wear all night long for at least 6 or more hours Work on healthy weight loss Do not drive if sleep  Healthy sleep regimen   Continue on Duoneb (Albuterol /Ipratropium) [employer] Four times a day.  Increase Budesonide Neb Twice daily   Albuterol inhaler As needed   Activity as tolerated.  Continue on Claritin and Flonase daily   Follow up with Dr. Wynona Neat in 4 months and As needed  (30 min slot)

## 2023-09-17 ENCOUNTER — Inpatient Hospital Stay: Payer: Medicare Other

## 2023-09-17 ENCOUNTER — Inpatient Hospital Stay: Payer: Medicare Other | Attending: Physician Assistant

## 2023-09-17 DIAGNOSIS — D509 Iron deficiency anemia, unspecified: Secondary | ICD-10-CM | POA: Diagnosis not present

## 2023-09-17 DIAGNOSIS — Z87891 Personal history of nicotine dependence: Secondary | ICD-10-CM | POA: Diagnosis not present

## 2023-09-17 DIAGNOSIS — N183 Chronic kidney disease, stage 3 unspecified: Secondary | ICD-10-CM | POA: Insufficient documentation

## 2023-09-17 DIAGNOSIS — J449 Chronic obstructive pulmonary disease, unspecified: Secondary | ICD-10-CM | POA: Insufficient documentation

## 2023-09-17 DIAGNOSIS — D631 Anemia in chronic kidney disease: Secondary | ICD-10-CM | POA: Insufficient documentation

## 2023-09-17 DIAGNOSIS — D500 Iron deficiency anemia secondary to blood loss (chronic): Secondary | ICD-10-CM

## 2023-09-17 DIAGNOSIS — N1831 Chronic kidney disease, stage 3a: Secondary | ICD-10-CM

## 2023-09-17 LAB — IRON AND TIBC
Iron: 117 ug/dL (ref 28–170)
Saturation Ratios: 29 % (ref 10.4–31.8)
TIBC: 404 ug/dL (ref 250–450)
UIBC: 287 ug/dL

## 2023-09-17 LAB — CBC WITH DIFFERENTIAL/PLATELET
Abs Immature Granulocytes: 0.02 10*3/uL (ref 0.00–0.07)
Basophils Absolute: 0 10*3/uL (ref 0.0–0.1)
Basophils Relative: 1 %
Eosinophils Absolute: 0.4 10*3/uL (ref 0.0–0.5)
Eosinophils Relative: 7 %
HCT: 42.2 % (ref 36.0–46.0)
Hemoglobin: 13.6 g/dL (ref 12.0–15.0)
Immature Granulocytes: 0 %
Lymphocytes Relative: 19 %
Lymphs Abs: 1.1 10*3/uL (ref 0.7–4.0)
MCH: 30.6 pg (ref 26.0–34.0)
MCHC: 32.2 g/dL (ref 30.0–36.0)
MCV: 95 fL (ref 80.0–100.0)
Monocytes Absolute: 0.6 10*3/uL (ref 0.1–1.0)
Monocytes Relative: 10 %
Neutro Abs: 3.7 10*3/uL (ref 1.7–7.7)
Neutrophils Relative %: 63 %
Platelets: 280 10*3/uL (ref 150–400)
RBC: 4.44 MIL/uL (ref 3.87–5.11)
RDW: 15.2 % (ref 11.5–15.5)
WBC: 5.8 10*3/uL (ref 4.0–10.5)
nRBC: 0 % (ref 0.0–0.2)

## 2023-09-17 LAB — BASIC METABOLIC PANEL
Anion gap: 10 (ref 5–15)
BUN: 27 mg/dL — ABNORMAL HIGH (ref 8–23)
CO2: 29 mmol/L (ref 22–32)
Calcium: 10.1 mg/dL (ref 8.9–10.3)
Chloride: 99 mmol/L (ref 98–111)
Creatinine, Ser: 1.3 mg/dL — ABNORMAL HIGH (ref 0.44–1.00)
GFR, Estimated: 43 mL/min — ABNORMAL LOW (ref >60)
Glucose, Bld: 87 mg/dL (ref 70–99)
Potassium: 4.1 mmol/L (ref 3.5–5.1)
Sodium: 138 mmol/L (ref 135–145)

## 2023-09-17 LAB — FERRITIN: Ferritin: 99 ng/mL (ref 11–307)

## 2023-09-18 ENCOUNTER — Telehealth: Payer: Self-pay | Admitting: Pulmonary Disease

## 2023-09-18 ENCOUNTER — Telehealth: Payer: Self-pay | Admitting: Adult Health

## 2023-09-18 DIAGNOSIS — J4540 Moderate persistent asthma, uncomplicated: Secondary | ICD-10-CM

## 2023-09-18 DIAGNOSIS — J4531 Mild persistent asthma with (acute) exacerbation: Secondary | ICD-10-CM

## 2023-09-18 NOTE — Telephone Encounter (Signed)
Please call PT. She needs to know information on some test results and she also told Ms. Parrett her nebulizer machine was leaking and she needed the hose/apparatus or a whole new machine. Her # is [phone]. She will be avail in afternoon today. She asks that we put in the neb order immediately. Please reach out on [facility] about this and PFT results.

## 2023-09-18 NOTE — Telephone Encounter (Signed)
Disregard. Misunderstood

## 2023-09-18 NOTE — Telephone Encounter (Signed)
Order for [facility]SE by Chantel states: Patient's CPAP machine needs to be serviced, it is new and will not run. Water has been getting into tubing. IMPORTANT: MD OFFICES DO NOT SERVICE SLEEP APNEA MACHINES.   She has not heard from them. States last time they showed uo at her door the next day. Please call PT to advise

## 2023-09-19 LAB — PULMONARY FUNCTION TEST
DL/VA % pred: 165 %
DL/VA: 6.69 ml/min/mmHg/L
DLCO cor % pred: 78 %
DLCO cor: 16.02 ml/min/mmHg
DLCO unc % pred: 77 %
DLCO unc: 15.87 ml/min/mmHg
FEF 25-75 Post: 0.72 L/s
FEF 25-75 Pre: 0.36 L/s
FEF2575-%Change-Post: 100 %
FEF2575-%Pred-Post: 39 %
FEF2575-%Pred-Pre: 19 %
FEV1-%Change-Post: 26 %
FEV1-%Pred-Post: 33 %
FEV1-%Pred-Pre: 26 %
FEV1-Post: 0.79 L
FEV1-Pre: 0.62 L
FEV1FVC-%Change-Post: 24 %
FEV1FVC-%Pred-Pre: 86 %
FEV6-%Change-Post: 1 %
FEV6-%Pred-Post: 32 %
FEV6-%Pred-Pre: 32 %
FEV6-Post: 0.97 L
FEV6-Pre: 0.95 L
FEV6FVC-%Pred-Post: 104 %
FEV6FVC-%Pred-Pre: 104 %
FVC-%Change-Post: 1 %
FVC-%Pred-Post: 31 %
FVC-%Pred-Pre: 30 %
FVC-Post: 0.97 L
FVC-Pre: 0.95 L
Post FEV1/FVC ratio: 81 %
Post FEV6/FVC ratio: 100 %
Pre FEV1/FVC ratio: 65 %
Pre FEV6/FVC Ratio: 100 %
RV % pred: 133 %
RV: 3.16 L
TLC % pred: 81 %
TLC: 4.38 L
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
## 2023-09-24 ENCOUNTER — Inpatient Hospital Stay (HOSPITAL_BASED_OUTPATIENT_CLINIC_OR_DEPARTMENT_OTHER): Payer: Medicare Other | Admitting: Physician Assistant

## 2023-09-24 VITALS — Wt 191.0 lb

## 2023-09-24 DIAGNOSIS — N1831 Chronic kidney disease, stage 3a: Secondary | ICD-10-CM

## 2023-09-24 DIAGNOSIS — D509 Iron deficiency anemia, unspecified: Secondary | ICD-10-CM | POA: Diagnosis not present

## 2023-09-24 DIAGNOSIS — D631 Anemia in chronic kidney disease: Secondary | ICD-10-CM | POA: Diagnosis not present

## 2023-09-24 DIAGNOSIS — J449 Chronic obstructive pulmonary disease, unspecified: Secondary | ICD-10-CM | POA: Diagnosis not present

## 2023-09-24 DIAGNOSIS — D500 Iron deficiency anemia secondary to blood loss (chronic): Secondary | ICD-10-CM | POA: Diagnosis not present

## 2023-09-24 DIAGNOSIS — N183 Chronic kidney disease, stage 3 unspecified: Secondary | ICD-10-CM | POA: Diagnosis not present

## 2023-09-24 DIAGNOSIS — Z87891 Personal history of nicotine dependence: Secondary | ICD-10-CM | POA: Diagnosis not present

## 2023-09-26 NOTE — Telephone Encounter (Addendum)
Called patient.  Patient states it was her CPAP that was leaking but she received supplies from her DME company, [employer],  and everything is working good now.  Also, she needed new supplies for her nebulizer and those items were received yesterday.  Patient states she has everything she needs except she would like the adult mask that she likes to use in the evening for her neb treatments while she is sitting in her bed.  Will send a DME order in for just the nebulizer masks.  Patient verbalized understanding and stated nothing further needed at this time.

## 2023-10-30 NOTE — Telephone Encounter (Signed)
nfn

## 2023-11-15 DIAGNOSIS — U071 COVID-19: Secondary | ICD-10-CM | POA: Diagnosis not present

## 2023-12-19 ENCOUNTER — Encounter: Payer: Self-pay | Admitting: Pulmonary Disease

## 2023-12-19 ENCOUNTER — Ambulatory Visit: Payer: Medicare Other | Admitting: Pulmonary Disease

## 2023-12-19 VITALS — BP 130/70 | HR 60 | Temp 98.0°F | Ht 67.0 in | Wt 195.0 lb

## 2023-12-19 DIAGNOSIS — G4733 Obstructive sleep apnea (adult) (pediatric): Secondary | ICD-10-CM

## 2023-12-19 DIAGNOSIS — J4540 Moderate persistent asthma, uncomplicated: Secondary | ICD-10-CM

## 2023-12-19 MED ORDER — TRELEGY ELLIPTA 200-62.5-25 MCG/ACT IN AEPB: 1.0000 | INHALATION_SPRAY | Freq: Every day | RESPIRATORY_TRACT | 5 refills | Status: DC

## 2023-12-19 NOTE — Patient Instructions (Signed)
 We will start you on Trelegy -Prescription sent to the pharmacy for you -Use it once a day -Make sure you rinse your mouth after use  We will give you a spacer to use with your inhaler  Watch a couple of videos to teach yourself how to use your inhaler properly  We will see you in about 6 weeks  Graded activities as tolerated  Continue using your CPAP nightly -Download from your machine shows it is working well
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
## 2024-01-14 DIAGNOSIS — E876 Hypokalemia: Secondary | ICD-10-CM | POA: Diagnosis not present

## 2024-01-14 DIAGNOSIS — E119 Type 2 diabetes mellitus without complications: Secondary | ICD-10-CM | POA: Diagnosis not present

## 2024-01-14 DIAGNOSIS — G4733 Obstructive sleep apnea (adult) (pediatric): Secondary | ICD-10-CM | POA: Diagnosis not present

## 2024-01-14 DIAGNOSIS — I10 Essential (primary) hypertension: Secondary | ICD-10-CM | POA: Diagnosis not present

## 2024-01-14 DIAGNOSIS — E1122 Type 2 diabetes mellitus with diabetic chronic kidney disease: Secondary | ICD-10-CM | POA: Diagnosis not present

## 2024-01-14 DIAGNOSIS — E559 Vitamin D deficiency, unspecified: Secondary | ICD-10-CM | POA: Diagnosis not present

## 2024-01-14 DIAGNOSIS — N189 Chronic kidney disease, unspecified: Secondary | ICD-10-CM | POA: Diagnosis not present

## 2024-01-14 DIAGNOSIS — I5032 Chronic diastolic (congestive) heart failure: Secondary | ICD-10-CM | POA: Diagnosis not present

## 2024-01-17 DIAGNOSIS — E1122 Type 2 diabetes mellitus with diabetic chronic kidney disease: Secondary | ICD-10-CM | POA: Diagnosis not present

## 2024-01-17 DIAGNOSIS — G4733 Obstructive sleep apnea (adult) (pediatric): Secondary | ICD-10-CM | POA: Diagnosis not present

## 2024-01-17 DIAGNOSIS — E876 Hypokalemia: Secondary | ICD-10-CM | POA: Diagnosis not present

## 2024-01-17 DIAGNOSIS — I5032 Chronic diastolic (congestive) heart failure: Secondary | ICD-10-CM | POA: Diagnosis not present

## 2024-01-22 ENCOUNTER — Inpatient Hospital Stay: Payer: Medicare Other | Attending: Physician Assistant

## 2024-01-22 DIAGNOSIS — D631 Anemia in chronic kidney disease: Secondary | ICD-10-CM | POA: Diagnosis not present

## 2024-01-22 DIAGNOSIS — D509 Iron deficiency anemia, unspecified: Secondary | ICD-10-CM | POA: Diagnosis not present

## 2024-01-22 DIAGNOSIS — N183 Chronic kidney disease, stage 3 unspecified: Secondary | ICD-10-CM | POA: Insufficient documentation

## 2024-01-22 DIAGNOSIS — D500 Iron deficiency anemia secondary to blood loss (chronic): Secondary | ICD-10-CM

## 2024-01-22 LAB — BASIC METABOLIC PANEL
Anion gap: 8 (ref 5–15)
BUN: 24 mg/dL — ABNORMAL HIGH (ref 8–23)
CO2: 28 mmol/L (ref 22–32)
Calcium: 10.2 mg/dL (ref 8.9–10.3)
Chloride: 101 mmol/L (ref 98–111)
Creatinine, Ser: 1.27 mg/dL — ABNORMAL HIGH (ref 0.44–1.00)
GFR, Estimated: 44 mL/min — ABNORMAL LOW (ref >60)
Glucose, Bld: 95 mg/dL (ref 70–99)
Potassium: 4.6 mmol/L (ref 3.5–5.1)
Sodium: 137 mmol/L (ref 135–145)

## 2024-01-22 LAB — CBC WITH DIFFERENTIAL/PLATELET
Abs Immature Granulocytes: 0.02 10*3/uL (ref 0.00–0.07)
Basophils Absolute: 0 10*3/uL (ref 0.0–0.1)
Basophils Relative: 0 %
Eosinophils Absolute: 0.2 10*3/uL (ref 0.0–0.5)
Eosinophils Relative: 2 %
HCT: 39.6 % (ref 36.0–46.0)
Hemoglobin: 12.7 g/dL (ref 12.0–15.0)
Immature Granulocytes: 0 %
Lymphocytes Relative: 19 %
Lymphs Abs: 1.3 10*3/uL (ref 0.7–4.0)
MCH: 31.4 pg (ref 26.0–34.0)
MCHC: 32.1 g/dL (ref 30.0–36.0)
MCV: 97.8 fL (ref 80.0–100.0)
Monocytes Absolute: 0.7 10*3/uL (ref 0.1–1.0)
Monocytes Relative: 10 %
Neutro Abs: 4.8 10*3/uL (ref 1.7–7.7)
Neutrophils Relative %: 69 %
Platelets: 255 10*3/uL (ref 150–400)
RBC: 4.05 MIL/uL (ref 3.87–5.11)
RDW: 14.8 % (ref 11.5–15.5)
WBC: 7 10*3/uL (ref 4.0–10.5)
nRBC: 0 % (ref 0.0–0.2)

## 2024-01-22 LAB — IRON AND TIBC
Iron: 78 ug/dL (ref 28–170)
Saturation Ratios: 20 % (ref 10.4–31.8)
TIBC: 397 ug/dL (ref 250–450)
UIBC: 319 ug/dL

## 2024-01-22 LAB — FERRITIN: Ferritin: 100 ng/mL (ref 11–307)

## 2024-01-24 DIAGNOSIS — E7849 Other hyperlipidemia: Secondary | ICD-10-CM | POA: Diagnosis not present

## 2024-01-24 DIAGNOSIS — Z6832 Body mass index (BMI) 32.0-32.9, adult: Secondary | ICD-10-CM | POA: Diagnosis not present

## 2024-01-24 DIAGNOSIS — J45909 Unspecified asthma, uncomplicated: Secondary | ICD-10-CM | POA: Diagnosis not present

## 2024-01-24 DIAGNOSIS — E782 Mixed hyperlipidemia: Secondary | ICD-10-CM | POA: Diagnosis not present

## 2024-01-24 DIAGNOSIS — K2100 Gastro-esophageal reflux disease with esophagitis, without bleeding: Secondary | ICD-10-CM | POA: Diagnosis not present

## 2024-01-24 DIAGNOSIS — R5383 Other fatigue: Secondary | ICD-10-CM | POA: Diagnosis not present

## 2024-01-28 NOTE — Progress Notes (Unsigned)
 VIRTUAL VISIT via TELEPHONE NOTE [facility]   I connected with Candice [name]  on 01/29/24 at  10:30 AM by telephone and verified that I am speaking with the correct person using two identifiers.  Location: Patient: Home Provider: [facility]   I discussed the limitations, risks, security and privacy concerns of performing an evaluation and management service by telephone and the availability of in person appointments. I also discussed with the patient that there may be a patient responsible charge related to this service. The patient expressed understanding and agreed to proceed.  REASON FOR VISIT:  Follow-up for iron deficiency anemia and anemia of CKD   PRIOR THERAPY: None   CURRENT THERAPY: Intermittent IV iron infusions (Last Feraheme x 2 in March 2024)  INTERVAL HISTORY:  [name] is contacted today for follow-up of anemia.  She was last seen by Rojelio Brenner PA-C on 09/24/2023.  At today's visit, she reports feeling fairly well.   Overall, she has been feeling some improved energy after starting CPAP therapy (August 2024) and starting to exercise regularly.  She denies any obvious hematochezia, melena, or epistaxis.  She denies any pica, chest pain, palpitations, or syncope.  She reports 75% energy and 75% appetite.  She states that she is maintaining a stable weight.  ASSESSMENT & PLAN:  1.   Anemia due to iron deficiency and kidney disease:  - Etiology is possible chronic bleeding from the small bowel, relative iron deficiency and CKD. - CBC on 05/20/2021 showed Hgb 7.2 - transfused PRBC x1 - She has had previous heme-positive stool and reports intermittent rectal bleeding - EGD (10/14/2021): Large hiatal hernia with Sheria Lang ulcers, nonbleeding angiodysplastic lesion in stomach treated with APC, nonbleeding angiodysplastic lesions in duodenum x5 treated with APC - Colonoscopy (10/14/2021): Polyps, diverticulosis, nonbleeding  internal hemorrhoids - SPEP/IFE in May 2021 was negative for monoclonal gammopathy - CKD stage III with baseline creatinine 1.3-1.6 (follows with Dr. Wolfgang Phoenix) - She failed to improve on oral iron supplementation and experienced severe constipation - She denies any recent rectal bleeding or melena.   - Last Feraheme x2 in March 2024  - Most recent labs (01/22/2024): Hgb 12.7/MCV 97.8, ferritin 100, iron saturation 20%.  Creatinine 1.27/GFR 44.  (Labs from June 2024 showed normal B12/MMA).  - PLAN: No indication for IV iron at this time.   - Labs (CBC/D, BMP, ferritin, iron/TIBC) and RTC in 6 months with OFFICE visit - Continue follow-up with GI   PLAN SUMMARY:  >> Labs in 6 months = CBC/D, BMP, ferritin, iron/TIBC  >> OFFICE visit in 6 months (1 week after labs)  **Last office visit on 09/24/2023     REVIEW OF SYSTEMS:   Review of Systems  Constitutional:  Negative for chills, diaphoresis, fever, malaise/fatigue and weight loss.  Respiratory:  Positive for cough. Negative for shortness of breath.   Cardiovascular:  Negative for chest pain and palpitations.  Gastrointestinal:  Negative for abdominal pain, blood in stool, melena, nausea and vomiting.  Musculoskeletal:  Positive for joint pain.  Neurological:  Negative for dizziness and headaches.     PHYSICAL EXAM: (per limitations of virtual telephone visit)  The patient is alert and oriented x 3, exhibiting adequate mentation, good mood, and ability to speak in full sentences and execute sound judgement.  WRAP UP:   I discussed the assessment and treatment plan with the patient. The patient was provided an opportunity to ask questions and all were  answered. The patient agreed with the plan and demonstrated an understanding of the instructions.   The patient was advised to call back or seek an in-person evaluation if the symptoms worsen or if the condition fails to improve as anticipated.  I provided 22 minutes of non-face-to-face  time during this encounter, including > 10 minutes of medical discussion.  Carnella Guadalajara, PA-C 01/29/24 11:28 AM

## 2024-01-29 ENCOUNTER — Inpatient Hospital Stay: Payer: Medicare Other | Admitting: Physician Assistant

## 2024-01-29 DIAGNOSIS — N1831 Chronic kidney disease, stage 3a: Secondary | ICD-10-CM | POA: Diagnosis not present

## 2024-01-29 DIAGNOSIS — D631 Anemia in chronic kidney disease: Secondary | ICD-10-CM | POA: Diagnosis not present

## 2024-01-29 DIAGNOSIS — D500 Iron deficiency anemia secondary to blood loss (chronic): Secondary | ICD-10-CM | POA: Diagnosis not present

## 2024-01-31 DIAGNOSIS — E1165 Type 2 diabetes mellitus with hyperglycemia: Secondary | ICD-10-CM | POA: Diagnosis not present

## 2024-01-31 DIAGNOSIS — N189 Chronic kidney disease, unspecified: Secondary | ICD-10-CM | POA: Diagnosis not present

## 2024-01-31 DIAGNOSIS — E782 Mixed hyperlipidemia: Secondary | ICD-10-CM | POA: Diagnosis not present

## 2024-01-31 DIAGNOSIS — R5383 Other fatigue: Secondary | ICD-10-CM | POA: Diagnosis not present

## 2024-02-06 ENCOUNTER — Encounter: Payer: Self-pay | Admitting: Pulmonary Disease

## 2024-02-06 ENCOUNTER — Ambulatory Visit: Payer: Medicare Other | Admitting: Pulmonary Disease

## 2024-02-06 VITALS — BP 125/69 | HR 72 | Temp 97.8°F | Ht 67.0 in | Wt 189.0 lb

## 2024-02-06 DIAGNOSIS — J4531 Mild persistent asthma with (acute) exacerbation: Secondary | ICD-10-CM

## 2024-02-06 DIAGNOSIS — G4733 Obstructive sleep apnea (adult) (pediatric): Secondary | ICD-10-CM | POA: Diagnosis not present

## 2024-02-06 MED ORDER — DOXYCYCLINE HYCLATE 100 MG PO TABS: 100.0000 mg | ORAL_TABLET | Freq: Two times a day (BID) | ORAL | 0 refills | Status: DC
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
## 2024-02-06 NOTE — Addendum Note (Signed)
 Addended by: Philippa Chester on: 02/06/2024 03:27 PM   Modules accepted: Orders

## 2024-02-06 NOTE — Patient Instructions (Addendum)
 DME referral to change CPAP pressures  Change from 5-20 to 5-8  Prescription for doxycycline-antibiotic sent to pharmacy for you for 10 days  Take 5 mg of prednisone in the morning until you start feeling better  Follow-up in 3 months  Call us with significant concerns  Continue graded activities  Use your nebulizer as you are  Use your inhalers as you are  Try and use your CPAP nightly

## 2024-02-06 NOTE — Telephone Encounter (Signed)
 Error

## 2024-02-14 DIAGNOSIS — Z95828 Presence of other vascular implants and grafts: Secondary | ICD-10-CM | POA: Diagnosis not present

## 2024-02-14 DIAGNOSIS — I671 Cerebral aneurysm, nonruptured: Secondary | ICD-10-CM | POA: Diagnosis not present

## 2024-03-03 DIAGNOSIS — I671 Cerebral aneurysm, nonruptured: Secondary | ICD-10-CM | POA: Diagnosis not present

## 2024-03-31 DIAGNOSIS — N189 Chronic kidney disease, unspecified: Secondary | ICD-10-CM | POA: Diagnosis not present

## 2024-03-31 DIAGNOSIS — D631 Anemia in chronic kidney disease: Secondary | ICD-10-CM | POA: Diagnosis not present

## 2024-03-31 DIAGNOSIS — E211 Secondary hyperparathyroidism, not elsewhere classified: Secondary | ICD-10-CM | POA: Diagnosis not present

## 2024-03-31 DIAGNOSIS — R809 Proteinuria, unspecified: Secondary | ICD-10-CM | POA: Diagnosis not present

## 2024-04-07 DIAGNOSIS — I5032 Chronic diastolic (congestive) heart failure: Secondary | ICD-10-CM | POA: Diagnosis not present

## 2024-04-07 DIAGNOSIS — R808 Other proteinuria: Secondary | ICD-10-CM | POA: Diagnosis not present

## 2024-04-07 DIAGNOSIS — E1122 Type 2 diabetes mellitus with diabetic chronic kidney disease: Secondary | ICD-10-CM | POA: Diagnosis not present

## 2024-04-07 DIAGNOSIS — N1832 Chronic kidney disease, stage 3b: Secondary | ICD-10-CM | POA: Diagnosis not present

## 2024-04-21 DIAGNOSIS — M1711 Unilateral primary osteoarthritis, right knee: Secondary | ICD-10-CM | POA: Diagnosis not present

## 2024-04-21 DIAGNOSIS — E1165 Type 2 diabetes mellitus with hyperglycemia: Secondary | ICD-10-CM | POA: Diagnosis not present

## 2024-04-21 DIAGNOSIS — J45909 Unspecified asthma, uncomplicated: Secondary | ICD-10-CM | POA: Diagnosis not present

## 2024-04-21 DIAGNOSIS — Z6831 Body mass index (BMI) 31.0-31.9, adult: Secondary | ICD-10-CM | POA: Diagnosis not present

## 2024-04-21 DIAGNOSIS — I10 Essential (primary) hypertension: Secondary | ICD-10-CM | POA: Diagnosis not present

## 2024-04-23 ENCOUNTER — Telehealth: Payer: Self-pay | Admitting: Pulmonary Disease

## 2024-04-23 NOTE — Telephone Encounter (Signed)
 Received surgical clearance form from [facility]. Left total knee arthroplasty is scheduled for September.  Pt last seen 02/06/2024. Office protocol is that patient must be seen within 60 days. Appt is needed.   Dr. Deanna Expose, please advise if okay to use held slot 06/04/2024?

## 2024-04-28 NOTE — Telephone Encounter (Signed)
 PT calling regarding surgical clearance again. Sched w/Dr. Viva Grise in [facility] for tomorrow. PT states she is in great pain and needs the surgery ASAP. Please fax Dr.'s(Dr. Kenneth Peace @ [facility]) her surgical clearance form to [facility] today. TY.

## 2024-04-28 NOTE — Telephone Encounter (Signed)
  Cleared for surgery from a pulmonary perspective  Patient being followed for obstructive sleep apnea Compliant with CPAP therapy  As long as not having significant respiratory complaints prior to surgery  May benefit from perioperative pulmonary toileting May require positive pressure ventilation perioperatively  Encourage patient to take CPAP with her to the hospital if being admitted to the hospital so she can use her machine she is used to using

## 2024-04-28 NOTE — Telephone Encounter (Signed)
 Faxed this note to [phone] Confirmation received

## 2024-04-29 ENCOUNTER — Ambulatory Visit: Admitting: Pulmonary Disease

## 2024-06-20 ENCOUNTER — Other Ambulatory Visit: Payer: Self-pay | Admitting: Pulmonary Disease

## 2024-06-26 ENCOUNTER — Other Ambulatory Visit: Payer: Self-pay

## 2024-07-01 DIAGNOSIS — Z1231 Encounter for screening mammogram for malignant neoplasm of breast: Secondary | ICD-10-CM | POA: Diagnosis not present
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
## 2024-07-07 NOTE — Patient Instructions (Signed)
 SURGICAL WAITING ROOM VISITATION  Patients having surgery or a procedure may have no more than 2 support people in the waiting area - these visitors may rotate.    Children under the age of 80 must have an adult with them who is not the patient.  Visitors with respiratory illnesses are discouraged from visiting and should remain at home.  If the patient needs to stay at the hospital during part of their recovery, the visitor guidelines for inpatient rooms apply. Pre-op nurse will coordinate an appropriate time for 1 support person to accompany patient in pre-op.  This support person may not rotate.    Please refer to the [facility] website for the visitor guidelines for Inpatients (after your surgery is over and you are in a regular room).       Your procedure is scheduled on:  Friday 07/18/2024    Report to [facility] Main Entrance    Report to admitting at  0630 AM   Call this number if you have problems the morning of surgery [phone]   Do not eat food :After Midnight.   After Midnight you may have the following liquids until __ 0600____ AM  DAY OF SURGERY  Water  Non-Citrus Juices (without pulp, NO RED-Apple, White grape, White cranberry) Black Coffee (NO MILK/CREAM OR CREAMERS, sugar ok)  Clear Tea (NO MILK/CREAM OR CREAMERS, sugar ok) regular and decaf                             Plain Jell-O (NO RED)                                           Fruit ices (not with fruit pulp, NO RED)                                     Popsicles (NO RED)                                                               Sports drinks like Gatorade (NO RED)                   The day of surgery:  Drink ONE (1) Pre-Surgery G2 at  0600AM the morning of surgery. Drink in one sitting. Do not sip.  This drink was given to you during your hospital  pre-op appointment visit. Nothing else to drink after completing the  Pre-Surgery G2.     Oral Hygiene is also important to reduce your  risk of infection.                                    Remember - BRUSH YOUR TEETH THE MORNING OF SURGERY WITH YOUR REGULAR TOOTHPASTE  DENTURES WILL BE REMOVED PRIOR TO SURGERY PLEASE DO NOT APPLY Poly grip OR ADHESIVES!!!   Do NOT smoke after Midnight   Stop all vitamins and herbal supplements 7 days before surgery.   Take these medicines the  morning of surgery with A SIP OF WATER :  Acyclovir             Carvedilol            Citalopram             Ezetimibe            Famotidine             Loratadine             Pantoprazole             Use Inhalers and Nebulizers per normal routine            Flonase  if needed        Dulaglutide DO NOT TAKE AFTER 07/10/2024  DO NOT TAKE ANY ORAL DIABETIC MEDICATIONS DAY OF YOUR SURGERY  Bring CPAP mask and tubing day of surgery.                              You may not have any metal on your body including hair pins, jewelry, and body piercing             Do not wear make-up, lotions, powders, perfumes/cologne, or deodorant  Do not wear nail polish including gel and S&S, artificial/acrylic nails, or any other type of covering on natural nails including finger and toenails. If you have artificial nails, gel coating, etc. that needs to be removed by a nail salon please have this removed prior to surgery or surgery may need to be canceled/ delayed if the surgeon/ anesthesia feels like they are unable to be safely monitored.   Do not shave  48 hours prior to surgery.    Do not bring valuables to the hospital. [facility] IS NOT             RESPONSIBLE   FOR VALUABLES.   Contacts, glasses, dentures or bridgework may not be worn into surgery.   Bring small overnight bag day of surgery.   DO NOT BRING YOUR HOME MEDICATIONS TO THE HOSPITAL. PHARMACY WILL DISPENSE MEDICATIONS LISTED ON YOUR MEDICATION LIST TO YOU DURING YOUR ADMISSION IN THE HOSPITAL!    Patients discharged on the day of surgery will not be allowed to drive home.  Someone NEEDS to  stay with you for the first 24 hours after anesthesia.   Special Instructions: Bring a copy of your healthcare power of attorney and living will documents the day of surgery if you haven't scanned them before.              Please read over the following fact sheets you were given: IF YOU HAVE QUESTIONS ABOUT YOUR PRE-OP INSTRUCTIONS PLEASE CALL [phone].   If you received a COVID test during your pre-op visit  it is requested that you wear a mask when out in public, stay away from anyone that may not be feeling well and notify your surgeon if you develop symptoms. If you test positive for Covid or have been in contact with anyone that has tested positive in the last 10 days please notify you surgeon.      Pre-operative 5 CHG Bath Instructions   You can play a key role in reducing the risk of infection after surgery. Your skin needs to be as free of germs as possible. You can reduce the number of germs on your skin by washing with CHG (chlorhexidine  gluconate) soap before surgery. CHG is an antiseptic soap that  kills germs and continues to kill germs even after washing.   DO NOT use if you have an allergy to chlorhexidine /CHG or antibacterial soaps. If your skin becomes reddened or irritated, stop using the CHG and notify one of our RNs at [phone].   Please shower with the CHG soap starting 4 days before surgery using the following schedule:     Please keep in mind the following:  DO NOT shave, including legs and underarms, starting the day of your first shower.   You may shave your face at any point before/day of surgery.  Place clean sheets on your bed the day you start using CHG soap. Use a clean washcloth (not used since being washed) for each shower. DO NOT sleep with pets once you start using the CHG.   CHG Shower Instructions:  If you choose to wash your hair and private area, wash first with your normal shampoo/soap.  After you use shampoo/soap, rinse your hair and body  thoroughly to remove shampoo/soap residue.  Turn the water  OFF and apply about 3 tablespoons (45 ml) of CHG soap to a CLEAN washcloth.  Apply CHG soap ONLY FROM YOUR NECK DOWN TO YOUR TOES (washing for 3-5 minutes)  DO NOT use CHG soap on face, private areas, open wounds, or sores.  Pay special attention to the area where your surgery is being performed.  If you are having back surgery, having someone wash your back for you may be helpful. Wait 2 minutes after CHG soap is applied, then you may rinse off the CHG soap.  Pat dry with a clean towel  Put on clean clothes/pajamas   If you choose to wear lotion, please use ONLY the CHG-compatible lotions on the back of this paper.     Additional instructions for the day of surgery: DO NOT APPLY any lotions, deodorants, cologne, or perfumes.   Put on clean/comfortable clothes.  Brush your teeth.  Ask your nurse before applying any prescription medications to the skin.      CHG Compatible Lotions   Aveeno Moisturizing lotion  Cetaphil Moisturizing Cream  Cetaphil Moisturizing Lotion  Clairol Herbal Essence Moisturizing Lotion, Dry Skin  Clairol Herbal Essence Moisturizing Lotion, Extra Dry Skin  Clairol Herbal Essence Moisturizing Lotion, Normal Skin  Curel Age Defying Therapeutic Moisturizing Lotion with Alpha Hydroxy  Curel Extreme Care Body Lotion  Curel Soothing Hands Moisturizing Hand Lotion  Curel Therapeutic Moisturizing Cream, Fragrance-Free  Curel Therapeutic Moisturizing Lotion, Fragrance-Free  Curel Therapeutic Moisturizing Lotion, Original Formula  Eucerin Daily Replenishing Lotion  Eucerin Dry Skin Therapy Plus Alpha Hydroxy Crme  Eucerin Dry Skin Therapy Plus Alpha Hydroxy Lotion  Eucerin Original Crme  Eucerin Original Lotion  Eucerin Plus Crme Eucerin Plus Lotion  Eucerin TriLipid Replenishing Lotion  Keri Anti-Bacterial Hand Lotion  Keri Deep Conditioning Original Lotion Dry Skin Formula Softly Scented  Keri  Deep Conditioning Original Lotion, Fragrance Free Sensitive Skin Formula  Keri Lotion Fast Absorbing Fragrance Free Sensitive Skin Formula  Keri Lotion Fast Absorbing Softly Scented Dry Skin Formula  Keri Original Lotion  Keri Skin Renewal Lotion Keri Silky Smooth Lotion  Keri Silky Smooth Sensitive Skin Lotion  Nivea Body Creamy Conditioning Oil  Nivea Body Extra Enriched Teacher, adult education Moisturizing Lotion Nivea Crme  Nivea Skin Firming Lotion  NutraDerm 30 Skin Lotion  NutraDerm Skin Lotion  NutraDerm Therapeutic Skin Cream  NutraDerm Therapeutic Skin Lotion  ProShield Protective Hand Cream  Facilities manager (Watch  this video at home: [email or web address])  An incentive spirometer is a tool that can help keep your lungs clear and active. This tool measures how well you are filling your lungs with each breath. Taking long deep breaths may help reverse or decrease the chance of developing breathing (pulmonary) problems (especially infection) following: A long period of time when you are unable to move or be active. BEFORE THE PROCEDURE  If the spirometer includes an indicator to show your best effort, your nurse or respiratory therapist will set it to a desired goal. If possible, sit up straight or lean slightly forward. Try not to slouch. Hold the incentive spirometer in an upright position. INSTRUCTIONS FOR USE  Sit on the edge of your bed if possible, or sit up as far as you can in bed or on a chair. Hold the incentive spirometer in an upright position. Breathe out normally. Place the mouthpiece in your mouth and seal your lips tightly around it. Breathe in slowly and as deeply as possible, raising the piston or the ball toward the top of the column. Hold your breath for 3-5 seconds or for as long as possible. Allow the piston or ball to fall to the bottom of the column. Remove the mouthpiece from your mouth and  breathe out normally. Rest for a few seconds and repeat Steps 1 through 7 at least 10 times every 1-2 hours when you are awake. Take your time and take a few normal breaths between deep breaths. The spirometer may include an indicator to show your best effort. Use the indicator as a goal to work toward during each repetition. After each set of 10 deep breaths, practice coughing to be sure your lungs are clear. If you have an incision (the cut made at the time of surgery), support your incision when coughing by placing a pillow or rolled up towels firmly against it. Once you are able to get out of bed, walk around indoors and cough well. You may stop using the incentive spirometer when instructed by your caregiver.  RISKS AND COMPLICATIONS Take your time so you do not get dizzy or light-headed. If you are in pain, you may need to take or ask for pain medication before doing incentive spirometry. It is harder to take a deep breath if you are having pain. AFTER USE Rest and breathe slowly and easily. It can be helpful to keep track of a log of your progress. Your caregiver can provide you with a simple table to help with this. If you are using the spirometer at home, follow these instructions: SEEK MEDICAL CARE IF:  You are having difficultly using the spirometer. You have trouble using the spirometer as often as instructed. Your pain medication is not giving enough relief while using the spirometer. You develop fever of 100.5 F (38.1 C) or higher. SEEK IMMEDIATE MEDICAL CARE IF:  You cough up bloody sputum that had not been present before. You develop fever of 102 F (38.9 C) or greater. You develop worsening pain at or near the incision site. MAKE SURE YOU:  Understand these instructions. Will watch your condition. Will get help right away if you are not doing well or get worse. Document Released: 03/12/2007 Document Revised: 01/22/2012 Document Reviewed: 05/13/2007 [facility] Patient  Information 2014 [place], [place].  Provon moisturizing lotion

## 2024-07-07 NOTE — Progress Notes (Signed)
 Anesthesia Review:  PCP: Cardiologist : Candice [name] LOV 04/03/24  Pulm- [place]re LOV 02/06/24   PPM/ ICD: Device Orders: Rep Notified:  Chest x-ray : EKG : Echo : 05/23/2023  PFT- 10/12/21/22  Stress test: Cardiac Cath :   Activity level:  Sleep Study/ CPAP : Fasting Blood Sugar :      / Checks Blood Sugar -- times a day:    Blood Thinner/ Instructions /Last Dose: ASA / Instructions/ Last Dose :

## 2024-07-09 ENCOUNTER — Encounter (HOSPITAL_COMMUNITY): Payer: Self-pay

## 2024-07-09 NOTE — Progress Notes (Signed)
 For Anesthesia: PCP - Rocky Don, PA-C  Cardiologist - N/A Nephrologist-Bhutani, Gaynell RAMAN, MBBS Pulmonologist-  Neda Jennet LABOR, MD last office visit 02/06/24  Bowel Prep reminder:N/A  Chest x-ray - greater than 1 year EKG - 07/10/24 in [facility] Stress Test - N/A ECHO - N/A Cardiac Cath - N/A Pacemaker/ICD device last checked: N/A Pacemaker orders received:N/A Device Rep notified:N/A  Spinal Cord Stimulator: N/A  Sleep Study - 06/05/23 CPAP - not currently using  Fasting Blood Sugar -  Checks Blood Sugar ___does not check__ Date and result of last Hgb A1c-07/10/24  Last dose of GLP1 agonist- Dulaglutide GLP1 instructions: Do not take after 07/10/24  Last dose of SGLT-2 inhibitors- N/A SGLT-2 instructions: N/A  Blood Thinner Instructions:N/A Aspirin  Instructions:N/A Last Dose:  Activity level: activities of daily living without stopping and without chest pain and/or shortness of breath    Anesthesia review: N/A  Patient denies shortness of breath, fever, cough and chest pain at PAT appointment   Patient verbalized understanding of instructions that were reviewed over the telephone.

## 2024-07-10 ENCOUNTER — Other Ambulatory Visit: Payer: Self-pay

## 2024-07-10 ENCOUNTER — Encounter (HOSPITAL_COMMUNITY): Payer: Self-pay

## 2024-07-10 ENCOUNTER — Encounter (HOSPITAL_COMMUNITY)
Admission: RE | Admit: 2024-07-10 | Discharge: 2024-07-10 | Disposition: A | Discharge status: Home / Self Care | Source: Ambulatory Visit | Attending: Orthopedic Surgery | Admitting: Orthopedic Surgery

## 2024-07-10 VITALS — BP 132/70 | HR 50 | Temp 98.7°F | Resp 16 | Ht 66.0 in | Wt 180.0 lb

## 2024-07-10 DIAGNOSIS — Z01818 Encounter for other preprocedural examination: Secondary | ICD-10-CM | POA: Insufficient documentation

## 2024-07-10 DIAGNOSIS — E119 Type 2 diabetes mellitus without complications: Secondary | ICD-10-CM | POA: Insufficient documentation

## 2024-07-10 DIAGNOSIS — Z794 Long term (current) use of insulin: Secondary | ICD-10-CM | POA: Diagnosis not present

## 2024-07-10 HISTORY — DX: Type 2 diabetes mellitus without complications: E11.9

## 2024-07-10 HISTORY — DX: Polyneuropathy, unspecified: G62.9

## 2024-07-10 HISTORY — DX: Chronic kidney disease, unspecified: N18.9

## 2024-07-10 LAB — BASIC METABOLIC PANEL WITH GFR
Anion gap: 10 (ref 5–15)
BUN: 14 mg/dL (ref 8–23)
CO2: 27 mmol/L (ref 22–32)
Calcium: 10.1 mg/dL (ref 8.9–10.3)
Chloride: 104 mmol/L (ref 98–111)
Creatinine, Ser: 1.08 mg/dL — ABNORMAL HIGH (ref 0.44–1.00)
GFR, Estimated: 53 mL/min — ABNORMAL LOW (ref >60)
Glucose, Bld: 91 mg/dL (ref 70–99)
Potassium: 4.5 mmol/L (ref 3.5–5.1)
Sodium: 140 mmol/L (ref 135–145)

## 2024-07-10 LAB — CBC
HCT: 40.9 % (ref 36.0–46.0)
Hemoglobin: 12.9 g/dL (ref 12.0–15.0)
MCH: 30.4 pg (ref 26.0–34.0)
MCHC: 31.5 g/dL (ref 30.0–36.0)
MCV: 96.2 fL (ref 80.0–100.0)
Platelets: 248 K/uL (ref 150–400)
RBC: 4.25 MIL/uL (ref 3.87–5.11)
RDW: 15.1 % (ref 11.5–15.5)
WBC: 5.7 K/uL (ref 4.0–10.5)
nRBC: 0 % (ref 0.0–0.2)

## 2024-07-10 LAB — SURGICAL PCR SCREEN
MRSA, PCR: NEGATIVE
Staphylococcus aureus: NEGATIVE

## 2024-07-10 LAB — GLUCOSE, CAPILLARY: Glucose-Capillary: 92 mg/dL (ref 70–99)

## 2024-07-11 LAB — HEMOGLOBIN A1C
Hgb A1c MFr Bld: 5.9 % — ABNORMAL HIGH (ref 4.8–5.6)
Mean Plasma Glucose: 123 mg/dL

## 2024-07-17 NOTE — Anesthesia Preprocedure Evaluation (Addendum)
 Anesthesia Evaluation  Patient identified by MRN, date of birth, ID band Patient awake    Reviewed: Allergy & Precautions, NPO status , Patient's Chart, lab work & pertinent test results  History of Anesthesia Complications Negative for: history of anesthetic complications  Airway Mallampati: II  TM Distance: >3 FB Neck ROM: Full    Dental no notable dental hx. (+) Teeth Intact, Dental Advisory Given   Pulmonary asthma , sleep apnea , former smoker   Pulmonary exam normal breath sounds clear to auscultation       Cardiovascular hypertension, Pt. on home beta blockers and Pt. on medications (-) angina (-) Past MI Normal cardiovascular exam Rhythm:Regular Rate:Normal  Nl Lv   Neuro/Psych  PSYCHIATRIC DISORDERS Anxiety Depression       GI/Hepatic ,GERD  Medicated and Controlled,,  Endo/Other  diabetes, Type 2    Renal/GU Lab Results      Component                Value               Date                            K                        4.5                 07/10/2024                CO2                      27                  07/10/2024                BUN                      14                  07/10/2024                CREATININE               1.08 (H)            07/10/2024                GFRNONAA                 53 (L)              07/10/2024                   Musculoskeletal  (+) Arthritis , Osteoarthritis,    Abdominal   Peds  Hematology Lab Results      Component                Value               Date                      WBC                      5.7                 07/10/2024  HGB                      12.9                07/10/2024                HCT                      40.9                07/10/2024                MCV                      96.2                07/10/2024                PLT                      248                 07/10/2024              Anesthesia Other Findings All:  codeine and sulfa  Reproductive/Obstetrics                              Anesthesia Physical Anesthesia Plan  ASA: 3  Anesthesia Plan: Spinal and Regional   Post-op Pain Management: Minimal or no pain anticipated, Regional block*, Precedex  and Ofirmev  IV (intra-op)*   Induction: Intravenous  PONV Risk Score and Plan: 3 and Treatment may vary due to age or medical condition, Ondansetron  and Propofol  infusion  Airway Management Planned: Nasal Cannula and Natural Airway  Additional Equipment: None  Intra-op Plan:   Post-operative Plan:   Informed Consent:      Dental advisory given  Plan Discussed with: CRNA and Surgeon  Anesthesia Plan Comments: (Spinal w R adductor canal)         Anesthesia Quick Evaluation

## 2024-07-18 ENCOUNTER — Encounter (HOSPITAL_COMMUNITY): Payer: Self-pay | Admitting: Orthopedic Surgery

## 2024-07-18 ENCOUNTER — Other Ambulatory Visit: Payer: Self-pay

## 2024-07-18 ENCOUNTER — Encounter (HOSPITAL_COMMUNITY): Payer: Self-pay | Admitting: Anesthesiology

## 2024-07-18 ENCOUNTER — Observation Stay (HOSPITAL_COMMUNITY)
Admission: RE | Admit: 2024-07-18 | Discharge: 2024-07-19 | Disposition: A | Discharge status: Home / Self Care | Attending: Orthopedic Surgery | Admitting: Orthopedic Surgery

## 2024-07-18 ENCOUNTER — Ambulatory Visit (HOSPITAL_COMMUNITY)
Admission: RE | Disposition: A | Discharge status: Home / Self Care | Payer: Self-pay | Source: Home / Self Care | Attending: Orthopedic Surgery

## 2024-07-18 ENCOUNTER — Ambulatory Visit (HOSPITAL_COMMUNITY): Payer: Self-pay | Admitting: Anesthesiology

## 2024-07-18 DIAGNOSIS — Z96651 Presence of right artificial knee joint: Principal | ICD-10-CM | POA: Insufficient documentation

## 2024-07-18 DIAGNOSIS — K219 Gastro-esophageal reflux disease without esophagitis: Secondary | ICD-10-CM | POA: Diagnosis not present

## 2024-07-18 DIAGNOSIS — M1711 Unilateral primary osteoarthritis, right knee: Principal | ICD-10-CM | POA: Insufficient documentation

## 2024-07-18 DIAGNOSIS — N189 Chronic kidney disease, unspecified: Secondary | ICD-10-CM

## 2024-07-18 DIAGNOSIS — Z87891 Personal history of nicotine dependence: Secondary | ICD-10-CM | POA: Insufficient documentation

## 2024-07-18 DIAGNOSIS — I129 Hypertensive chronic kidney disease with stage 1 through stage 4 chronic kidney disease, or unspecified chronic kidney disease: Secondary | ICD-10-CM | POA: Diagnosis not present

## 2024-07-18 DIAGNOSIS — J45909 Unspecified asthma, uncomplicated: Secondary | ICD-10-CM | POA: Insufficient documentation

## 2024-07-18 DIAGNOSIS — F1090 Alcohol use, unspecified, uncomplicated: Secondary | ICD-10-CM | POA: Diagnosis not present

## 2024-07-18 DIAGNOSIS — I10 Essential (primary) hypertension: Secondary | ICD-10-CM | POA: Diagnosis not present

## 2024-07-18 DIAGNOSIS — E1122 Type 2 diabetes mellitus with diabetic chronic kidney disease: Secondary | ICD-10-CM

## 2024-07-18 DIAGNOSIS — E119 Type 2 diabetes mellitus without complications: Secondary | ICD-10-CM

## 2024-07-18 DIAGNOSIS — Z79899 Other long term (current) drug therapy: Secondary | ICD-10-CM | POA: Diagnosis not present

## 2024-07-18 DIAGNOSIS — M1712 Unilateral primary osteoarthritis, left knee: Secondary | ICD-10-CM | POA: Diagnosis not present

## 2024-07-18 DIAGNOSIS — M25561 Pain in right knee: Secondary | ICD-10-CM | POA: Diagnosis present

## 2024-07-18 DIAGNOSIS — G8918 Other acute postprocedural pain: Secondary | ICD-10-CM | POA: Diagnosis not present

## 2024-07-18 HISTORY — DX: Prediabetes: R73.03

## 2024-07-18 HISTORY — PX: TOTAL KNEE ARTHROPLASTY: SHX125

## 2024-07-18 SURGERY — ARTHROPLASTY, KNEE, TOTAL
Anesthesia: Regional | Site: Knee | Laterality: Right

## 2024-07-18 MED ORDER — FLUTICASONE PROPIONATE 50 MCG/ACT NA SUSP
1.0000 | Freq: Every day | NASAL | Status: DC
  Administered 2024-07-19: 1 via NASAL
  Filled 2024-07-18: qty 16

## 2024-07-18 MED ORDER — TRAMADOL HCL 50 MG PO TABS
50.0000 mg | ORAL_TABLET | Freq: Four times a day (QID) | ORAL | Status: DC | PRN
  Administered 2024-07-18 – 2024-07-19 (×2): 50 mg via ORAL
  Filled 2024-07-18 (×2): qty 1

## 2024-07-18 MED ORDER — DONEPEZIL HCL 10 MG PO TABS
5.0000 mg | ORAL_TABLET | Freq: Every day | ORAL | Status: DC
  Administered 2024-07-18: 5 mg via ORAL
  Filled 2024-07-18: qty 1

## 2024-07-18 MED ORDER — OXYCODONE HCL 5 MG PO TABS
5.0000 mg | ORAL_TABLET | Freq: Three times a day (TID) | ORAL | 0 refills | Status: AC | PRN
Start: 2024-07-18 — End: 2025-07-18

## 2024-07-18 MED ORDER — ONDANSETRON HCL 4 MG/2ML IJ SOLN: 4.0000 mg | Freq: Once | INTRAMUSCULAR | Status: DC | PRN

## 2024-07-18 MED ORDER — DULAGLUTIDE 4.5 MG/0.5ML ~~LOC~~ SOAJ: 4.5000 mg | SUBCUTANEOUS | Status: DC

## 2024-07-18 MED ORDER — BUDESON-GLYCOPYRROL-FORMOTEROL 160-9-4.8 MCG/ACT IN AERO
2.0000 | INHALATION_SPRAY | Freq: Two times a day (BID) | RESPIRATORY_TRACT | Status: DC
  Administered 2024-07-18 – 2024-07-19 (×2): 2 via RESPIRATORY_TRACT
  Filled 2024-07-18: qty 5.9

## 2024-07-18 MED ORDER — EPHEDRINE 5 MG/ML INJ
INTRAVENOUS | Status: AC
  Filled 2024-07-18: qty 5

## 2024-07-18 MED ORDER — FENTANYL CITRATE PF 50 MCG/ML IJ SOSY
PREFILLED_SYRINGE | INTRAMUSCULAR | Status: AC
  Filled 2024-07-18: qty 1

## 2024-07-18 MED ORDER — SODIUM CHLORIDE 0.9 % IV SOLN: INTRAVENOUS | Status: AC

## 2024-07-18 MED ORDER — ONDANSETRON HCL 4 MG PO TABS: 4.0000 mg | ORAL_TABLET | Freq: Four times a day (QID) | ORAL | Status: DC | PRN

## 2024-07-18 MED ORDER — IPRATROPIUM-ALBUTEROL 0.5-2.5 (3) MG/3ML IN SOLN: 3.0000 mL | Freq: Four times a day (QID) | RESPIRATORY_TRACT | Status: DC | PRN

## 2024-07-18 MED ORDER — ACETAMINOPHEN 10 MG/ML IV SOLN
INTRAVENOUS | Status: AC
  Filled 2024-07-18: qty 100

## 2024-07-18 MED ORDER — POLYETHYLENE GLYCOL 3350 17 G PO PACK: 17.0000 g | PACK | Freq: Every day | ORAL | Status: DC | PRN

## 2024-07-18 MED ORDER — ACETAMINOPHEN 10 MG/ML IV SOLN
1000.0000 mg | Freq: Once | INTRAVENOUS | Status: DC | PRN
  Administered 2024-07-18: 1000 mg via INTRAVENOUS

## 2024-07-18 MED ORDER — METHOCARBAMOL 500 MG PO TABS: 500.0000 mg | ORAL_TABLET | Freq: Three times a day (TID) | ORAL | 1 refills | Status: AC | PRN

## 2024-07-18 MED ORDER — CITALOPRAM HYDROBROMIDE 20 MG PO TABS
20.0000 mg | ORAL_TABLET | Freq: Every day | ORAL | Status: DC
  Administered 2024-07-19: 20 mg via ORAL
  Filled 2024-07-18: qty 1

## 2024-07-18 MED ORDER — PHENOL 1.4 % MT LIQD: 1.0000 | OROMUCOSAL | Status: DC | PRN

## 2024-07-18 MED ORDER — PANTOPRAZOLE SODIUM 40 MG PO TBEC
40.0000 mg | DELAYED_RELEASE_TABLET | Freq: Two times a day (BID) | ORAL | Status: DC
  Administered 2024-07-18 – 2024-07-19 (×2): 40 mg via ORAL
  Filled 2024-07-18 (×2): qty 1

## 2024-07-18 MED ORDER — BUPIVACAINE LIPOSOME 1.3 % IJ SUSP: 20.0000 mL | Freq: Once | INTRAMUSCULAR | Status: DC

## 2024-07-18 MED ORDER — ASPIRIN 81 MG PO CHEW: 81.0000 mg | CHEWABLE_TABLET | Freq: Two times a day (BID) | ORAL | 0 refills | Status: AC

## 2024-07-18 MED ORDER — LINACLOTIDE 145 MCG PO CAPS: 145.0000 ug | ORAL_CAPSULE | Freq: Every day | ORAL | Status: DC | PRN

## 2024-07-18 MED ORDER — TRANEXAMIC ACID-NACL 1000-0.7 MG/100ML-% IV SOLN
1000.0000 mg | Freq: Once | INTRAVENOUS | Status: AC
  Administered 2024-07-18: 1000 mg via INTRAVENOUS
  Filled 2024-07-18: qty 100

## 2024-07-18 MED ORDER — NAPROXEN 250 MG PO TABS
250.0000 mg | ORAL_TABLET | Freq: Three times a day (TID) | ORAL | Status: DC
  Administered 2024-07-18 – 2024-07-19 (×3): 250 mg via ORAL
  Filled 2024-07-18 (×3): qty 1

## 2024-07-18 MED ORDER — EPHEDRINE SULFATE-NACL 50-0.9 MG/10ML-% IV SOSY
PREFILLED_SYRINGE | INTRAVENOUS | Status: DC | PRN
  Administered 2024-07-18 (×3): 5 mg via INTRAVENOUS
  Administered 2024-07-18: 10 mg via INTRAVENOUS

## 2024-07-18 MED ORDER — ONDANSETRON HCL 4 MG/2ML IJ SOLN
INTRAMUSCULAR | Status: AC
  Filled 2024-07-18: qty 2

## 2024-07-18 MED ORDER — MAGNESIUM CITRATE PO SOLN
1.0000 | Freq: Every day | ORAL | Status: DC
  Filled 2024-07-18: qty 296

## 2024-07-18 MED ORDER — DEXAMETHASONE SODIUM PHOSPHATE 10 MG/ML IJ SOLN
INTRAMUSCULAR | Status: AC
  Filled 2024-07-18: qty 1

## 2024-07-18 MED ORDER — OXYCODONE HCL 5 MG PO TABS
5.0000 mg | ORAL_TABLET | ORAL | Status: DC | PRN
  Administered 2024-07-18 – 2024-07-19 (×5): 10 mg via ORAL
  Filled 2024-07-18 (×5): qty 2

## 2024-07-18 MED ORDER — DEXAMETHASONE SODIUM PHOSPHATE 10 MG/ML IJ SOLN
INTRAMUSCULAR | Status: DC | PRN
  Administered 2024-07-18: 10 mg via INTRAVENOUS

## 2024-07-18 MED ORDER — ASPIRIN 81 MG PO CHEW
81.0000 mg | CHEWABLE_TABLET | Freq: Two times a day (BID) | ORAL | Status: DC
  Administered 2024-07-18 – 2024-07-19 (×2): 81 mg via ORAL
  Filled 2024-07-18 (×2): qty 1

## 2024-07-18 MED ORDER — ONDANSETRON HCL 4 MG PO TABS: 4.0000 mg | ORAL_TABLET | Freq: Three times a day (TID) | ORAL | 1 refills | Status: AC | PRN

## 2024-07-18 MED ORDER — FENTANYL CITRATE PF 50 MCG/ML IJ SOSY
25.0000 ug | PREFILLED_SYRINGE | INTRAMUSCULAR | Status: DC | PRN
  Administered 2024-07-18: 50 ug via INTRAVENOUS

## 2024-07-18 MED ORDER — CHLORHEXIDINE GLUCONATE 0.12 % MT SOLN
15.0000 mL | Freq: Once | OROMUCOSAL | Status: AC
  Administered 2024-07-18: 15 mL via OROMUCOSAL

## 2024-07-18 MED ORDER — HYDROMORPHONE HCL 1 MG/ML IJ SOLN
0.5000 mg | INTRAMUSCULAR | Status: DC | PRN
  Administered 2024-07-18: 1 mg via INTRAVENOUS
  Filled 2024-07-18: qty 1

## 2024-07-18 MED ORDER — VITAMIN B-12 1000 MCG PO TABS
500.0000 ug | ORAL_TABLET | Freq: Every day | ORAL | Status: DC
  Administered 2024-07-19: 500 ug via ORAL
  Filled 2024-07-18: qty 1

## 2024-07-18 MED ORDER — METHOCARBAMOL 500 MG PO TABS
500.0000 mg | ORAL_TABLET | Freq: Four times a day (QID) | ORAL | Status: DC | PRN
Start: 2024-07-18 — End: 2024-07-19
  Administered 2024-07-18: 500 mg via ORAL
  Filled 2024-07-18: qty 1

## 2024-07-18 MED ORDER — 0.9 % SODIUM CHLORIDE (POUR BTL) OPTIME
TOPICAL | Status: DC | PRN
  Administered 2024-07-18: 1000 mL

## 2024-07-18 MED ORDER — CLONIDINE HCL (ANALGESIA) 100 MCG/ML EP SOLN
EPIDURAL | Status: DC | PRN
  Administered 2024-07-18: 100 ug

## 2024-07-18 MED ORDER — ROPIVACAINE HCL 5 MG/ML IJ SOLN
INTRAMUSCULAR | Status: DC | PRN
  Administered 2024-07-18: 30 mL via PERINEURAL

## 2024-07-18 MED ORDER — METOCLOPRAMIDE HCL 5 MG PO TABS: 5.0000 mg | ORAL_TABLET | Freq: Three times a day (TID) | ORAL | Status: DC | PRN

## 2024-07-18 MED ORDER — SPIRONOLACTONE 12.5 MG HALF TABLET
12.5000 mg | ORAL_TABLET | Freq: Every day | ORAL | Status: DC
  Administered 2024-07-19: 12.5 mg via ORAL
  Filled 2024-07-18: qty 1

## 2024-07-18 MED ORDER — CEFAZOLIN SODIUM-DEXTROSE 2-4 GM/100ML-% IV SOLN
2.0000 g | INTRAVENOUS | Status: AC
  Administered 2024-07-18: 2 g via INTRAVENOUS
  Filled 2024-07-18: qty 100

## 2024-07-18 MED ORDER — PHENYLEPHRINE 80 MCG/ML (10ML) SYRINGE FOR IV PUSH (FOR BLOOD PRESSURE SUPPORT)
PREFILLED_SYRINGE | INTRAVENOUS | Status: AC
  Filled 2024-07-18: qty 10

## 2024-07-18 MED ORDER — ALBUTEROL SULFATE HFA 108 (90 BASE) MCG/ACT IN AERS
1.0000 | INHALATION_SPRAY | Freq: Four times a day (QID) | RESPIRATORY_TRACT | Status: DC | PRN
  Administered 2024-07-18: 2 via RESPIRATORY_TRACT
  Filled 2024-07-18: qty 6.7

## 2024-07-18 MED ORDER — PROPOFOL 500 MG/50ML IV EMUL
INTRAVENOUS | Status: DC | PRN
  Administered 2024-07-18: 50 ug/kg/min via INTRAVENOUS
  Administered 2024-07-18 (×2): 30 mg via INTRAVENOUS

## 2024-07-18 MED ORDER — BUPIVACAINE IN DEXTROSE 0.75-8.25 % IT SOLN
INTRATHECAL | Status: DC | PRN
Start: 2024-07-18 — End: 2024-07-18
  Administered 2024-07-18: 12 mg via INTRATHECAL

## 2024-07-18 MED ORDER — VITAMIN D 25 MCG (1000 UNIT) PO TABS
1000.0000 [IU] | ORAL_TABLET | Freq: Every day | ORAL | Status: DC
  Administered 2024-07-19: 1000 [IU] via ORAL
  Filled 2024-07-18: qty 1

## 2024-07-18 MED ORDER — GABAPENTIN 300 MG PO CAPS
300.0000 mg | ORAL_CAPSULE | Freq: Every day | ORAL | Status: DC
  Administered 2024-07-18: 300 mg via ORAL
  Filled 2024-07-18: qty 1

## 2024-07-18 MED ORDER — BUDESONIDE 0.25 MG/2ML IN SUSP
0.2500 mg | Freq: Two times a day (BID) | RESPIRATORY_TRACT | Status: DC
  Administered 2024-07-18 – 2024-07-19 (×2): 0.25 mg via RESPIRATORY_TRACT
  Filled 2024-07-18 (×2): qty 2

## 2024-07-18 MED ORDER — MENTHOL 3 MG MT LOZG: 1.0000 | LOZENGE | OROMUCOSAL | Status: DC | PRN

## 2024-07-18 MED ORDER — CARVEDILOL 12.5 MG PO TABS
12.5000 mg | ORAL_TABLET | Freq: Two times a day (BID) | ORAL | Status: DC
  Administered 2024-07-18 – 2024-07-19 (×2): 12.5 mg via ORAL
  Filled 2024-07-18 (×2): qty 1

## 2024-07-18 MED ORDER — METOCLOPRAMIDE HCL 5 MG/ML IJ SOLN: 5.0000 mg | Freq: Three times a day (TID) | INTRAMUSCULAR | Status: DC | PRN

## 2024-07-18 MED ORDER — CEFAZOLIN SODIUM-DEXTROSE 2-4 GM/100ML-% IV SOLN
2.0000 g | Freq: Four times a day (QID) | INTRAVENOUS | Status: AC
  Administered 2024-07-18 (×2): 2 g via INTRAVENOUS
  Filled 2024-07-18 (×2): qty 100

## 2024-07-18 MED ORDER — TRANEXAMIC ACID-NACL 1000-0.7 MG/100ML-% IV SOLN
1000.0000 mg | INTRAVENOUS | Status: AC
  Administered 2024-07-18: 1000 mg via INTRAVENOUS
  Filled 2024-07-18: qty 100

## 2024-07-18 MED ORDER — PROPOFOL 1000 MG/100ML IV EMUL
INTRAVENOUS | Status: AC
  Filled 2024-07-18: qty 100

## 2024-07-18 MED ORDER — POVIDONE-IODINE 10 % EX SWAB
2.0000 | Freq: Once | CUTANEOUS | Status: AC
  Administered 2024-07-18: 2 via TOPICAL

## 2024-07-18 MED ORDER — SODIUM CHLORIDE 0.9 % IR SOLN
Status: DC | PRN
  Administered 2024-07-18: 1000 mL

## 2024-07-18 MED ORDER — SODIUM CHLORIDE (PF) 0.9 % IJ SOLN
INTRAMUSCULAR | Status: AC
  Filled 2024-07-18: qty 50

## 2024-07-18 MED ORDER — FENTANYL CITRATE PF 50 MCG/ML IJ SOSY
50.0000 ug | PREFILLED_SYRINGE | INTRAMUSCULAR | Status: DC | PRN
  Administered 2024-07-18: 50 ug via INTRAVENOUS
  Filled 2024-07-18: qty 2

## 2024-07-18 MED ORDER — LACTATED RINGERS IV SOLN: INTRAVENOUS | Status: DC

## 2024-07-18 MED ORDER — ACYCLOVIR 400 MG PO TABS
400.0000 mg | ORAL_TABLET | Freq: Every day | ORAL | Status: DC
  Administered 2024-07-19: 400 mg via ORAL
  Filled 2024-07-18: qty 1

## 2024-07-18 MED ORDER — ONDANSETRON HCL 4 MG/2ML IJ SOLN: 4.0000 mg | Freq: Four times a day (QID) | INTRAMUSCULAR | Status: DC | PRN

## 2024-07-18 MED ORDER — FAMOTIDINE 20 MG PO TABS
40.0000 mg | ORAL_TABLET | Freq: Every day | ORAL | Status: DC
  Administered 2024-07-18 – 2024-07-19 (×2): 40 mg via ORAL
  Filled 2024-07-18 (×2): qty 2

## 2024-07-18 MED ORDER — ACETAMINOPHEN 325 MG PO TABS: 325.0000 mg | ORAL_TABLET | Freq: Four times a day (QID) | ORAL | Status: DC | PRN

## 2024-07-18 MED ORDER — BUPIVACAINE-EPINEPHRINE (PF) 0.25% -1:200000 IJ SOLN
INTRAMUSCULAR | Status: AC
  Filled 2024-07-18: qty 30

## 2024-07-18 MED ORDER — BUPIVACAINE LIPOSOME 1.3 % IJ SUSP
INTRAMUSCULAR | Status: DC | PRN
  Administered 2024-07-18: 80 mL

## 2024-07-18 MED ORDER — ORAL CARE MOUTH RINSE: 15.0000 mL | Freq: Once | OROMUCOSAL | Status: AC

## 2024-07-18 MED ORDER — MIDAZOLAM HCL 2 MG/2ML IJ SOLN: 1.0000 mg | INTRAMUSCULAR | Status: DC | PRN

## 2024-07-18 MED ORDER — ONDANSETRON HCL 4 MG/2ML IJ SOLN
INTRAMUSCULAR | Status: DC | PRN
  Administered 2024-07-18: 4 mg via INTRAVENOUS

## 2024-07-18 MED ORDER — LORATADINE 10 MG PO TABS
10.0000 mg | ORAL_TABLET | Freq: Every day | ORAL | Status: DC
  Administered 2024-07-18 – 2024-07-19 (×2): 10 mg via ORAL
  Filled 2024-07-18 (×2): qty 1

## 2024-07-18 MED ORDER — DOCUSATE SODIUM 100 MG PO CAPS
100.0000 mg | ORAL_CAPSULE | Freq: Two times a day (BID) | ORAL | Status: DC
  Administered 2024-07-18 – 2024-07-19 (×2): 100 mg via ORAL
  Filled 2024-07-18 (×2): qty 1

## 2024-07-18 MED ORDER — METHOCARBAMOL 1000 MG/10ML IJ SOLN: 500.0000 mg | Freq: Four times a day (QID) | INTRAMUSCULAR | Status: DC | PRN

## 2024-07-18 MED ORDER — EZETIMIBE 10 MG PO TABS
10.0000 mg | ORAL_TABLET | Freq: Every day | ORAL | Status: DC
  Administered 2024-07-19: 10 mg via ORAL
  Filled 2024-07-18: qty 1

## 2024-07-18 MED ORDER — BUPIVACAINE LIPOSOME 1.3 % IJ SUSP
INTRAMUSCULAR | Status: AC
  Filled 2024-07-18: qty 20

## 2024-07-18 SURGICAL SUPPLY — 44 items
ATTUNE MED DOME PAT 38 KNEE (Knees) IMPLANT
ATTUNE PS FEM RT SZ 5 CEM KNEE (Femur) IMPLANT
ATTUNE PSRP INSR SZ5 8 KNEE (Insert) IMPLANT
BAG COUNTER SPONGE SURGICOUNT (BAG) IMPLANT
BAG ZIPLOCK 12X15 (MISCELLANEOUS) ×1 IMPLANT
BASE TIBIAL ROT PLAT SZ 5 KNEE (Knees) IMPLANT
BLADE SAG 18X100X1.27 (BLADE) ×1 IMPLANT
BLADE SAW SGTL 13X75X1.27 (BLADE) ×1 IMPLANT
BNDG ELASTIC 6X10 VLCR STRL LF (GAUZE/BANDAGES/DRESSINGS) ×1 IMPLANT
BNDG GAUZE DERMACEA FLUFF 4 (GAUZE/BANDAGES/DRESSINGS) ×1 IMPLANT
BOWL SMART MIX CTS (DISPOSABLE) ×1 IMPLANT
CEMENT HV SMART SET (Cement) ×2 IMPLANT
COVER SURGICAL LIGHT HANDLE (MISCELLANEOUS) ×1 IMPLANT
CUFF TRNQT CYL 34X4.125X (TOURNIQUET CUFF) ×1 IMPLANT
DRAPE SHEET LG 3/4 BI-LAMINATE (DRAPES) ×1 IMPLANT
DRAPE U-SHAPE 47X51 STRL (DRAPES) ×1 IMPLANT
DRSG ADAPTIC 3X8 NADH LF (GAUZE/BANDAGES/DRESSINGS) ×1 IMPLANT
DURAPREP 26ML APPLICATOR (WOUND CARE) ×1 IMPLANT
ELECT PENCIL ROCKER SW 15FT (MISCELLANEOUS) ×1 IMPLANT
ELECT REM PT RETURN 15FT ADLT (MISCELLANEOUS) ×1 IMPLANT
GAUZE PAD ABD 8X10 STRL (GAUZE/BANDAGES/DRESSINGS) ×1 IMPLANT
GAUZE SPONGE 4X4 12PLY STRL (GAUZE/BANDAGES/DRESSINGS) ×1 IMPLANT
GLOVE BIOGEL PI IND STRL 7.5 (GLOVE) ×1 IMPLANT
GLOVE BIOGEL PI IND STRL 8.5 (GLOVE) ×1 IMPLANT
GLOVE ORTHO TXT STRL SZ7.5 (GLOVE) ×1 IMPLANT
GLOVE SURG ORTHO 8.5 STRL (GLOVE) ×1 IMPLANT
GOWN STRL REUS W/ TWL XL LVL3 (GOWN DISPOSABLE) ×2 IMPLANT
IMMOBILIZER KNEE 20 THIGH 36 (SOFTGOODS) ×1 IMPLANT
KIT TURNOVER KIT A (KITS) ×1 IMPLANT
MANIFOLD NEPTUNE II (INSTRUMENTS) ×1 IMPLANT
NS IRRIG 1000ML POUR BTL (IV SOLUTION) ×1 IMPLANT
PACK TOTAL KNEE CUSTOM (KITS) ×1 IMPLANT
PIN STEINMAN FIXATION KNEE (PIN) IMPLANT
PROTECTOR NERVE ULNAR (MISCELLANEOUS) ×1 IMPLANT
SET HNDPC FAN SPRY TIP SCT (DISPOSABLE) ×1 IMPLANT
STRIP CLOSURE SKIN 1/2X4 (GAUZE/BANDAGES/DRESSINGS) ×2 IMPLANT
SUT MNCRL AB 3-0 PS2 18 (SUTURE) ×1 IMPLANT
SUT VIC AB 0 CT1 36 (SUTURE) ×1 IMPLANT
SUT VIC AB 1 CT1 36 (SUTURE) ×2 IMPLANT
SUT VIC AB 2-0 CT1 TAPERPNT 27 (SUTURE) ×1 IMPLANT
TOWEL GREEN STERILE FF (TOWEL DISPOSABLE) ×1 IMPLANT
TRAY CATH INTERMITTENT SS 16FR (CATHETERS) ×1 IMPLANT
WATER STERILE IRR 1000ML POUR (IV SOLUTION) ×2 IMPLANT
YANKAUER SUCT BULB TIP NO VENT (SUCTIONS) ×1 IMPLANT

## 2024-07-18 NOTE — Progress Notes (Signed)
 Orthopedic Tech Progress Note Patient Details:  ADISYN RUSCITTI [birth year] [phone] CPM will be removed at 1710 CPM Right Knee CPM Right Knee: On Right Knee Flexion (Degrees): 80 Right Knee Extension (Degrees): 0  Post Interventions Patient Tolerated: Well Ortho Devices Type of Ortho Device: Bone foam zero knee Ortho Device/Splint Location: RLE Ortho Device/Splint Interventions: Application   Post Interventions Patient Tolerated: Well  Massie BRAVO Aamilah Augenstein 07/18/2024, 1:14 PM

## 2024-07-18 NOTE — Plan of Care (Signed)

## 2024-07-18 NOTE — Anesthesia Procedure Notes (Signed)
 Anesthesia Regional Block: Adductor canal block   Pre-Anesthetic Checklist: , timeout performed,  Correct Patient, Correct Site, Correct Laterality,  Correct Procedure, Correct Position, site marked,  Risks and benefits discussed,  Surgical consent,  Pre-op evaluation,  At surgeon's request and post-op pain management  Laterality: Lower and Right  Prep: chloraprep       Needles:  Injection technique: Single-shot  Needle Type: Echogenic Needle     Needle Length: 9cm  Needle Gauge: 22     Additional Needles:   Procedures:,,,, ultrasound used (permanent image in chart),,    Narrative:  Start time: 07/18/2024 8:15 AM End time: 07/18/2024 8:19 AM Injection made incrementally with aspirations every 5 mL.  Performed by: Personally  Anesthesiologist: Jefm Garnette LABOR, MD  Additional Notes: Block assessed prior to surgery. Pt tolerated procedure well.

## 2024-07-18 NOTE — Discharge Instructions (Signed)
 Ice to the knee constantly.  Keep the Aquacel bandage on for one week, then ok to remove it. Ok to shower with it on and sealed.   Do exercise as instructed every hour, please to prevent stiffness.    DO NOT prop anything under the knee, it will make your knee stiff.  Prop under the ankle to encourage your knee to go straight.   Use the walker while you are up and around for balance.  Wear your support stockings 24/7 to prevent blood clots and take baby aspirin  twice daily for 30 days also to prevent blood clots  Follow up with Dr Kay in two weeks in the office, call [phone] for appt  Please call Dr Kay (Cell) [phone] with any questions or concerns  INSTRUCTIONS AFTER JOINT REPLACEMENT   Remove items at home which could result in a fall. This includes throw rugs or furniture in walking pathways ICE to the affected joint every three hours while awake for 30 minutes at a time, for at least the first 3-5 days, and then as needed for pain and swelling.  Continue to use ice for pain and swelling. You may notice swelling that will progress down to the foot and ankle.  This is normal after surgery.  Elevate your leg when you are not up walking on it.   Continue to use the breathing machine you got in the hospital (incentive spirometer) which will help keep your temperature down.  It is common for your temperature to cycle up and down following surgery, especially at night when you are not up moving around and exerting yourself.  The breathing machine keeps your lungs expanded and your temperature down.   DIET:  As you were doing prior to hospitalization, we recommend a well-balanced diet.  DRESSING / WOUND CARE / SHOWERING  Please leave the Aquacel bandage on for one week. OK to shower with the bandage on. Remove that and leave the incision open to air after one week  ACTIVITY  Increase activity slowly as tolerated, but follow the weight bearing instructions below.   No driving  for 6 weeks or until further direction given by your physician.  You cannot drive while taking narcotics.  No lifting or carrying greater than 10 lbs. until further directed by your surgeon. Avoid periods of inactivity such as sitting longer than an hour when not asleep. This helps prevent blood clots.  You may return to work once you are authorized by your doctor.     WEIGHT BEARING   Weight bearing as tolerated with assist device (walker, cane, etc) as directed, use it as long as suggested by your surgeon or therapist, typically at least 4-6 weeks.   EXERCISES  Results after joint replacement surgery are often greatly improved when you follow the exercise, range of motion and muscle strengthening exercises prescribed by your doctor. Safety measures are also important to protect the joint from further injury. Any time any of these exercises cause you to have increased pain or swelling, decrease what you are doing until you are comfortable again and then slowly increase them. If you have problems or questions, call your caregiver or physical therapist for advice.   Rehabilitation is important following a joint replacement. After just a few days of immobilization, the muscles of the leg can become weakened and shrink (atrophy).  These exercises are designed to build up the tone and strength of the thigh and leg muscles and to improve motion. Often times  heat used for twenty to thirty minutes before working out will loosen up your tissues and help with improving the range of motion but do not use heat for the first two weeks following surgery (sometimes heat can increase post-operative swelling).   These exercises can be done on a training (exercise) mat, on the floor, on a table or on a bed. Use whatever works the best and is most comfortable for you.    Use music or television while you are exercising so that the exercises are a pleasant break in your day. This will make your life better with the  exercises acting as a break in your routine that you can look forward to.   Perform all exercises about fifteen times, three times per day or as directed.  You should exercise both the operative leg and the other leg as well.  Exercises include:   Quad Sets - Tighten up the muscle on the front of the thigh (Quad) and hold for 5-10 seconds.   Straight Leg Raises - With your knee straight (if you were given a brace, keep it on), lift the leg to 60 degrees, hold for 3 seconds, and slowly lower the leg.  Perform this exercise against resistance later as your leg gets stronger.  Leg Slides: Lying on your back, slowly slide your foot toward your buttocks, bending your knee up off the floor (only go as far as is comfortable). Then slowly slide your foot back down until your leg is flat on the floor again.  Angel Wings: Lying on your back spread your legs to the side as far apart as you can without causing discomfort.  Hamstring Strength:  Lying on your back, push your heel against the floor with your leg straight by tightening up the muscles of your buttocks.  Repeat, but this time bend your knee to a comfortable angle, and push your heel against the floor.  You may put a pillow under the heel to make it more comfortable if necessary.   A rehabilitation program following joint replacement surgery can speed recovery and prevent re-injury in the future due to weakened muscles. Contact your doctor or a physical therapist for more information on knee rehabilitation.    CONSTIPATION  Constipation is defined medically as fewer than three stools per week and severe constipation as less than one stool per week.  Even if you have a regular bowel pattern at home, your normal regimen is likely to be disrupted due to multiple reasons following surgery.  Combination of anesthesia, postoperative narcotics, change in appetite and fluid intake all can affect your bowels.   YOU MUST use at least one of the following  options; they are listed in order of increasing strength to get the job done.  They are all available over the counter, and you may need to use some, POSSIBLY even all of these options:    Drink plenty of fluids (prune juice may be helpful) and high fiber foods Colace 100 mg by mouth twice a day  Senokot for constipation as directed and as needed Dulcolax (bisacodyl), take with full glass of water   Miralax  (polyethylene glycol) once or twice a day as needed.  If you have tried all these things and are unable to have a bowel movement in the first 3-4 days after surgery call either your surgeon or your primary doctor.    If you experience loose stools or diarrhea, hold the medications until you stool forms back up.  If your  symptoms do not get better within 1 week or if they get worse, check with your doctor.  If you experience the worst abdominal pain ever or develop nausea or vomiting, please contact the office immediately for further recommendations for treatment.   ITCHING:  If you experience itching with your medications, try taking only a single pain pill, or even half a pain pill at a time.  You can also use Benadryl  over the counter for itching or also to help with sleep.   TED HOSE STOCKINGS:  Use stockings on both legs until for at least 2 weeks or as directed by physician office. They may be removed at night for sleeping.  MEDICATIONS:  See your medication summary on the "After Visit Summary" that nursing will review with you.  You may have some home medications which will be placed on hold until you complete the course of blood thinner medication.  It is important for you to complete the blood thinner medication as prescribed.  PRECAUTIONS:  If you experience chest pain or shortness of breath - call 911 immediately for transfer to the hospital emergency department.   If you develop a fever greater that 101 F, purulent drainage from wound, increased redness or drainage from wound, foul  odor from the wound/dressing, or calf pain - CONTACT YOUR SURGEON.                                                   FOLLOW-UP APPOINTMENTS:  If you do not already have a post-op appointment, please call the office for an appointment to be seen by your surgeon.  Guidelines for how soon to be seen are listed in your "After Visit Summary", but are typically between 1-4 weeks after surgery.  OTHER INSTRUCTIONS:   Knee Replacement:  Do not place pillow under knee, focus on keeping the knee straight while resting. CPM instructions: 0-90 degrees, 2 hours in the morning, 2 hours in the afternoon, and 2 hours in the evening. Place foam block, curve side up under heel at all times except when in CPM or when walking.  DO NOT modify, tear, cut, or change the foam block in any way.  POST-OPERATIVE OPIOID TAPER INSTRUCTIONS: It is important to wean off of your opioid medication as soon as possible. If you do not need pain medication after your surgery it is ok to stop day one. Opioids include: Codeine, Hydrocodone (Norco, Vicodin), Oxycodone (Percocet, oxycontin ) and hydromorphone  amongst others.  Long term and even short term use of opiods can cause: Increased pain response Dependence Constipation Depression Respiratory depression And more.  Withdrawal symptoms can include Flu like symptoms Nausea, vomiting And more Techniques to manage these symptoms Hydrate well Eat regular healthy meals Stay active Use relaxation techniques(deep breathing, meditating, yoga) Do Not substitute Alcohol  to help with tapering If you have been on opioids for less than two weeks and do not have pain than it is ok to stop all together.  Plan to wean off of opioids This plan should start within one week post op of your joint replacement. Maintain the same interval or time between taking each dose and first decrease the dose.  Cut the total daily intake of opioids by one tablet each day Next start to increase the time  between doses. The last dose that should be eliminated is the evening dose.  MAKE SURE YOU:  Understand these instructions.  Get help right away if you are not doing well or get worse.    Thank you for letting us  be a part of your medical care team.  It is a privilege we respect greatly.  We hope these instructions will help you stay on track for a fast and full recovery!

## 2024-07-18 NOTE — Transfer of Care (Signed)
 Immediate Anesthesia Transfer of Care Note  Patient: Candice [name]  Procedure(s) Performed: ARTHROPLASTY, KNEE, TOTAL (Right: Knee)  Patient Location: PACU  Anesthesia Type:Spinal  Level of Consciousness: awake, alert , and oriented  Airway & Oxygen Therapy: Patient Spontanous Breathing and Patient connected to face mask oxygen  Post-op Assessment: Report given to RN and Post -op Vital signs reviewed and stable  Post vital signs: Reviewed and stable  Last Vitals:  Vitals Value Taken Time  BP 132/64 07/18/24 11:31  Temp 36.4 C 07/18/24 11:31  Pulse 64 07/18/24 11:37  Resp 16 07/18/24 11:37  SpO2 99 % 07/18/24 11:37  Vitals shown include unfiled device data.  Last Pain:  Vitals:   07/18/24 1131  TempSrc:   PainSc: 0-No pain      Patients Stated Pain Goal: 4 (07/18/24 0709)  Complications: No notable events documented.

## 2024-07-18 NOTE — Op Note (Signed)
 NAME: [name] [name] M. MEDICAL RECORD NO: [id number] ACCOUNT NO: [id number] DATE OF BIRTH: 19-Mar-1951 FACILITY: [facility] LOCATION: [id number] PHYSICIAN: Elspeth SAUNDERS. Kay, MD  Operative Report   DATE OF PROCEDURE: 07/18/2024  PREOPERATIVE DIAGNOSIS:  Right knee end-stage arthritis.  POSTOPERATIVE DIAGNOSIS: Right knee end-stage arthritis.  PROCEDURE PERFORMED:  Right total knee arthroplasty using DePuy Attune prosthesis.  ATTENDING SURGEON:  Elspeth SAUNDERS. Kay, MD  ASSISTANT:  Debby Crock [place], [place], who was scrubbed during the entire procedure, and necessary for satisfactory completion of surgery.  ANESTHESIA:  Spinal anesthesia plus adductor canal block was utilized.  ESTIMATED BLOOD LOSS:  Minimal.  FLUID REPLACEMENT:  1500 mL of crystalloid.  COUNTS:  Instrument counts were correct.  COMPLICATIONS:  There were no complications.  ANTIBIOTICS:  Perioperative antibiotics were given.  TOURNIQUET TIME:  80 minutes at 275 mmHg.  INDICATIONS:  The patient is a 73 year old female with worsening right knee pain due to end-stage osteoarthritis, bone-on-bone.  The patient has failed conservative management and desires operative treatment to eliminate pain and to restore function.   Informed consent obtained.  DESCRIPTION OF PROCEDURE:  After an adequate level of anesthesia was achieved, the patient was positioned supine on the operating room table.  A nonsterile tourniquet was placed on the right proximal thigh.  The right leg was sterilely prepped and draped  in the usual manner.  A timeout was called verifying the correct patient and correct site.  We elevated the leg and exsanguinated with an Esmarch bandage, and inflating the tourniquet to 275 mmHg.  We then placed the knee in flexion and performed a  longitudinal midline incision with a 10-blade scalpel.  We used a fresh 10-blade scalpel for the medial parapatellar arthrotomy.  We then divided the lateral patellofemoral ligaments,  everting the patella and exposing the distal femur, which was devoid  of cartilage.  We entered the distal femur with a step-cut drill.  We then placed our intramedullary guide and resected 11 mm off the distal femur set on 5 degrees of valgus for this patient with a flexion contracture.  We then sized the femur to size 5  anterior down, performing anterior, posterior, and chamfer cuts with a 4-in-1 block.  We then removed ACL, PCL, and meniscus tissue, subluxing the tibia anteriorly.  We placed our external jig and cut the tibia at 90 degrees perpendicular to the long  axis of the tibia with minimal posterior slope for this posterior cruciate substituting prosthesis.  We removed 3 mm off the affected medial side.  With our cut performed, we placed a lamina spreader and removed posterior femoral condyle osteophytes and  released the posterior capsule.  We injected the posterior capsule with a combination of Marcaine , Exparel  and saline.  Next, we went ahead and checked our gaps, which were symmetric at 6 mm.  We removed our tibial pins.  We completed our tibial  preparation with a modular drill and keel punch for the size 5 tibia.  With that in place, we cut our box for the size 5 right femur.  We then placed the trial femur in place and then drilled the lug holes with the lug drill.  Next, we trialed with a  size 5 6 mm poly, placed the knee in extension and had good stability.  We went ahead and then resurfaced the patella, going from a 24 mm thickness down to a 14 mm thickness and drilling lug holes for the 38 mm patellar button.  We ranged the knee  and  had excellent patellar tracking with no-touch technique.  We then removed all trial components.  We irrigated thoroughly.  We dried the bone well.  We vacuum mixed high viscosity cement and cemented the components into place all in one step, including a  size 5 tibia and 5 right femur.  We cemented with a 6-mm poly, so it was a size 5 6 mm polyethylene,  placed the knee in extension and allowed the cement to set up in compression and also used a patellar clamp to compress the patella while the cement set  up.  Once all cement was hard on the back table, we removed excess cement with a quarter-inch curved osteotome.  We injected the anterior capsule with a combination of Marcaine , Exparel  and saline.  We then selected a real size 5 8 mm poly.  We placed it  on the tibial tray, reduced the knee and we are happy with our soft tissue balancing and patella tracking.  We irrigated thoroughly.  We then repaired the parapatellar arthrotomy with #1 Vicryl suture followed by 0 and 2-0 layered subcutaneous closure  and 4-0 Monocryl for skin.  Steri-Strips were applied followed by a sterile dressing.  The patient tolerated the surgery well.   PUS D: 07/18/2024 11:34:49 am T: 07/18/2024 3:12:00 pm  JOB: [id number]/ [id number]

## 2024-07-18 NOTE — Care Plan (Signed)
 Ortho Bundle Case Management Note  Patient Details  Name: Candice [name] MRN: [id number] Date of Birth: 05-04-1951  R TKA on 07/18/24  DCP: Home with grandson and sister DME: No needs. Has RW and cane PT: [facility]                   DME Arranged:  N/A DME Agency:  NA  HH Arranged:    HH Agency:     Additional Comments: Please contact me with any questions of if this plan should need to change.  Lyle Pepper, CCM [facility] [phone]   07/18/2024, 8:23 AM

## 2024-07-18 NOTE — Interval H&P Note (Signed)
 History and Physical Interval Note:  07/18/2024 7:29 AM  Candice [name]  has presented today for surgery, with the diagnosis of Osteoarthritis of left knee joint.  The various methods of treatment have been discussed with the patient and family. After consideration of risks, benefits and other options for treatment, the patient has consented to  Procedure(s): ARTHROPLASTY, KNEE, TOTAL (Right) as a surgical intervention.  The patient's history has been reviewed, patient examined, no change in status, stable for surgery.  I have reviewed the patient's chart and labs.  Questions were answered to the patient's satisfaction.     Elspeth JONELLE Her

## 2024-07-18 NOTE — Anesthesia Procedure Notes (Signed)
 Procedure Name: MAC Date/Time: 07/18/2024 9:28 AM  Performed by: Joshua Vernell BROCKS, CRNAPre-anesthesia Checklist: Patient identified, Emergency Drugs available, Suction available and Patient being monitored Patient Re-evaluated:Patient Re-evaluated prior to induction Oxygen Delivery Method: Simple face mask Preoxygenation: Pre-oxygenation with 100% oxygen Number of attempts: 1 Placement Confirmation: positive ETCO2 and breath sounds checked- equal and bilateral Dental Injury: Teeth and Oropharynx as per pre-operative assessment

## 2024-07-18 NOTE — TOC Transition Note (Signed)
 Transition of Care [facility]) - Discharge Note   Patient Details  Name: Candice [name] MRN: [id number] Date of Birth: 08-Aug-1951  [facility] CM/SW Contact:  NORMAN ASPEN, LCSW Phone Number: 07/18/2024, 1:19 PM   Clinical Narrative:     Met with pt who confirms she has needed DME in the home.  OPPT already arranged with [facility].  No further TOC needs.  Final next level of care: OP Rehab Barriers to Discharge: No Barriers Identified   Patient Goals and CMS Choice Patient states their goals for this hospitalization and ongoing recovery are:: return home          Discharge Placement                       Discharge Plan and Services Additional resources added to the After Visit Summary for                  DME Arranged: N/A DME Agency: NA                  Social Drivers of Health (SDOH) Interventions SDOH Screenings   Food Insecurity: No Food Insecurity (07/18/2024)  Housing: Low Risk  (07/18/2024)  Transportation Needs: No Transportation Needs (07/18/2024)  Utilities: Not At Risk (07/18/2024)  Alcohol  Screen: Low Risk  (06/07/2022)  Depression (PHQ2-9): Low Risk  (06/07/2022)  Financial Resource Strain: Low Risk  (06/07/2022)  Physical Activity: Inactive (06/07/2022)  Social Connections: Moderately Integrated (07/18/2024)  Stress: No Stress Concern Present (06/07/2022)  Tobacco Use: Medium Risk (07/18/2024)     Readmission Risk Interventions     No data to display

## 2024-07-18 NOTE — Anesthesia Procedure Notes (Signed)
 Spinal  Patient location during procedure: OR Start time: 07/18/2024 9:29 AM End time: 07/18/2024 9:40 AM Reason for block: surgical anesthesia Staffing Performed: anesthesiologist  Anesthesiologist: Jefm Garnette LABOR, MD Performed by: Jefm Garnette LABOR, MD Authorized by: Jefm Garnette LABOR, MD   Preanesthetic Checklist Completed: patient identified, IV checked, risks and benefits discussed, surgical consent, monitors and equipment checked, pre-op evaluation and timeout performed Spinal Block Patient position: sitting Prep: DuraPrep and site prepped and draped Patient monitoring: heart rate, cardiac monitor, continuous pulse ox and blood pressure Approach: midline Location: L3-4 Injection technique: single-shot Needle Needle type: Pencan  Needle gauge: 24 G Needle length: 10 cm Needle insertion depth: 8 cm Assessment Sensory level: T4 Events: CSF return Additional Notes 3 Attempt (s). Pt tolerated procedure well.

## 2024-07-18 NOTE — Brief Op Note (Signed)
 07/18/2024  11:29 AM  PATIENT:  Candice [name]  73 y.o. female  PRE-OPERATIVE DIAGNOSIS:  Osteoarthritis of right knee joint  POST-OPERATIVE DIAGNOSIS:  Osteoarthritis of right knee joint  PROCEDURE:  Procedure(s): ARTHROPLASTY, KNEE, TOTAL (Right) DePuy Attune  SURGEON:  Surgeons and Role:    DEWAINE Kay Kemps, MD - Primary  PHYSICIAN ASSISTANT:   ASSISTANTS: Debby KATHEE Fireman, PA-C   ANESTHESIA:   regional and spinal  EBL:  50 mL   BLOOD ADMINISTERED:none  DRAINS: none   LOCAL MEDICATIONS USED:  MARCAINE      SPECIMEN:  No Specimen  DISPOSITION OF SPECIMEN:  N/A  COUNTS:  YES  TOURNIQUET:   Total Tourniquet Time Documented: Thigh (Right) - 80 minutes Total: Thigh (Right) - 80 minutes   DICTATION: .Other Dictation: Dictation Number [id number]  PLAN OF CARE: Admit for overnight observation  PATIENT DISPOSITION:  PACU - hemodynamically stable.   Delay start of Pharmacological VTE agent (>24hrs) due to surgical blood loss or risk of bleeding: no

## 2024-07-19 DIAGNOSIS — I10 Essential (primary) hypertension: Secondary | ICD-10-CM | POA: Diagnosis not present

## 2024-07-19 DIAGNOSIS — M1711 Unilateral primary osteoarthritis, right knee: Secondary | ICD-10-CM | POA: Diagnosis not present

## 2024-07-19 DIAGNOSIS — J45909 Unspecified asthma, uncomplicated: Secondary | ICD-10-CM | POA: Diagnosis not present

## 2024-07-19 DIAGNOSIS — Z87891 Personal history of nicotine dependence: Secondary | ICD-10-CM | POA: Diagnosis not present

## 2024-07-19 DIAGNOSIS — K219 Gastro-esophageal reflux disease without esophagitis: Secondary | ICD-10-CM | POA: Diagnosis not present

## 2024-07-19 DIAGNOSIS — F1090 Alcohol use, unspecified, uncomplicated: Secondary | ICD-10-CM | POA: Diagnosis not present

## 2024-07-19 NOTE — Care Management Obs Status (Signed)
 MEDICARE OBSERVATION STATUS NOTIFICATION   Patient Details  Name: Candice [name] MRN: [id number] Date of Birth: [birth year]   Medicare Observation Status Notification Given:  Yes    Zhanna Melin A Jamel Holzmann, LCSW 07/19/2024, 10:03 AM

## 2024-07-19 NOTE — Progress Notes (Signed)
 PIV got removed on night shift by accident. Pt is eating/drinking well and voiding as well. PA ok with no iv access.

## 2024-07-19 NOTE — Progress Notes (Signed)
 Subjective: 1 Day Post-Op Procedure(s) (LRB): ARTHROPLASTY, KNEE, TOTAL (Right)  Patient reports pain as mild to moderate.  Denies fever, chills, N/V, CP, SOB.  Tolerating POs well.  Admits to flatus.  Notes that she would like to go home.  Objective:   VITALS:  Temp:  [97.5 F (36.4 C)-97.7 F (36.5 C)] 97.6 F (36.4 C) (09/06 0601) Pulse Rate:  [50-68] 57 (09/06 0824) Resp:  [11-18] 18 (09/06 0601) BP: (104-196)/(59-85) 156/72 (09/06 0824) SpO2:  [93 %-100 %] 93 % (09/06 0848) FiO2 (%):  [21 %] 21 % (09/06 0848)  General: WDWN patient in NAD. Psych:  Appropriate mood and affect. Neuro:  A&O x 3, Moving all extremities, sensation intact to light touch HEENT:  EOMs intact Chest:  Even non-labored respirations Skin:  DressingC/D/I, no rashes or lesions Extremities: warm/dry, mild edema to R knee, no erythema or echymosis.  No lymphadenopathy. Pulses: Dorsalis pedis 2+ MSK:  ROM: lacks 5 degrees TKE, MMT: able to perform quad set, (-) Homan's    LABS No results for input(s): HGB, WBC, PLT in the last 72 hours. No results for input(s): NA, K, CL, CO2, BUN, CREATININE, GLUCOSE in the last 72 hours. No results for input(s): LABPT, INR in the last 72 hours.   Assessment/Plan: 1 Day Post-Op Procedure(s) (LRB): ARTHROPLASTY, KNEE, TOTAL (Right)  Patient seen in rounds for Dr. Kay. WBAT R LE Up with therapy D/C home today upon clearance from PT.  D/C order placed.  Eva Barrack PA-C [facility] Office:  [phone]

## 2024-07-19 NOTE — Discharge Summary (Signed)
 Physician Discharge Summary  Patient ID: MOMOKA STRINGFIELD MRN: [id number] DOB/AGE: 02/20/1951 73 y.o.  Admit date: 07/18/2024 Discharge date: 07/19/2024  Admission Diagnoses: Right knee OA; hx of IDA, guaiac positive stool, rectocele, vaginal enterocele, normochromic normocytic anemia, cerebral aneurysm, carpal tunnel, snoring, OSA, asthma, and hiatal hernia  Discharge Diagnoses:  Principal Problem:   Status post total knee replacement, right As stated above  Discharged Condition: stable  Hospital Course: Patient presented to [facility] OR on 07/18/24 for elective R total knee replacement.  She tolerated the procedure well without complication.  She was admitted to the hospital and worked well with therapy.  She tolerated her stay well without incident.  She is to be D/C'd home.  Consults: PT  Significant Diagnostic Studies: N/A  Treatments: IV hydration, antibiotics: Ancef , analgesia: acetaminophen , Dilaudid , oxycodone , and tramadol , cardiac meds: carvedilol  and spironolactone , anticoagulation: ASA, and surgery: as stated above.  Discharge Exam: Blood pressure (!) 156/72, pulse (!) 57, temperature 97.6 F (36.4 C), temperature source Oral, resp. rate 18, height 5' 6 (1.676 m), weight 81.6 kg, SpO2 93%.  General: WDWN patient in NAD. Psych:  Appropriate mood and affect. Neuro:  A&O x 3, Moving all extremities, sensation intact to light touch HEENT:  EOMs intact Chest:  Even non-labored respirations Skin:  DressingC/D/I, no rashes or lesions Extremities: warm/dry, mild edema to R knee, no erythema or echymosis.  No lymphadenopathy. Pulses: Dorsalis pedis 2+ MSK:  ROM: lacks 5 degrees TKE, MMT: able to perform quad set, (-) Homan's  Disposition: Discharge disposition: 01-Home or Self Care       Discharge Instructions     Call MD / Call 911   Complete by: As directed    If you experience chest pain or shortness of breath, CALL 911 and be transported to the hospital emergency room.   If you develope a fever above 101 F, pus (white drainage) or increased drainage or redness at the wound, or calf pain, call your surgeon's office.   Constipation Prevention   Complete by: As directed    Drink plenty of fluids.  Prune juice may be helpful.  You may use a stool softener, such as Colace (over the counter) 100 mg twice a day.  Use MiraLax  (over the counter) for constipation as needed.   Diet - low sodium heart healthy   Complete by: As directed    Increase activity slowly as tolerated   Complete by: As directed    Post-operative opioid taper instructions:   Complete by: As directed    POST-OPERATIVE OPIOID TAPER INSTRUCTIONS: It is important to wean off of your opioid medication as soon as possible. If you do not need pain medication after your surgery it is ok to stop day one. Opioids include: Codeine, Hydrocodone (Norco, Vicodin), Oxycodone (Percocet, oxycontin ) and hydromorphone  amongst others.  Long term and even short term use of opiods can cause: Increased pain response Dependence Constipation Depression Respiratory depression And more.  Withdrawal symptoms can include Flu like symptoms Nausea, vomiting And more Techniques to manage these symptoms Hydrate well Eat regular healthy meals Stay active Use relaxation techniques(deep breathing, meditating, yoga) Do Not substitute Alcohol  to help with tapering If you have been on opioids for less than two weeks and do not have pain than it is ok to stop all together.  Plan to wean off of opioids This plan should start within one week post op of your joint replacement. Maintain the same interval or time between taking each dose and first decrease the  dose.  Cut the total daily intake of opioids by one tablet each day Next start to increase the time between doses. The last dose that should be eliminated is the evening dose.      Weight bearing as tolerated   Complete by: As directed    Laterality: right    Extremity: Lower      Allergies as of 07/19/2024       Reactions   Codeine Nausea Only   Sulfa Antibiotics Rash        Medication List     STOP taking these medications    pantoprazole  40 MG tablet Commonly known as: PROTONIX        TAKE these medications    acetaminophen  650 MG CR tablet Commonly known as: TYLENOL  Take 1,300 mg by mouth in the morning and at bedtime.   acyclovir  400 MG tablet Commonly known as: ZOVIRAX  Take 400 mg by mouth daily.   albuterol  108 (90 Base) MCG/ACT inhaler Commonly known as: VENTOLIN  HFA Inhale 1-2 puffs into the lungs every 6 (six) hours as needed.   aspirin  81 MG chewable tablet Commonly known as: Aspirin  Childrens Chew 1 tablet (81 mg total) by mouth 2 (two) times daily.   budesonide  0.25 MG/2ML nebulizer solution Commonly known as: PULMICORT  Take 0.25 mg by nebulization 2 (two) times daily.   carvedilol  12.5 MG tablet Commonly known as: COREG  Take 12.5 mg by mouth 2 (two) times daily with a meal.   cholecalciferol  25 MCG (1000 UNIT) tablet Commonly known as: VITAMIN D3 Take 1,000 Units by mouth daily.   citalopram  20 MG tablet Commonly known as: CELEXA  Take 20 mg by mouth daily. In the morning   clonazePAM  0.5 MG tablet Commonly known as: KLONOPIN  Take 0.25 mg by mouth at bedtime as needed for anxiety.   COLLAGEN PO Take 1 Scoop by mouth daily.   cyanocobalamin  500 MCG tablet Commonly known as: VITAMIN B12 Take 500 mcg by mouth daily.   donepezil  5 MG tablet Commonly known as: ARICEPT  Take 5 mg by mouth at bedtime.   Dulaglutide  4.5 MG/0.5ML Soaj Inject 4.5 mg into the skin once a week.   ezetimibe  10 MG tablet Commonly known as: ZETIA  Take 10 mg by mouth daily.   famotidine  40 MG tablet Commonly known as: PEPCID  Take 40 mg by mouth daily.   fluticasone  50 MCG/ACT nasal spray Commonly known as: FLONASE  Place 1 spray into both nostrils at bedtime. What changed:  when to take this reasons to take  this   gabapentin  300 MG capsule Commonly known as: NEURONTIN  Take 300 mg by mouth at bedtime.   ipratropium-albuterol  0.5-2.5 (3) MG/3ML Soln Commonly known as: DUONEB Take 3 mLs by nebulization every 6 (six) hours as needed.   linaclotide  145 MCG Caps capsule Commonly known as: LINZESS  Take 145 mcg by mouth daily as needed (for constipation).   loratadine  10 MG tablet Commonly known as: CLARITIN  Take 10 mg by mouth daily.   MAGNESIUM  CITRATE PO Take 1 capsule by mouth daily.   meloxicam 15 MG tablet Commonly known as: MOBIC Take 15 mg by mouth daily as needed for pain.   methocarbamol  500 MG tablet Commonly known as: ROBAXIN  Take 1 tablet (500 mg total) by mouth every 8 (eight) hours as needed for muscle spasms.   MUSCLE CRAMP COMPLEX PO Take 1 capsule by mouth daily.   ondansetron  4 MG tablet Commonly known as: Zofran  Take 1 tablet (4 mg total) by mouth every 8 (eight) hours  as needed for nausea, vomiting or refractory nausea / vomiting.   oxyCODONE  5 MG immediate release tablet Commonly known as: Roxicodone  Take 1 tablet (5 mg total) by mouth every 8 (eight) hours as needed.   spironolactone  25 MG tablet Commonly known as: ALDACTONE  Take 12.5 mg by mouth daily.   traMADol  50 MG tablet Commonly known as: ULTRAM  Take 50 mg by mouth every 6 (six) hours as needed for severe pain (pain score 7-10).   Trelegy Ellipta  200-62.5-25 MCG/ACT Aepb Generic drug: Fluticasone -Umeclidin-Vilant INHALE 1 PUFF INTO THE LUNGS DAILY               Discharge Care Instructions  (From admission, onward)           Start     Ordered   07/19/24 0000  Weight bearing as tolerated       Question Answer Comment  Laterality right   Extremity Lower      07/19/24 0921            Follow-up Information     Kay Kemps, MD. Go on 07/30/2024.   Specialty: Orthopedic Surgery Why: You are scheduled for a post op appointment on Wednesday 07/30/24 at 11:00am Contact  information: [address] [place] [address] [phone]         Kay Kemps, MD. Call in 2 week(s).   Specialty: Orthopedic Surgery Why: call [phone] for appt in two weeks in the office Contact information: [address] [place] [address] [place] [address] [phone]                 Signed: Eva Aniceto DEVONNA Dareen Office:  [phone]

## 2024-07-19 NOTE — Evaluation (Signed)
 Physical Therapy Evaluation Patient Details Name: Candice [name] MRN: [id number] DOB: 1951-01-09 Today's Date: 07/19/2024  History of Present Illness  Pt s/p R TKR and with hx of CKD and peripheral neuopathy  Clinical Impression  Pt s/p R TKR and presents with decreased R LE strength/ROM and post op pain limiting functional mobility.  Pt should progress to dc home with family assist.        If plan is discharge home, recommend the following: A little help with walking and/or transfers;A little help with bathing/dressing/bathroom;Assistance with cooking/housework;Assist for transportation;Help with stairs or ramp for entrance   Can travel by private vehicle        Equipment Recommendations Rolling walker (2 wheels)  Recommendations for Other Services       Functional Status Assessment Patient has had a recent decline in their functional status and demonstrates the ability to make significant improvements in function in a reasonable and predictable amount of time.     Precautions / Restrictions Precautions Precautions: Knee;Fall Required Braces or Orthoses: Knee Immobilizer - Right Knee Immobilizer - Right: Discontinue once straight leg raise with < 10 degree lag (pt performed IND SLR this am) Restrictions Weight Bearing Restrictions Per Provider Order: Yes RLE Weight Bearing Per Provider Order: Weight bearing as tolerated      Mobility  Bed Mobility Overal bed mobility: Needs Assistance Bed Mobility: Supine to Sit     Supine to sit: Contact guard     General bed mobility comments: for safety    Transfers   Equipment used: Rolling walker (2 wheels)               General transfer comment: cues for LE management and use of UEs to self assist    Ambulation/Gait Ambulation/Gait assistance: Min assist Gait Distance (Feet): 42 Feet (and 15' to bathroom) Assistive device: Rolling walker (2 wheels) Gait Pattern/deviations: Step-to pattern, Decreased step length  - right, Decreased step length - left, Shuffle, Trunk flexed Gait velocity: decr     General Gait Details: cues for sequence, posture and position from [employer]            Wheelchair Mobility     Tilt Bed    Modified Rankin (Stroke Patients Only)       Balance Overall balance assessment: Needs assistance Sitting-balance support: No upper extremity supported, Feet supported Sitting balance-Leahy Scale: Good     Standing balance support: Single extremity supported Standing balance-Leahy Scale: Poor                               Pertinent Vitals/Pain Pain Assessment Pain Assessment: 0-10 Pain Score: 6  Pain Location: R knee Pain Descriptors / Indicators: Aching, Sore Pain Intervention(s): Limited activity within patient's tolerance, Monitored during session, Premedicated before session, Ice applied    Home Living Family/patient expects to be discharged to:: Private residence Living Arrangements: Other relatives Available Help at Discharge: Family;Available 24 hours/day Type of Home: House Home Access: Stairs to enter Entrance Stairs-Rails: Doctor, general practice of Steps: 3   Home Layout: One level Home Equipment: Rollator (4 wheels)      Prior Function Prior Level of Function : Independent/Modified Independent                     Extremity/Trunk Assessment   Upper Extremity Assessment Upper Extremity Assessment: Overall WFL for tasks assessed    Lower Extremity Assessment Lower Extremity Assessment:  RLE deficits/detail RLE Deficits / Details: IND SLR with AAROM at knee -5 - 85    Cervical / Trunk Assessment Cervical / Trunk Assessment: Normal  Communication   Communication Communication: No apparent difficulties    Cognition Arousal: Alert Behavior During Therapy: WFL for tasks assessed/performed   PT - Cognitive impairments: No apparent impairments                         Following commands:  Intact       Cueing Cueing Techniques: Verbal cues     General Comments      Exercises Total Joint Exercises Ankle Circles/Pumps: AROM, Both, 15 reps, Supine Quad Sets: AROM, Both, 10 reps, Supine Heel Slides: AAROM, Right, 15 reps, Supine Straight Leg Raises: AAROM, AROM, Right, 15 reps, Supine   Assessment/Plan    PT Assessment Patient needs continued PT services  PT Problem List Decreased strength;Decreased range of motion;Decreased activity tolerance;Decreased balance;Decreased mobility;Decreased knowledge of use of DME;Pain       PT Treatment Interventions DME instruction;Gait training;Stair training;Functional mobility training;Therapeutic activities;Therapeutic exercise;Patient/family education    PT Goals (Current goals can be found in the Care Plan section)  Acute Rehab PT Goals Patient Stated Goal: Regain IND PT Goal Formulation: With patient Time For Goal Achievement: 07/26/24 Potential to Achieve Goals: Good    Frequency 7X/week     Co-evaluation               AM-PAC PT 6 Clicks Mobility  Outcome Measure Help needed turning from your back to your side while in a flat bed without using bedrails?: A Little Help needed moving from lying on your back to sitting on the side of a flat bed without using bedrails?: A Little Help needed moving to and from a bed to a chair (including a wheelchair)?: A Little Help needed standing up from a chair using your arms (e.g., wheelchair or bedside chair)?: A Little Help needed to walk in hospital room?: A Little Help needed climbing 3-5 steps with a railing? : A Lot 6 Click Score: 17    End of Session Equipment Utilized During Treatment: Gait belt Activity Tolerance: Patient tolerated treatment well Patient left: in chair;with call bell/phone within reach;with chair alarm set Nurse Communication: Mobility status PT Visit Diagnosis: Difficulty in walking, not elsewhere classified (R26.2)    Time: 9084-9047 PT  Time Calculation (min) (ACUTE ONLY): 37 min   Charges:   PT Evaluation $PT Eval Low Complexity: 1 Low PT Treatments $Therapeutic Exercise: 8-22 mins PT General Charges $$ ACUTE PT VISIT: 1 Visit         [facility] PT Acute Rehabilitation Services Office [phone]   [facility] 07/19/2024, 10:44 AM

## 2024-07-19 NOTE — Progress Notes (Signed)
 Physical Therapy Treatment Patient Details Name: Candice [name] MRN: [id number] DOB: 05-05-1951 Today's Date: 07/19/2024   History of Present Illness Pt s/p R TKR and with hx of CKD and peripheral neuopathy    PT Comments  Pt motivated and progressing well with mobility.  Pt up to ambulate increased distance in hall, negotiated stairs and reviewed written HEP.  Pt eager for dc home this date.     If plan is discharge home, recommend the following: A little help with walking and/or transfers;A little help with bathing/dressing/bathroom;Assistance with cooking/housework;Assist for transportation;Help with stairs or ramp for entrance   Can travel by private vehicle        Equipment Recommendations  Rolling walker (2 wheels)    Recommendations for Other Services       Precautions / Restrictions Precautions Precautions: Knee;Fall Required Braces or Orthoses: Knee Immobilizer - Right Knee Immobilizer - Right: Discontinue once straight leg raise with < 10 degree lag Restrictions Weight Bearing Restrictions Per Provider Order: Yes RLE Weight Bearing Per Provider Order: Weight bearing as tolerated     Mobility  Bed Mobility Overal bed mobility: Needs Assistance Bed Mobility: Supine to Sit     Supine to sit: Contact guard     General bed mobility comments: Pt up in chair and requests back to same    Transfers Overall transfer level: Needs assistance Equipment used: Rolling walker (2 wheels) Transfers: Sit to/from Stand Sit to Stand: Contact guard assist, Supervision           General transfer comment: cues for LE management and use of UEs to self assist    Ambulation/Gait Ambulation/Gait assistance: Min assist Gait Distance (Feet): 80 Feet Assistive device: Rolling walker (2 wheels) Gait Pattern/deviations: Step-to pattern, Decreased step length - right, Decreased step length - left, Shuffle, Trunk flexed Gait velocity: decr     General Gait Details: cues for  sequence, posture and position from RW   Stairs Stairs: Yes Stairs assistance: Contact guard assist Stair Management: Two rails, Step to pattern, Forwards Number of Stairs: 3 General stair comments: cues for sequence   Wheelchair Mobility     Tilt Bed    Modified Rankin (Stroke Patients Only)       Balance Overall balance assessment: Needs assistance Sitting-balance support: No upper extremity supported, Feet supported Sitting balance-Leahy Scale: Good     Standing balance support: Single extremity supported Standing balance-Leahy Scale: Poor                              Communication Communication Communication: No apparent difficulties  Cognition Arousal: Alert Behavior During Therapy: WFL for tasks assessed/performed   PT - Cognitive impairments: No apparent impairments                         Following commands: Intact      Cueing Cueing Techniques: Verbal cues  Exercises Total Joint Exercises Ankle Circles/Pumps: AROM, Both, 15 reps, Supine Quad Sets: AROM, Both, 10 reps, Supine Heel Slides: AAROM, Right, 15 reps, Supine Straight Leg Raises: AAROM, AROM, Right, 15 reps, Supine    General Comments        Pertinent Vitals/Pain Pain Assessment Pain Assessment: 0-10 Pain Score: 6  Pain Location: R knee Pain Descriptors / Indicators: Aching, Sore Pain Intervention(s): Limited activity within patient's tolerance, Monitored during session, Premedicated before session, Ice applied    Home Living  Prior Function            PT Goals (current goals can now be found in the care plan section) Acute Rehab PT Goals Patient Stated Goal: Regain IND PT Goal Formulation: With patient Time For Goal Achievement: 07/26/24 Potential to Achieve Goals: Good Progress towards PT goals: Progressing toward goals    Frequency    7X/week      PT Plan      Co-evaluation              AM-PAC PT  6 Clicks Mobility   Outcome Measure  Help needed turning from your back to your side while in a flat bed without using bedrails?: A Little Help needed moving from lying on your back to sitting on the side of a flat bed without using bedrails?: A Little Help needed moving to and from a bed to a chair (including a wheelchair)?: A Little Help needed standing up from a chair using your arms (e.g., wheelchair or bedside chair)?: A Little Help needed to walk in hospital room?: A Little Help needed climbing 3-5 steps with a railing? : A Little 6 Click Score: 18    End of Session Equipment Utilized During Treatment: Gait belt Activity Tolerance: Patient tolerated treatment well Patient left: in chair;with call bell/phone within reach;with chair alarm set Nurse Communication: Mobility status PT Visit Diagnosis: Difficulty in walking, not elsewhere classified (R26.2)     Time: 8686-8654 PT Time Calculation (min) (ACUTE ONLY): 32 min  Charges:    $Gait Training: 8-22 mins $Therapeutic Activity: 8-22 mins PT General Charges $$ ACUTE PT VISIT: 1 Visit                     [facility] PT Acute Rehabilitation Services Office [phone]    Candice [name] 07/19/2024, 3:10 PM

## 2024-07-19 NOTE — TOC Transition Note (Signed)
 Transition of Care [facility]) - Discharge Note   Patient Details  Name: Candice [name] MRN: [id number] Date of Birth: 1951/02/13  Transition of Care [facility]) CM/SW Contact:  Heather DELENA Saltness, LCSW Phone Number: 07/19/2024, 11:14 AM   Clinical Narrative:    Pt recommended for RW upon discharge. Pt denies DME agency preference. RW ordered through [employer]. [name] reports RW will be delivered to bedside.   Final next level of care: OP Rehab Barriers to Discharge: Barriers Resolved   Patient Goals and CMS Choice Patient states their goals for this hospitalization and ongoing recovery are:: return home          Discharge Placement  Home                     Discharge Plan and Services Additional resources added to the After Visit Summary for  Follow Up                DME Arranged: Walker rolling DME Agency: [employer] Date DME Agency Contacted: 07/19/24 Time DME Agency Contacted: 1114 Representative spoke with at DME Agency: [place]            Social Drivers of Health (SDOH) Interventions SDOH Screenings   Food Insecurity: No Food Insecurity (07/18/2024)  Housing: Low Risk  (07/18/2024)  Transportation Needs: No Transportation Needs (07/18/2024)  Utilities: Not At Risk (07/18/2024)  Alcohol  Screen: Low Risk  (06/07/2022)  Depression (PHQ2-9): Low Risk  (06/07/2022)  Financial Resource Strain: Low Risk  (06/07/2022)  Physical Activity: Inactive (06/07/2022)  Social Connections: Moderately Integrated (07/18/2024)  Stress: No Stress Concern Present (06/07/2022)  Tobacco Use: Medium Risk (07/18/2024)     Readmission Risk Interventions     No data to display          Signed: Heather Saltness, MSW, LCSW Clinical Social Worker Inpatient Care Management 07/19/2024 11:16 AM

## 2024-07-21 ENCOUNTER — Encounter (HOSPITAL_COMMUNITY): Payer: Self-pay | Admitting: Orthopedic Surgery

## 2024-07-21 NOTE — Anesthesia Postprocedure Evaluation (Signed)
 Anesthesia Post Note  Patient: Candice [name]  Procedure(s) Performed: ARTHROPLASTY, KNEE, TOTAL (Right: Knee)     Patient location during evaluation: Nursing Unit Anesthesia Type: Regional and Spinal Level of consciousness: oriented and awake and alert Pain management: pain level controlled Vital Signs Assessment: post-procedure vital signs reviewed and stable Respiratory status: spontaneous breathing and respiratory function stable Cardiovascular status: blood pressure returned to baseline and stable Postop Assessment: no headache, no backache, no apparent nausea or vomiting and patient able to bend at knees Anesthetic complications: no   No notable events documented.  Last Vitals:  Vitals:   07/19/24 0848 07/19/24 1009  BP:  (!) 142/59  Pulse:  (!) 57  Resp:  18  Temp:  36.6 C  SpO2: 93% 95%    Last Pain:  Vitals:   07/19/24 1351  TempSrc:   PainSc: 7                  Garnette DELENA Gab

## 2024-07-22 DIAGNOSIS — Z471 Aftercare following joint replacement surgery: Secondary | ICD-10-CM | POA: Diagnosis not present

## 2024-07-22 DIAGNOSIS — M6281 Muscle weakness (generalized): Secondary | ICD-10-CM | POA: Diagnosis not present

## 2024-07-22 DIAGNOSIS — M25561 Pain in right knee: Secondary | ICD-10-CM | POA: Diagnosis not present

## 2024-07-24 DIAGNOSIS — M6281 Muscle weakness (generalized): Secondary | ICD-10-CM | POA: Diagnosis not present

## 2024-07-24 DIAGNOSIS — Z471 Aftercare following joint replacement surgery: Secondary | ICD-10-CM | POA: Diagnosis not present

## 2024-07-24 DIAGNOSIS — M25561 Pain in right knee: Secondary | ICD-10-CM | POA: Diagnosis not present

## 2024-07-29 ENCOUNTER — Inpatient Hospital Stay

## 2024-07-29 DIAGNOSIS — M25561 Pain in right knee: Secondary | ICD-10-CM | POA: Diagnosis not present

## 2024-07-29 DIAGNOSIS — M6281 Muscle weakness (generalized): Secondary | ICD-10-CM | POA: Diagnosis not present

## 2024-07-29 DIAGNOSIS — Z471 Aftercare following joint replacement surgery: Secondary | ICD-10-CM | POA: Diagnosis not present

## 2024-07-30 DIAGNOSIS — Z4789 Encounter for other orthopedic aftercare: Secondary | ICD-10-CM | POA: Diagnosis not present

## 2024-07-31 DIAGNOSIS — Z471 Aftercare following joint replacement surgery: Secondary | ICD-10-CM | POA: Diagnosis not present

## 2024-07-31 DIAGNOSIS — M6281 Muscle weakness (generalized): Secondary | ICD-10-CM | POA: Diagnosis not present

## 2024-07-31 DIAGNOSIS — M25561 Pain in right knee: Secondary | ICD-10-CM | POA: Diagnosis not present

## 2024-08-05 ENCOUNTER — Inpatient Hospital Stay: Admitting: Physician Assistant

## 2024-08-05 DIAGNOSIS — M25561 Pain in right knee: Secondary | ICD-10-CM | POA: Diagnosis not present

## 2024-08-05 DIAGNOSIS — M6281 Muscle weakness (generalized): Secondary | ICD-10-CM | POA: Diagnosis not present

## 2024-08-05 DIAGNOSIS — Z471 Aftercare following joint replacement surgery: Secondary | ICD-10-CM | POA: Diagnosis not present

## 2024-08-07 DIAGNOSIS — M6281 Muscle weakness (generalized): Secondary | ICD-10-CM | POA: Diagnosis not present

## 2024-08-07 DIAGNOSIS — Z471 Aftercare following joint replacement surgery: Secondary | ICD-10-CM | POA: Diagnosis not present

## 2024-08-07 DIAGNOSIS — M25561 Pain in right knee: Secondary | ICD-10-CM | POA: Diagnosis not present

## 2024-08-12 DIAGNOSIS — Z471 Aftercare following joint replacement surgery: Secondary | ICD-10-CM | POA: Diagnosis not present

## 2024-08-12 DIAGNOSIS — M25561 Pain in right knee: Secondary | ICD-10-CM | POA: Diagnosis not present

## 2024-08-12 DIAGNOSIS — M6281 Muscle weakness (generalized): Secondary | ICD-10-CM | POA: Diagnosis not present

## 2024-08-14 DIAGNOSIS — Z471 Aftercare following joint replacement surgery: Secondary | ICD-10-CM | POA: Diagnosis not present

## 2024-08-14 DIAGNOSIS — M25561 Pain in right knee: Secondary | ICD-10-CM | POA: Diagnosis not present

## 2024-08-14 DIAGNOSIS — M6281 Muscle weakness (generalized): Secondary | ICD-10-CM | POA: Diagnosis not present

## 2024-08-18 DIAGNOSIS — M6281 Muscle weakness (generalized): Secondary | ICD-10-CM | POA: Diagnosis not present

## 2024-08-18 DIAGNOSIS — Z471 Aftercare following joint replacement surgery: Secondary | ICD-10-CM | POA: Diagnosis not present

## 2024-08-18 DIAGNOSIS — M25561 Pain in right knee: Secondary | ICD-10-CM | POA: Diagnosis not present

## 2024-08-21 DIAGNOSIS — M25561 Pain in right knee: Secondary | ICD-10-CM | POA: Diagnosis not present

## 2024-08-21 DIAGNOSIS — M6281 Muscle weakness (generalized): Secondary | ICD-10-CM | POA: Diagnosis not present

## 2024-08-21 DIAGNOSIS — Z471 Aftercare following joint replacement surgery: Secondary | ICD-10-CM | POA: Diagnosis not present

## 2024-08-25 DIAGNOSIS — M25561 Pain in right knee: Secondary | ICD-10-CM | POA: Diagnosis not present

## 2024-08-25 DIAGNOSIS — Z471 Aftercare following joint replacement surgery: Secondary | ICD-10-CM | POA: Diagnosis not present

## 2024-08-25 DIAGNOSIS — M6281 Muscle weakness (generalized): Secondary | ICD-10-CM | POA: Diagnosis not present

## 2024-08-27 DIAGNOSIS — M25561 Pain in right knee: Secondary | ICD-10-CM | POA: Diagnosis not present

## 2024-08-27 DIAGNOSIS — M6281 Muscle weakness (generalized): Secondary | ICD-10-CM | POA: Diagnosis not present

## 2024-08-27 DIAGNOSIS — Z471 Aftercare following joint replacement surgery: Secondary | ICD-10-CM | POA: Diagnosis not present

## 2024-09-01 DIAGNOSIS — M25561 Pain in right knee: Secondary | ICD-10-CM | POA: Diagnosis not present

## 2024-09-01 DIAGNOSIS — Z471 Aftercare following joint replacement surgery: Secondary | ICD-10-CM | POA: Diagnosis not present

## 2024-09-01 DIAGNOSIS — M6281 Muscle weakness (generalized): Secondary | ICD-10-CM | POA: Diagnosis not present

## 2024-09-03 ENCOUNTER — Telehealth: Payer: Self-pay

## 2024-09-03 DIAGNOSIS — M25561 Pain in right knee: Secondary | ICD-10-CM | POA: Diagnosis not present

## 2024-09-03 DIAGNOSIS — M6281 Muscle weakness (generalized): Secondary | ICD-10-CM | POA: Diagnosis not present

## 2024-09-03 DIAGNOSIS — Z471 Aftercare following joint replacement surgery: Secondary | ICD-10-CM | POA: Diagnosis not present

## 2024-09-03 NOTE — Telephone Encounter (Signed)
 CMN received from [facility] regarding pt's nebulizer mask. This has been signed by Madelin Stank, NP and ready for fax. NFN

## 2024-10-07 DIAGNOSIS — Z23 Encounter for immunization: Secondary | ICD-10-CM | POA: Diagnosis not present

## 2024-10-17 ENCOUNTER — Other Ambulatory Visit: Payer: Self-pay | Admitting: Pulmonary Disease

## 2024-10-21 DIAGNOSIS — M1712 Unilateral primary osteoarthritis, left knee: Secondary | ICD-10-CM | POA: Diagnosis not present

## 2024-10-21 DIAGNOSIS — Z96651 Presence of right artificial knee joint: Secondary | ICD-10-CM | POA: Diagnosis not present

## 2024-10-24 ENCOUNTER — Telehealth: Payer: Self-pay | Admitting: Pulmonary Disease

## 2024-10-24 NOTE — Telephone Encounter (Signed)
 Fax received from Dr. Elspeth Her with [facility] to perform a left total kne arthoplasty with choice anesthesia on patient.  Patient needs surgery clearance. Surgery is pending. Patient was seen on 3.26.25. Office protocol is a risk assessment can be sent to surgeon if patient has been seen in 60 days or less.   Pt needs ov for risk assessment. I called her and there was no answer- LMTCB and will route to clearance pool for now.

## 2024-11-10 ENCOUNTER — Ambulatory Visit: Payer: Self-pay | Admitting: Pulmonary Disease

## 2024-11-10 DIAGNOSIS — J4531 Mild persistent asthma with (acute) exacerbation: Secondary | ICD-10-CM

## 2024-11-10 NOTE — Telephone Encounter (Signed)
 Called pt and there was no answer, mailbox was full so unable to leave msg. Will send her a mychart msg letting her know she needs appt for risk assessment. Will hold in clearance pool until appt set.

## 2024-11-10 NOTE — Telephone Encounter (Signed)
 Answer Assessment - Initial Assessment Questions E2C2 Pulmonary Triage - Initial Assessment Questions Chief Complaint (e.g., cough, sob, wheezing, fever, chills, sweat or additional symptoms) *Go to specific symptom protocol after initial questions. Nebulizer machine broke last night.  How long have symptoms been present? Last night.  Have you tested for COVID or Flu? Note: If not, ask patient if a home test can be taken. If so, instruct patient to call back for positive results. No  MEDICINES:   Have you used any OTC meds to help with symptoms? No If yes, ask What medications? No.  Have you used your inhalers/maintenance medication? Yes If yes, What medications? Trelegy inhaler and albuterol  inhaler.  If inhaler, ask How many puffs and how often? Note: Review instructions on medication in the chart. She states she has not been able to use her nebulizer treatments at all today. Trelegy is once daily; albuterol  inhaler she started using last night and has used 3 treatments since last night.  OXYGEN: Do you wear supplemental oxygen? No If yes, How many liters are you supposed to use? N/A  Do you monitor your oxygen levels? No If yes, What is your reading (oxygen level) today? N/A.  What is your usual oxygen saturation reading?  (Note: Pulmonary O2 sats should be 90% or greater) N/A.        Copied from CRM [id number]. Topic: Clinical - Red Word Triage >> Nov 10, 2024 12:48 PM Corean SAUNDERS wrote: Red Word that prompted transfer to Nurse Triage: Broken nebulizer machine requesting emergency replacement.   Patient states she is already having sob without it. Reason for Disposition  [1] Follow-up call from patient regarding patient's clinical status AND [2] information urgent  Answer Assessment - Initial Assessment Questions E2C2 Pulmonary Triage - Initial Assessment Questions Chief Complaint (e.g., cough, sob, wheezing, fever, chills, sweat or  additional symptoms) *Go to specific symptom protocol after initial questions. Nebulizer machine broke last night.  How long have symptoms been present? Last night.  Have you tested for COVID or Flu? Note: If not, ask patient if a home test can be taken. If so, instruct patient to call back for positive results. No  MEDICINES:   Have you used any OTC meds to help with symptoms? No If yes, ask What medications? No.  Have you used your inhalers/maintenance medication? Yes If yes, What medications? Trelegy inhaler and albuterol  inhaler.  If inhaler, ask How many puffs and how often? Note: Review instructions on medication in the chart. She states she has not been able to use her nebulizer treatments at all today. Trelegy is once daily; albuterol  inhaler she started using last night and has used 3 treatments since last night.  OXYGEN: Do you wear supplemental oxygen? No If yes, How many liters are you supposed to use? N/A  Do you monitor your oxygen levels? No If yes, What is your reading (oxygen level) today? N/A.  What is your usual oxygen saturation reading?  (Note: Pulmonary O2 sats should be 90% or greater) N/A.       1. REASON FOR CALL or QUESTION: What is your reason for calling today? or How can I best     Patient states her nebulizer machine broke yesterday evening, she has not been able to take a treatment since yesterday at 3pm. She keeps the machine on a TV tray at her bedside and she states the tray folded up and the machine fell on the floor and the knob her hose attaches to broke  off and can't be fixed. She states she called [employer] (she is not sure if that is her company to use) to see if they could get her a new machine or have someone fix it and they told her she needs a doctor's order/referral. Patient speaking in full sentences but states she needs this emergently taken care of because she will become SOB and will lose her voice if she  goes without her nebulizer treatments.  2. CALLER: Document the source of call. (e.g., laboratory staff, caregiver or patient).     Patient.  Protocols used: PCP Call - No Triage-A-[facility]

## 2024-11-11 NOTE — Telephone Encounter (Signed)
 Patient calling back in today requesting update on her nebulizer equipment. No update in chart. PAS attempted to call [facility] twice and no answer. Call transferred to [facility].

## 2024-11-11 NOTE — Telephone Encounter (Signed)
 Per secure chat Dr. Neda advised okay to place new order.  New order and pt is aware.   [facility] please send ASAP TO [facility]

## 2024-11-18 ENCOUNTER — Telehealth: Payer: Self-pay | Admitting: Pulmonary Disease

## 2024-11-18 DIAGNOSIS — J4531 Mild persistent asthma with (acute) exacerbation: Secondary | ICD-10-CM

## 2024-11-18 NOTE — Telephone Encounter (Signed)
 I called and spoke with the pt and pt originally wanted order sent to [facility] instead of [facility] because she would get it cheaper.  After [facility] spoke with the pt they advised she would have to pay more through them than at [facility].  I am placing new neb order, please send to [facility] ASAP.

## 2024-11-18 NOTE — Telephone Encounter (Signed)
 PCCs, Can someone explain to this patient why this nebulizer was ordered through [facility] instead of [facility]?  I am thinking it is an insurance issue.  Please call and discuss matter with patient.  Thank you.

## 2024-11-18 NOTE — Telephone Encounter (Signed)
 This Pt has called several time regarding her nebulizer. She said the order for the new nebulizer should have been placed with [facility], the same company that she received her first nebulizer. Looks like according to previous encounters it was ordered through another company.  Please contact Pt she has been without a nebulizer for over a week.  Thank-you

## 2024-11-19 ENCOUNTER — Telehealth: Payer: Self-pay | Admitting: Pulmonary Disease

## 2024-11-19 NOTE — Telephone Encounter (Signed)
 Per Dolanda at [facility]   need f44f note or md note to document need for nebulizer for insurance

## 2024-11-20 ENCOUNTER — Telehealth: Payer: Self-pay | Admitting: Pulmonary Disease

## 2024-11-20 NOTE — Telephone Encounter (Signed)
 Could someone please add this to the note?

## 2024-11-20 NOTE — Telephone Encounter (Signed)
 Patient with obstructive sleep apnea, severe obstructive disease for which she uses nebulization treatment on a regular basis  Machine is dysfunctional  Request for new nebulizer machine  Order placed for new machine  Patient benefits from nebulization medications and uses this on a regular basis historically  May add this note onto last office visit 12/19/2023

## 2024-11-21 ENCOUNTER — Telehealth: Payer: Self-pay | Admitting: Pulmonary Disease

## 2024-11-21 NOTE — Telephone Encounter (Signed)
 PCCs, Has this been taken care of, can we close this encounter?  Thank you.

## 2024-11-21 NOTE — Telephone Encounter (Signed)
Order was sent in this morning

## 2024-11-21 NOTE — Telephone Encounter (Signed)
 Fax received from Dr. Elspeth Her with [facility] to perform a total left knee arthoplasty on patient.  Patient needs surgery clearance. Surgery is pending. Patient was seen on 02/06/24. Office protocol is a risk assessment can be sent to surgeon if patient has been seen in 60 days or less.   Pt needs appt for clearance.  Called her and there was no answer- LMTCB

## 2024-11-21 NOTE — Telephone Encounter (Signed)
 Order was sent to [facility]. NFN

## 2024-11-21 NOTE — Telephone Encounter (Signed)
 Looks like order was sent this morning.NFN

## 2024-11-26 ENCOUNTER — Telehealth: Payer: Self-pay | Admitting: *Deleted

## 2024-11-26 ENCOUNTER — Telehealth: Payer: Self-pay

## 2024-11-26 NOTE — Telephone Encounter (Signed)
 Copied from CRM [id number]. Topic: General - Other >> Nov 26, 2024 12:13 PM Dedra B wrote: Reason for CRM: Patient returning call for [facility] regarding surgical clearance. Called [facility] and was transferred but no answer. Please call patient back. She would like you to call both numbers.  Duplicate

## 2024-11-26 NOTE — Telephone Encounter (Signed)
 Copied from CRM #[id number]. Topic: Appointments - Scheduling Inquiry for Clinic >> Nov 24, 2024 12:47 PM Devaughn RAMAN wrote: Reason for CRM: Pt called regarding surgical clearance, advised pt per Sonny pt needs an appt for surgical clearance. Pt stated she is not coming in for an appt when she just needs a surgical clearance, pt very upset and requested to speak with an print production planner, contacted [facility] [place] spoke with Luke and she advised no print production planner in office and the clinical staff will f/u with her. Advised pt. >> Nov 24, 2024  1:29 PM Russell PARAS wrote: Pt is contacting clinic regarding needing her surgical clearance for knee replacement surgery in 01/2025.   She is very upset that she is not being provided clearance, even though she was seen in 01/2024 and was fine. Advised clinic did attempt to reach out when clearance form was received; however, pt denies ever getting call.   Advised of office protocol for surgical clearance and offered to schedule appt;however, soonest avail with MD was 02/2025 or 12/2024 for APP.  She refused to schedule appt and said she needs to be seen sooner than 12/2024 bc she will lose her spot for surgery, with next avail surgery date being end of 2026.   Requested call back   CB#  [phone]   Tried to reach out to patient VM/LM - return call   ( Per phone note 11/21/24 patient needs an appointment with a provider for surgical clearance - LOV was in march of 2025 )

## 2024-11-26 NOTE — Telephone Encounter (Signed)
 Copied from CRM #[id number]. Topic: Appointments - Scheduling Inquiry for Clinic >> Nov 24, 2024 12:47 PM Devaughn RAMAN wrote: Reason for CRM: Pt called regarding surgical clearance, advised pt per Sonny pt needs an appt for surgical clearance. Pt stated she is not coming in for an appt when she just needs a surgical clearance, pt very upset and requested to speak with an print production planner, contacted [facility] [place] spoke with Luke and she advised no print production planner in office and the clinical staff will f/u with her. Advised pt. >> Nov 24, 2024  1:29 PM Russell PARAS wrote: Pt is contacting clinic regarding needing her surgical clearance for knee replacement surgery in 01/2025.    She is very upset that she is not being provided clearance, even though she was seen in 01/2024 and was fine. Advised clinic did attempt to reach out when clearance form was received; however, pt denies ever getting call.    Advised of office protocol for surgical clearance and offered to schedule appt;however, soonest avail with MD was 02/2025 or 12/2024 for APP.   She refused to schedule appt and said she needs to be seen sooner than 12/2024 bc she will lose her spot for surgery, with next avail surgery date being end of 2026.    Requested call back    CB#   [phone]     Tried to reach out to patient VM/LM - return call    ( Per phone note 11/21/24 patient needs an appointment with a provider for surgical clearance - LOV was in march of 2025 )     I called pt 10 x in a row at the number provided and the line rings busy each time  Dr MALVA has opening 2 days from now- she could see him then or see APP sooner  Will keep trying to reach her

## 2024-11-26 NOTE — Telephone Encounter (Signed)
 See TE 11/21/24

## 2024-11-27 NOTE — Telephone Encounter (Signed)
 Spoke with the pt and scheduler her to see Dr. Neda at 1:30 on Monday 12/01/24.

## 2024-12-01 ENCOUNTER — Telehealth: Payer: Self-pay | Admitting: Pulmonary Disease

## 2024-12-01 ENCOUNTER — Ambulatory Visit: Admitting: Pulmonary Disease

## 2024-12-01 ENCOUNTER — Encounter: Payer: Self-pay | Admitting: Pulmonary Disease

## 2024-12-01 VITALS — BP 165/105 | HR 58 | Temp 97.7°F | Ht 66.0 in | Wt 190.0 lb

## 2024-12-01 DIAGNOSIS — J42 Unspecified chronic bronchitis: Secondary | ICD-10-CM

## 2024-12-01 DIAGNOSIS — Z01811 Encounter for preprocedural respiratory examination: Secondary | ICD-10-CM | POA: Diagnosis not present

## 2024-12-01 DIAGNOSIS — Z87891 Personal history of nicotine dependence: Secondary | ICD-10-CM

## 2024-12-01 DIAGNOSIS — G4733 Obstructive sleep apnea (adult) (pediatric): Secondary | ICD-10-CM | POA: Diagnosis not present

## 2024-12-01 DIAGNOSIS — J4540 Moderate persistent asthma, uncomplicated: Secondary | ICD-10-CM

## 2024-12-01 NOTE — Telephone Encounter (Signed)
 Note was routed to Marcey Her, MD via epic. nfn

## 2024-12-01 NOTE — Telephone Encounter (Signed)
 Surgical clearance  Patient for arthroplasty  She has a history of asthma, obstructive sleep apnea  Has not been using CPAP for a year No significant exacerbation of her asthma Compliant with nebulization treatments with DuoNeb and Pulmicort , on Trelegy  She is cleared for surgery, low to intermediate risk of respiratory complications including atelectasis, respiratory failure  She has preclusions to surgery, tolerated arthroplasty september 2025  Aggressive pulmonary toileting Encouraged to continue bronchodilator treatments   Cleared for surgery
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
## 2024-12-01 NOTE — Patient Instructions (Signed)
 She is cleared for surgery from a respiratory perspective  Recently had surgery and did well  Once you establish care in another office, you can request for your records and this will be for next  You are welcome to follow back with us  in about 6 months

## 2024-12-04 NOTE — Telephone Encounter (Signed)
 Copy of risk assessment 12/01/24 was faxed to [facility].

## 2024-12-09 ENCOUNTER — Other Ambulatory Visit: Payer: Self-pay | Admitting: Pulmonary Disease

## 2025-02-06 ENCOUNTER — Ambulatory Visit (HOSPITAL_COMMUNITY): Admit: 2025-02-06 | Admitting: Orthopedic Surgery

## 2025-02-06 SURGERY — ARTHROPLASTY, KNEE, TOTAL
Anesthesia: Choice | Site: Knee | Laterality: Left
